# Patient Record
Sex: Female | Born: 1955 | Race: White | Hispanic: No | Smoking: Never smoker
Health system: Southern US, Community
[De-identification: ages and names within clinical notes are randomized; demographics above are authoritative.]

## PROBLEM LIST (undated history)

## (undated) DIAGNOSIS — K621 Rectal polyp: Secondary | ICD-10-CM

## (undated) DIAGNOSIS — L570 Actinic keratosis: Secondary | ICD-10-CM

## (undated) DIAGNOSIS — S92321A Displaced fracture of second metatarsal bone, right foot, initial encounter for closed fracture: Secondary | ICD-10-CM

## (undated) DIAGNOSIS — C4491 Basal cell carcinoma of skin, unspecified: Secondary | ICD-10-CM

## (undated) DIAGNOSIS — K589 Irritable bowel syndrome without diarrhea: Secondary | ICD-10-CM

## (undated) DIAGNOSIS — G35 Multiple sclerosis: Secondary | ICD-10-CM

## (undated) DIAGNOSIS — S92331D Displaced fracture of third metatarsal bone, right foot, subsequent encounter for fracture with routine healing: Secondary | ICD-10-CM

## (undated) DIAGNOSIS — N390 Urinary tract infection, site not specified: Secondary | ICD-10-CM

## (undated) DIAGNOSIS — N319 Neuromuscular dysfunction of bladder, unspecified: Secondary | ICD-10-CM

## (undated) DIAGNOSIS — C4492 Squamous cell carcinoma of skin, unspecified: Secondary | ICD-10-CM

## (undated) HISTORY — PX: BREAST BIOPSY: SHX20

## (undated) HISTORY — DX: Irritable bowel syndrome without diarrhea: K58.9

## (undated) HISTORY — DX: Basal cell carcinoma of skin, unspecified: C44.91

## (undated) HISTORY — PX: SKIN CANCER EXCISION: SHX779

## (undated) HISTORY — DX: Neuromuscular dysfunction of bladder, unspecified: N31.9

## (undated) HISTORY — DX: Urinary tract infection, site not specified: N39.0

## (undated) HISTORY — DX: Actinic keratosis: L57.0

## (undated) HISTORY — PX: BOTOX INJECTION: SHX5754

## (undated) HISTORY — PX: COLONOSCOPY: SHX174

## (undated) HISTORY — DX: Displaced fracture of second metatarsal bone, right foot, initial encounter for closed fracture: S92.321A

## (undated) HISTORY — DX: Displaced fracture of third metatarsal bone, right foot, subsequent encounter for fracture with routine healing: S92.331D

## (undated) HISTORY — DX: Squamous cell carcinoma of skin, unspecified: C44.92

## (undated) HISTORY — DX: Rectal polyp: K62.1

---

## 2011-05-21 DIAGNOSIS — K589 Irritable bowel syndrome without diarrhea: Secondary | ICD-10-CM | POA: Insufficient documentation

## 2013-05-23 DIAGNOSIS — E785 Hyperlipidemia, unspecified: Secondary | ICD-10-CM | POA: Insufficient documentation

## 2018-06-03 DIAGNOSIS — N319 Neuromuscular dysfunction of bladder, unspecified: Secondary | ICD-10-CM | POA: Insufficient documentation

## 2019-07-29 DIAGNOSIS — Z7409 Other reduced mobility: Secondary | ICD-10-CM | POA: Insufficient documentation

## 2019-07-29 DIAGNOSIS — I10 Essential (primary) hypertension: Secondary | ICD-10-CM | POA: Insufficient documentation

## 2019-07-29 DIAGNOSIS — Z789 Other specified health status: Secondary | ICD-10-CM | POA: Insufficient documentation

## 2019-10-01 ENCOUNTER — Encounter (HOSPITAL_BASED_OUTPATIENT_CLINIC_OR_DEPARTMENT_OTHER): Payer: Self-pay | Admitting: Emergency Medicine

## 2019-10-01 ENCOUNTER — Emergency Department (HOSPITAL_BASED_OUTPATIENT_CLINIC_OR_DEPARTMENT_OTHER): Payer: Medicare Other

## 2019-10-01 ENCOUNTER — Other Ambulatory Visit: Payer: Self-pay

## 2019-10-01 ENCOUNTER — Emergency Department (HOSPITAL_BASED_OUTPATIENT_CLINIC_OR_DEPARTMENT_OTHER)
Admission: EM | Admit: 2019-10-01 | Discharge: 2019-10-02 | Disposition: A | Discharge status: Other Acute Inpatient Hospital | Payer: Medicare Other | Attending: Emergency Medicine | Admitting: Emergency Medicine

## 2019-10-01 DIAGNOSIS — R000 Tachycardia, unspecified: Secondary | ICD-10-CM | POA: Diagnosis not present

## 2019-10-01 DIAGNOSIS — G35 Multiple sclerosis: Secondary | ICD-10-CM | POA: Diagnosis not present

## 2019-10-01 DIAGNOSIS — R112 Nausea with vomiting, unspecified: Secondary | ICD-10-CM | POA: Diagnosis not present

## 2019-10-01 DIAGNOSIS — Z20822 Contact with and (suspected) exposure to covid-19: Secondary | ICD-10-CM | POA: Insufficient documentation

## 2019-10-01 DIAGNOSIS — R69 Illness, unspecified: Secondary | ICD-10-CM

## 2019-10-01 DIAGNOSIS — N218 Other lower urinary tract calculus: Secondary | ICD-10-CM | POA: Diagnosis not present

## 2019-10-01 DIAGNOSIS — A419 Sepsis, unspecified organism: Secondary | ICD-10-CM | POA: Diagnosis not present

## 2019-10-01 DIAGNOSIS — R350 Frequency of micturition: Secondary | ICD-10-CM | POA: Diagnosis present

## 2019-10-01 DIAGNOSIS — N390 Urinary tract infection, site not specified: Secondary | ICD-10-CM

## 2019-10-01 HISTORY — DX: Multiple sclerosis: G35

## 2019-10-01 LAB — COMPREHENSIVE METABOLIC PANEL
ALT: 15 U/L (ref 0–44)
AST: 18 U/L (ref 15–41)
Albumin: 3.7 g/dL (ref 3.5–5.0)
Alkaline Phosphatase: 52 U/L (ref 38–126)
Anion gap: 12 (ref 5–15)
BUN: 10 mg/dL (ref 8–23)
CO2: 25 mmol/L (ref 22–32)
Calcium: 9.4 mg/dL (ref 8.9–10.3)
Chloride: 106 mmol/L (ref 98–111)
Creatinine, Ser: 0.88 mg/dL (ref 0.44–1.00)
GFR calc Af Amer: >60 mL/min (ref >60)
GFR calc non Af Amer: >60 mL/min (ref >60)
Glucose, Bld: 116 mg/dL — ABNORMAL HIGH (ref 70–99)
Potassium: 3.4 mmol/L — ABNORMAL LOW (ref 3.5–5.1)
Sodium: 143 mmol/L (ref 135–145)
Total Bilirubin: 0.7 mg/dL (ref 0.3–1.2)
Total Protein: 7.1 g/dL (ref 6.5–8.1)

## 2019-10-01 LAB — CBC WITH DIFFERENTIAL/PLATELET
Abs Immature Granulocytes: 0.12 10*3/uL — ABNORMAL HIGH (ref 0.00–0.07)
Basophils Absolute: 0.1 10*3/uL (ref 0.0–0.1)
Basophils Relative: 0 %
Eosinophils Absolute: 0.1 10*3/uL (ref 0.0–0.5)
Eosinophils Relative: 0 %
HCT: 37.8 % (ref 36.0–46.0)
Hemoglobin: 11.7 g/dL — ABNORMAL LOW (ref 12.0–15.0)
Immature Granulocytes: 1 %
Lymphocytes Relative: 4 %
Lymphs Abs: 0.8 10*3/uL (ref 0.7–4.0)
MCH: 26.7 pg (ref 26.0–34.0)
MCHC: 31 g/dL (ref 30.0–36.0)
MCV: 86.3 fL (ref 80.0–100.0)
Monocytes Absolute: 1.7 10*3/uL — ABNORMAL HIGH (ref 0.1–1.0)
Monocytes Relative: 8 %
Neutro Abs: 18.9 10*3/uL — ABNORMAL HIGH (ref 1.7–7.7)
Neutrophils Relative %: 87 %
Platelets: 335 10*3/uL (ref 150–400)
RBC: 4.38 MIL/uL (ref 3.87–5.11)
RDW: 15.8 % — ABNORMAL HIGH (ref 11.5–15.5)
WBC: 21.6 10*3/uL — ABNORMAL HIGH (ref 4.0–10.5)
nRBC: 0 % (ref 0.0–0.2)

## 2019-10-01 LAB — PROTIME-INR
INR: 0.9 (ref 0.8–1.2)
Prothrombin Time: 12.5 s (ref 11.4–15.2)

## 2019-10-01 LAB — LACTIC ACID, PLASMA: Lactic Acid, Venous: 1.2 mmol/L (ref 0.5–1.9)

## 2019-10-01 LAB — APTT: aPTT: 28 seconds (ref 24–36)

## 2019-10-01 LAB — BRAIN NATRIURETIC PEPTIDE: B Natriuretic Peptide: 169 pg/mL — ABNORMAL HIGH (ref 0.0–100.0)

## 2019-10-01 LAB — LIPASE, BLOOD: Lipase: 38 U/L (ref 11–51)

## 2019-10-01 LAB — TROPONIN I (HIGH SENSITIVITY): Troponin I (High Sensitivity): 4 ng/L (ref <18)

## 2019-10-01 MED ORDER — LACTATED RINGERS IV BOLUS (SEPSIS): 1000.0000 mL | Freq: Once | INTRAVENOUS | Status: DC

## 2019-10-01 MED ORDER — ACETAMINOPHEN 650 MG RE SUPP
650.0000 mg | Freq: Once | RECTAL | Status: AC
  Administered 2019-10-01: 650 mg via RECTAL
  Filled 2019-10-01: qty 1

## 2019-10-01 MED ORDER — LACTATED RINGERS IV BOLUS (SEPSIS)
1000.0000 mL | Freq: Once | INTRAVENOUS | Status: AC
  Administered 2019-10-02: 01:00:00 1000 mL via INTRAVENOUS

## 2019-10-01 MED ORDER — LACTATED RINGERS IV BOLUS
1000.0000 mL | Freq: Once | INTRAVENOUS | Status: AC
  Administered 2019-10-01: 23:00:00 1000 mL via INTRAVENOUS

## 2019-10-01 MED ORDER — LACTATED RINGERS IV BOLUS (SEPSIS): 250.0000 mL | Freq: Once | INTRAVENOUS | Status: DC

## 2019-10-01 MED ORDER — SODIUM CHLORIDE 0.9 % IV SOLN
1.0000 g | Freq: Once | INTRAVENOUS | Status: AC
  Administered 2019-10-01: 1 g via INTRAVENOUS
  Filled 2019-10-01: qty 10

## 2019-10-01 NOTE — ED Provider Notes (Signed)
[facility] EMERGENCY DEPARTMENT Provider Note   CSN: [id number] Arrival date & time: 10/01/19  2041     History Chief Complaint  Patient presents with  . Emesis  . Urinary Frequency    Renee Huang is a 64 y.o. female.  HPI Patient is a resident at [facility].  She has multiple sclerosis.  Patient reports she has had urinary tract infections in the past and had similar symptoms.  She reports she started having a lot of urinary frequency and incontinence.  She reports she was waking up in pools of urine.  She reports she has had nausea and vomiting with general weakness and back pain.  She denies cough or shortness of breath.  She reports that multiple residents at the facility had fever and cough but to this point, they have tested negative for Covid.  She reports she had a negative Covid test within the past 2 weeks.    Past Medical History:  Diagnosis Date  . Multiple sclerosis ([place])     There are no problems to display for this patient.      OB History   No obstetric history on file.     No family history on file.  Social History   Tobacco Use  . Smoking status: Never Smoker  Substance Use Topics  . Alcohol use: Yes  . Drug use: Never    Home Medications Prior to Admission medications   Not on File    Allergies    Patient has no allergy information on record.  Review of Systems   Review of Systems 10 Systems reviewed and are negative for acute change except as noted in the HPI.  Physical Exam Updated Vital Signs BP (!) 180/98   Pulse (!) 116   Temp (!) 101.2 F (38.4 C) (Oral)   Resp (!) 27   Ht 5\' 5"  (1.651 m)   Wt 72.6 kg   SpO2 93%   BMI 26.63 kg/m   Physical Exam Constitutional:      Comments: Patient is alert.  No respiratory distress.  She is ill and uncomfortable in appearance.  She is holding an emesis bag and intermittently has dry heaves.  HENT:     Head: Normocephalic and atraumatic.   Mouth/Throat:     Mouth: Mucous membranes are moist.     Pharynx: Oropharynx is clear.  Eyes:     Extraocular Movements: Extraocular movements intact.     Conjunctiva/sclera: Conjunctivae normal.  Cardiovascular:     Comments: Tachycardia.  No gross rub murmur gallop. Pulmonary:     Comments: No respiratory distress.  Lungs are grossly clear. Abdominal:     Comments: Abdomen is soft.  Patient denies significant pain to palpation.  No guarding.  Musculoskeletal:     Comments: Patient does not have any significant peripheral edema.  Her feet are warm and dry.  Dorsalis pedis pulses are 2+.  Skin condition of the feet and legs is good.  She does not have any wounds or breakdown.  Skin:    General: Skin is warm and dry.  Neurological:     Comments: Alert and oriented x3.  Patient has quite a bit of muscle stiffness at this point.  She is very stiff for even sitting forward.  Her legs are quite stiff and difficult to raise.  She reports this is partially the effect of not having had baclofen for several days.  Psychiatric:        Mood and Affect:  Mood normal.     ED Results / Procedures / Treatments   Labs (all labs ordered are listed, but only abnormal results are displayed) Labs Reviewed  COMPREHENSIVE METABOLIC PANEL - Abnormal; Notable for the following components:      Result Value   Potassium 3.4 (*)    Glucose, Bld 116 (*)    All other components within normal limits  BRAIN NATRIURETIC PEPTIDE - Abnormal; Notable for the following components:   B Natriuretic Peptide 169.0 (*)    All other components within normal limits  CBC WITH DIFFERENTIAL/PLATELET - Abnormal; Notable for the following components:   WBC 21.6 (*)    Hemoglobin 11.7 (*)    RDW 15.8 (*)    Neutro Abs 18.9 (*)    Monocytes Absolute 1.7 (*)    Abs Immature Granulocytes 0.12 (*)    All other components within normal limits  CULTURE, BLOOD (ROUTINE X 2)  CULTURE, BLOOD (ROUTINE X 2)  SARS CORONAVIRUS 2 AG  (30 MIN TAT)  LIPASE, BLOOD  LACTIC ACID, PLASMA  PROTIME-INR  LACTIC ACID, PLASMA  URINALYSIS, ROUTINE W REFLEX MICROSCOPIC  APTT  TROPONIN I (HIGH SENSITIVITY)    EKG None EKG is not imported into MUSE. Sinus rhythm 116.  PR 131.  QRS 128.  QTc 481. Right bundle branch block.  No STEMI.  No old comparison. Radiology DG Chest Port 1 View  Result Date: 10/01/2019 CLINICAL DATA:  Back pain, nausea and vomiting, weakness, hypoxia EXAM: PORTABLE CHEST 1 VIEW COMPARISON:  07/23/2019 FINDINGS: Single frontal view of the chest demonstrates a stable cardiac silhouette. No airspace disease, effusion, or pneumothorax. No acute bony abnormalities. IMPRESSION: 1. Stable exam, no acute process. Electronically Signed   By: Randa Ngo M.D.   On: 10/01/2019 22:24    Procedures Procedures (including critical care time) CRITICAL CARE Performed by: Charlesetta Shanks   Total critical care time: 30 minutes  Critical care time was exclusive of separately billable procedures and treating other patients.  Critical care was necessary to treat or prevent imminent or life-threatening deterioration.  Critical care was time spent personally by me on the following activities: development of treatment plan with patient and/or surrogate as well as nursing, discussions with consultants, evaluation of patient's response to treatment, examination of patient, obtaining history from patient or surrogate, ordering and performing treatments and interventions, ordering and review of laboratory studies, ordering and review of radiographic studies, pulse oximetry and re-evaluation of patient's condition. Medications Ordered in ED Medications  cefTRIAXone (ROCEPHIN) 1 g in sodium chloride 0.9 % 100 mL IVPB (has no administration in time range)  acetaminophen (TYLENOL) suppository 650 mg (has no administration in time range)  lactated ringers bolus 1,000 mL (has no administration in time range)    And  lactated  ringers bolus 1,000 mL (has no administration in time range)    And  lactated ringers bolus 250 mL (has no administration in time range)  lactated ringers bolus 1,000 mL (1,000 mLs Intravenous New Bag/Given 10/01/19 2259)    ED Course  I have reviewed the triage vital signs and the nursing notes.  Pertinent labs & imaging results that were available during my care of the patient were reviewed by me and considered in my medical decision making (see chart for details).    MDM Rules/Calculators/A&P                      Patient presents lying above.  She is febrile.  She  has tachycardia.  Significant leukocytosis.  Chest x-ray is clear.  Urinalysis is pending.  Nursing staff is having to obtain via cath.  I will empirically start Rocephin with very high probability of UTI.  Will initiate sepsis protocol.  Patient will need to be admitted.  Covid testing is pending at this time.  Patient does not have chest congestion or pneumonia by chest x-ray.  Lower suspicion for Covid at this time.  She is however in a congregate living situation. Final Clinical Impression(s) / ED Diagnoses Final diagnoses:  Sepsis without acute organ dysfunction, due to unspecified organism [facility])  Severe comorbid illness    Rx / DC Orders ED Discharge Orders    None       Charlesetta Shanks, MD 10/01/19 2320

## 2019-10-01 NOTE — ED Triage Notes (Addendum)
Pt arrives via EMS from [facility]  c/o back pain, N/V, weakness and urinary frequency x 2 days. Pt reports hx of uti.

## 2019-10-01 NOTE — ED Notes (Signed)
ED Provider at bedside. Dr. Zoe Lan

## 2019-10-02 LAB — URINALYSIS, ROUTINE W REFLEX MICROSCOPIC
Bilirubin Urine: NEGATIVE
Glucose, UA: NEGATIVE mg/dL
Ketones, ur: NEGATIVE mg/dL
Nitrite: NEGATIVE
Protein, ur: NEGATIVE mg/dL
Specific Gravity, Urine: 1.01 (ref 1.005–1.030)
pH: 6.5 (ref 5.0–8.0)

## 2019-10-02 LAB — SARS CORONAVIRUS 2 AG (30 MIN TAT): SARS Coronavirus 2 Ag: NEGATIVE

## 2019-10-02 LAB — HEPATIC FUNCTION PANEL
ALT: 10 (ref 7–35)
AST: 14 (ref 13–35)
Alkaline Phosphatase: 46 (ref 25–125)
Bilirubin, Direct: 0.1 (ref 0.01–0.4)
Bilirubin, Total: 0.5

## 2019-10-02 LAB — TROPONIN I (HIGH SENSITIVITY): Troponin I (High Sensitivity): 9 ng/L (ref <18)

## 2019-10-02 LAB — URINALYSIS, MICROSCOPIC (REFLEX)

## 2019-10-02 LAB — COMPREHENSIVE METABOLIC PANEL: Albumin: 3.6 (ref 3.5–5.0)

## 2019-10-02 LAB — SARS CORONAVIRUS 2 BY RT PCR (HOSPITAL ORDER, PERFORMED IN ~~LOC~~ HOSPITAL LAB): SARS Coronavirus 2: NEGATIVE

## 2019-10-02 LAB — LACTIC ACID, PLASMA: Lactic Acid, Venous: 1.4 mmol/L (ref 0.5–1.9)

## 2019-10-02 MED ORDER — DIAZEPAM 5 MG/ML IJ SOLN
5.0000 mg | Freq: Once | INTRAMUSCULAR | Status: AC
  Administered 2019-10-02: 02:00:00 5 mg via INTRAVENOUS
  Filled 2019-10-02: qty 2

## 2019-10-02 NOTE — ED Notes (Signed)
[facility] notified (Tammy) - patient ready for transport  Contacted [facility] for transport for patient

## 2019-10-02 NOTE — ED Notes (Signed)
Pt transported to [facility] by way of [facility]. RN Reginal at [facility] called to inform her that the pt is on her way.

## 2019-10-02 NOTE — ED Provider Notes (Addendum)
Nursing notes and vitals signs, including pulse oximetry, reviewed.  Summary of this visit's results, reviewed by myself:  EKG:  EKG Interpretation  Date/Time:  Saturday October 01 2019 22:50:46 EST Ventricular Rate:  116 PR Interval:    QRS Duration: 128 QT Interval:  346 QTC Calculation: 481 R Axis:   16 Text Interpretation: Sinus tachycardia Probable left atrial enlargement Right bundle branch block No previous ECGs available Confirmed by Jolyne Laye, Jenny Reichmann [phone]) on 10/02/2019 2:34:11 AM       Labs:  Results for orders placed or performed during the hospital encounter of 10/01/19 (from the past 24 hour(s))  Comprehensive metabolic panel     Status: Abnormal   Collection Time: 10/01/19  9:51 PM  Result Value Ref Range   Sodium 143 135 - 145 mmol/L   Potassium 3.4 (L) 3.5 - 5.1 mmol/L   Chloride 106 98 - 111 mmol/L   CO2 25 22 - 32 mmol/L   Glucose, Bld 116 (H) 70 - 99 mg/dL   BUN 10 8 - 23 mg/dL   Creatinine, Ser 0.88 0.44 - 1.00 mg/dL   Calcium 9.4 8.9 - 10.3 mg/dL   Total Protein 7.1 6.5 - 8.1 g/dL   Albumin 3.7 3.5 - 5.0 g/dL   AST 18 15 - 41 U/L   ALT 15 0 - 44 U/L   Alkaline Phosphatase 52 38 - 126 U/L   Total Bilirubin 0.7 0.3 - 1.2 mg/dL   GFR calc non Af Amer >60 >60 mL/min   GFR calc Af Amer >60 >60 mL/min   Anion gap 12 5 - 15  Lipase, blood     Status: None   Collection Time: 10/01/19  9:51 PM  Result Value Ref Range   Lipase 38 11 - 51 U/L  CBC with Differential     Status: Abnormal   Collection Time: 10/01/19  9:51 PM  Result Value Ref Range   WBC 21.6 (H) 4.0 - 10.5 K/uL   RBC 4.38 3.87 - 5.11 MIL/uL   Hemoglobin 11.7 (L) 12.0 - 15.0 g/dL   HCT 37.8 36.0 - 46.0 %   MCV 86.3 80.0 - 100.0 fL   MCH 26.7 26.0 - 34.0 pg   MCHC 31.0 30.0 - 36.0 g/dL   RDW 15.8 (H) 11.5 - 15.5 %   Platelets 335 150 - 400 K/uL   nRBC 0.0 0.0 - 0.2 %   Neutrophils Relative % 87 %   Neutro Abs 18.9 (H) 1.7 - 7.7 K/uL   Lymphocytes Relative 4 %   Lymphs Abs 0.8 0.7 - 4.0  K/uL   Monocytes Relative 8 %   Monocytes Absolute 1.7 (H) 0.1 - 1.0 K/uL   Eosinophils Relative 0 %   Eosinophils Absolute 0.1 0.0 - 0.5 K/uL   Basophils Relative 0 %   Basophils Absolute 0.1 0.0 - 0.1 K/uL   Immature Granulocytes 1 %   Abs Immature Granulocytes 0.12 (H) 0.00 - 0.07 K/uL  Protime-INR     Status: None   Collection Time: 10/01/19  9:51 PM  Result Value Ref Range   Prothrombin Time 12.5 11.4 - 15.2 seconds   INR 0.9 0.8 - 1.2  APTT     Status: None   Collection Time: 10/01/19  9:51 PM  Result Value Ref Range   aPTT 28 24 - 36 seconds  Brain natriuretic peptide     Status: Abnormal   Collection Time: 10/01/19  9:52 PM  Result Value Ref Range   B Natriuretic  Peptide 169.0 (H) 0.0 - 100.0 pg/mL  Troponin I (High Sensitivity)     Status: None   Collection Time: 10/01/19  9:52 PM  Result Value Ref Range   Troponin I (High Sensitivity) 4 <18 ng/L  Lactic acid, plasma     Status: None   Collection Time: 10/01/19  9:52 PM  Result Value Ref Range   Lactic Acid, Venous 1.2 0.5 - 1.9 mmol/L  SARS Coronavirus 2 Ag (30 min TAT) - Nasal Swab (BD Veritor Kit)     Status: None   Collection Time: 10/01/19 11:50 PM   Specimen: Nasal Swab (BD Veritor Kit)  Result Value Ref Range   SARS Coronavirus 2 Ag NEGATIVE NEGATIVE  Urinalysis, Routine w reflex microscopic     Status: Abnormal   Collection Time: 10/01/19 11:58 PM  Result Value Ref Range   Color, Urine YELLOW YELLOW   APPearance CLOUDY (A) CLEAR   Specific Gravity, Urine 1.010 1.005 - 1.030   pH 6.5 5.0 - 8.0   Glucose, UA NEGATIVE NEGATIVE mg/dL   Hgb urine dipstick TRACE (A) NEGATIVE   Bilirubin Urine NEGATIVE NEGATIVE   Ketones, ur NEGATIVE NEGATIVE mg/dL   Protein, ur NEGATIVE NEGATIVE mg/dL   Nitrite NEGATIVE NEGATIVE   Leukocytes,Ua TRACE (A) NEGATIVE  Urinalysis, Microscopic (reflex)     Status: Abnormal   Collection Time: 10/01/19 11:58 PM  Result Value Ref Range   RBC / HPF 0-5 0 - 5 RBC/hpf   WBC, UA  11-20 0 - 5 WBC/hpf   Bacteria, UA MANY (A) NONE SEEN   Squamous Epithelial / LPF 0-5 0 - 5  Lactic acid, plasma     Status: None   Collection Time: 10/02/19 12:48 AM  Result Value Ref Range   Lactic Acid, Venous 1.4 0.5 - 1.9 mmol/L  Troponin I (High Sensitivity)     Status: None   Collection Time: 10/02/19 12:48 AM  Result Value Ref Range   Troponin I (High Sensitivity) 9 <18 ng/L  SARS Coronavirus 2 by RT PCR (hospital order, performed in [place] hospital lab)     Status: None   Collection Time: 10/02/19  1:20 AM  Result Value Ref Range   SARS Coronavirus 2 NEGATIVE NEGATIVE    Imaging Studies: DG Chest Port 1 View  Result Date: 10/01/2019 CLINICAL DATA:  Back pain, nausea and vomiting, weakness, hypoxia EXAM: PORTABLE CHEST 1 VIEW COMPARISON:  07/23/2019 FINDINGS: Single frontal view of the chest demonstrates a stable cardiac silhouette. No airspace disease, effusion, or pneumothorax. No acute bony abnormalities. IMPRESSION: 1. Stable exam, no acute process. Electronically Signed   By: Randa Ngo M.D.   On: 10/01/2019 22:24   2:31 AM Dr. Dwyane Dee accepts patient for admission to [facility].    [place], Jenny Reichmann, MD 10/02/19 0232    Shanon Rosser, MD 10/02/19 [phone]

## 2019-10-03 LAB — URINE CULTURE

## 2019-10-07 LAB — CULTURE, BLOOD (ROUTINE X 2)
Culture: NO GROWTH
Culture: NO GROWTH
Special Requests: ADEQUATE

## 2019-10-10 LAB — BASIC METABOLIC PANEL
BUN: 18 (ref 4–21)
CO2: 30 — AB (ref 13–22)
Chloride: 102 (ref 99–108)
Creatinine: 0.8 (ref 0.5–1.1)
Potassium: 5 (ref 3.4–5.3)
Sodium: 140 (ref 137–147)

## 2019-10-10 LAB — CBC AND DIFFERENTIAL
HCT: 40 (ref 36–46)
Hemoglobin: 13.1 (ref 12.0–16.0)
Platelets: 391 (ref 150–399)
WBC: 9.8

## 2019-10-10 LAB — CBC: RBC: 4.84 (ref 3.87–5.11)

## 2019-10-10 LAB — COMPREHENSIVE METABOLIC PANEL: Calcium: 9.4 (ref 8.7–10.7)

## 2019-10-11 ENCOUNTER — Non-Acute Institutional Stay (SKILLED_NURSING_FACILITY): Payer: Medicare Other | Admitting: Internal Medicine

## 2019-10-11 ENCOUNTER — Encounter: Payer: Self-pay | Admitting: Internal Medicine

## 2019-10-11 DIAGNOSIS — G35D Multiple sclerosis, unspecified: Secondary | ICD-10-CM

## 2019-10-11 DIAGNOSIS — E8779 Other fluid overload: Secondary | ICD-10-CM

## 2019-10-11 DIAGNOSIS — G35 Multiple sclerosis: Secondary | ICD-10-CM

## 2019-10-11 DIAGNOSIS — F32A Depression, unspecified: Secondary | ICD-10-CM

## 2019-10-11 DIAGNOSIS — F329 Major depressive disorder, single episode, unspecified: Secondary | ICD-10-CM

## 2019-10-11 DIAGNOSIS — A0472 Enterocolitis due to Clostridium difficile, not specified as recurrent: Secondary | ICD-10-CM | POA: Diagnosis not present

## 2019-10-11 DIAGNOSIS — A419 Sepsis, unspecified organism: Secondary | ICD-10-CM

## 2019-10-11 DIAGNOSIS — K580 Irritable bowel syndrome with diarrhea: Secondary | ICD-10-CM

## 2019-10-11 DIAGNOSIS — N390 Urinary tract infection, site not specified: Secondary | ICD-10-CM

## 2019-10-11 DIAGNOSIS — I10 Essential (primary) hypertension: Secondary | ICD-10-CM

## 2019-10-11 DIAGNOSIS — J90 Pleural effusion, not elsewhere classified: Secondary | ICD-10-CM

## 2019-10-11 DIAGNOSIS — E785 Hyperlipidemia, unspecified: Secondary | ICD-10-CM

## 2019-10-11 NOTE — Progress Notes (Signed)
: Provider:  Hennie Duos., MD Location:  [facility] Room Number: 507-P Place of Service:  SNF (31)  PCP: Patient, No Pcp Per Patient Care Team: Patient, No Pcp Per as PCP - General (General Practice)  Extended Emergency Contact Information Primary Emergency Contact: [name] Mobile Phone: [phone] Relation: Other Secondary Emergency Contact: [name] Mobile Phone: [phone] Relation: Other     Allergies: Patient has no known allergies.  Chief Complaint  Patient presents with  . New Admit To SNF    New admission to [facility] SNF    HPI: Patient is a 64 y.o. female with MS, neurogenic bladder, irritable bowel syndrome who is wheelchair-bound at baseline with history of recurrent UTI infection of C. difficile in January 2021 presented to [facility] with nausea vomiting diarrhea and fever 101.9 with urinary frequency since Friday 2/12.  Denied chills diaphoresis rigors abdominal pain cough shortness of breath.  Patient was admitted to [facility] from 2/14-22 patient was started on broad-spectrum IV antibiotics and p.o. vancomycin and C. difficile was positive.  Infectious disease was consulted and IV antibiotics were discontinued and transition to p.o. Macrobid bid.  CT of the abdomen pelvis did not show any significant colonic wall thickening, they resolved and her white counts were normal.  Macrobid was discontinued on 2/19 patient was continued on p.o. vancomycin 500 mg every 6 hours and was closely followed by infectious disease.  Hospital course was complicated fluid overload chest x-ray suggestive of interstitial edema and left-sided pleural effusion which was tapped was found to be in transudate.  2D echo done showed EF of 65% patient diuresed well and her fluid overload resolved.  Repeat-chest x-ray was normal and patient was weaned off of O2 and was saturating well on room air.  Patient's  potassium and magnesium was low and was repleted and her blood pressure was high and she was started on Norvasc.  Patient is admitted to skilled nursing facility for OT/PT.  While at skilled nursing facility patient will be followed for IBS treated with Bentyl, depression treated with Prozac and MS treated with ocrelizumab.  Past Medical History:  Diagnosis Date  . Actinic keratosis   . BCC (basal cell carcinoma of skin)   . Closed fracture of second metatarsal bone of right foot   . Closed fracture of third metatarsal bone of right foot with routine healing   . IBS (irritable bowel syndrome)   . Multiple sclerosis ([place])   . Neurogenic bladder   . Rectal polyp    Tubular adenoma  . Recurrent UTI   . SCC (squamous cell carcinoma)     Past Surgical History:  Procedure Laterality Date  . BOTOX INJECTION    . BREAST BIOPSY    . COLONOSCOPY    . SKIN CANCER EXCISION      Allergies as of 10/11/2019   No Known Allergies     Medication List       Accurate as of October 11, 2019  9:41 AM. If you have any questions, ask your nurse or doctor.        acetaminophen 325 MG tablet Commonly known as: TYLENOL Take 325 mg by mouth every 4 (four) hours as needed.   amLODipine 5 MG tablet Commonly known as: NORVASC Take 5 mg by mouth daily.   baclofen 10 MG tablet Commonly known as: LIORESAL Take 30 mg by mouth 3 (three) times daily. Take 3 tablets to =  30 mg   baclofen 20 MG tablet Commonly known as: LIORESAL Take 20 mg by mouth 3 (three) times daily.   cholecalciferol 25 MCG (1000 UNIT) tablet Commonly known as: VITAMIN D Take 1,000 Units by mouth daily.   Culturelle Caps Take 1 capsule by mouth daily. 10B cell   dicyclomine 20 MG tablet Commonly known as: BENTYL Take 20 mg by mouth in the morning, at noon, in the evening, and at bedtime.   Firvanq 50 MG/ML Solr Generic drug: Vancomycin HCl Take 5 mLs by mouth every 6 (six) hours.   FLUoxetine 20 MG capsule Commonly  known as: PROZAC Take 20 mg by mouth daily.   metoprolol succinate 25 MG 24 hr tablet Commonly known as: TOPROL-XL Take 25 mg by mouth daily.   nystatin ointment Commonly known as: MYCOSTATIN Apply 1 application topically 3 (three) times daily.   Ocrevus 300 MG/10ML injection Generic drug: ocrelizumab Inject 600 mg into the vein every 6 (six) months.   pravastatin 40 MG tablet Commonly known as: PRAVACHOL Take 40 mg by mouth daily.   tiZANidine 2 MG tablet Commonly known as: ZANAFLEX Take 2 mg by mouth at bedtime.   vitamin B-12 1000 MCG tablet Commonly known as: CYANOCOBALAMIN Take 1,000 mcg by mouth daily.       No orders of the defined types were placed in this encounter.   Immunization History  Administered Date(s) Administered  . Influenza, High Dose Seasonal PF 07/01/2018  . Influenza,inj,Quad PF,6+ Mos 05/20/2013, 05/26/2014, 06/18/2015, 06/18/2016, 06/29/2017, 06/20/2019  . Influenza-Unspecified 06/20/2019  . PPD Test 08/23/2019  . Td 08/18/1998  . Tdap 03/11/2012    Social History   Tobacco Use  . Smoking status: Never Smoker  Substance Use Topics  . Alcohol use: Yes    Family history is   Family History  Problem Relation Age of Onset  . Hypertension Mother   . Hyperlipidemia Mother   . Breast cancer Mother   . Hypertension Father   . Heart attack Father   . Hypertension Brother   . Diabetes Brother   . Lung cancer Maternal Grandmother   . Heart attack Paternal Grandfather       Review of Systems  GENERAL:  no fevers, fatigue, appetite changes SKIN: No itching, or rash EYES: No eye pain, redness, discharge EARS: No earache, tinnitus, change in hearing NOSE: No congestion, drainage or bleeding  MOUTH/THROAT: No mouth or tooth pain, No sore throat RESPIRATORY: No cough, wheezing, SOB CARDIAC: No chest pain, palpitations, lower extremity edema  GI: No abdominal pain, No N/V/D or constipation, No heartburn or reflux  GU: No dysuria,  frequency or urgency, or incontinence  MUSCULOSKELETAL: No unrelieved bone/joint pain NEUROLOGIC: No headache, dizziness or focal weakness PSYCHIATRIC: No c/o anxiety or sadness   Vitals:   10/11/19 0848  BP: 136/78  Pulse: 83  Resp: 18  Temp: (!) 97.3 F (36.3 C)  SpO2: 96%    SpO2 Readings from Last 1 Encounters:  10/11/19 96%   Body mass index is 26.63 kg/m.     Physical Exam  GENERAL APPEARANCE: Alert, conversant,  No acute distress.  SKIN: No diaphoresis rash HEAD: Normocephalic, atraumatic  EYES: Conjunctiva/lids clear. Pupils round, reactive. EOMs intact.  EARS: External exam WNL, canals clear. Hearing grossly normal.  NOSE: No deformity or discharge.  MOUTH/THROAT: Lips w/o lesions  RESPIRATORY: Breathing is even, unlabored. Lung sounds are clear   CARDIOVASCULAR: Heart RRR no murmurs, rubs or gallops. No peripheral edema.   GASTROINTESTINAL:  Abdomen is soft, non-tender, not distended w/ normal bowel sounds. GENITOURINARY: Bladder non tender, not distended  MUSCULOSKELETAL: No abnormal joints or musculature NEUROLOGIC:  Cranial nerves 2-12 grossly intact. Moves all extremities  PSYCHIATRIC: Mood and affect appropriate to situation, no behavioral issues  There are no problems to display for this patient.     Labs reviewed: Basic Metabolic Panel:    Component Value Date/Time   NA 140 10/10/2019 0000   K 5.0 10/10/2019 0000   CL 102 10/10/2019 0000   CO2 30 (A) 10/10/2019 0000   GLUCOSE 116 (H) 10/01/2019 2151   BUN 18 10/10/2019 0000   CREATININE 0.8 10/10/2019 0000   CREATININE 0.88 10/01/2019 2151   CALCIUM 9.4 10/10/2019 0000   PROT 7.1 10/01/2019 2151   ALBUMIN 3.6 10/02/2019 0000   AST 14 10/02/2019 0000   ALT 10 10/02/2019 0000   ALKPHOS 46 10/02/2019 0000   BILITOT 0.7 10/01/2019 2151   GFRNONAA >60 10/01/2019 2151   GFRAA >60 10/01/2019 2151    Recent Labs    10/01/19 2151 10/10/19 0000  NA 143 140  K 3.4* 5.0  CL 106 102  CO2  25 30*  GLUCOSE 116*  --   BUN 10 18  CREATININE 0.88 0.8  CALCIUM 9.4 9.4   Liver Function Tests: Recent Labs    10/01/19 2151 10/02/19 0000  AST 18 14  ALT 15 10  ALKPHOS 52 46  BILITOT 0.7  --   PROT 7.1  --   ALBUMIN 3.7 3.6   Recent Labs    10/01/19 2151  LIPASE 38   No results for input(s): AMMONIA in the last 8760 hours. CBC: Recent Labs    10/01/19 2151 10/10/19 0000  WBC 21.6* 9.8  NEUTROABS 18.9*  --   HGB 11.7* 13.1  HCT 37.8 40  MCV 86.3  --   PLT 335 391   Lipid No results for input(s): CHOL, HDL, LDLCALC, TRIG in the last 8760 hours.  Cardiac Enzymes: No results for input(s): CKTOTAL, CKMB, CKMBINDEX, TROPONINI in the last 8760 hours. BNP: Recent Labs    10/01/19 2152  BNP 169.0*   No results found for: MICROALBUR No results found for: HGBA1C No results found for: TSH No results found for: VITAMINB12 No results found for: FOLATE No results found for: IRON, TIBC, FERRITIN  Imaging and Procedures obtained prior to SNF admission: DG Chest Port 1 View  Result Date: 10/01/2019 CLINICAL DATA:  Back pain, nausea and vomiting, weakness, hypoxia EXAM: PORTABLE CHEST 1 VIEW COMPARISON:  07/23/2019 FINDINGS: Single frontal view of the chest demonstrates a stable cardiac silhouette. No airspace disease, effusion, or pneumothorax. No acute bony abnormalities. IMPRESSION: 1. Stable exam, no acute process. Electronically Signed   By: Randa Ngo M.D.   On: 10/01/2019 22:24     Not all labs, radiology exams or other studies done during hospitalization come through on my EPIC note; however they are reviewed by me.    Assessment and Plan  Sepsis secondary to UTI-WBC 21.6; patient was started on IV antibiotics and transition to [place]. SNF-admitted for OT/PT  C. difficile positive first recurrent C. difficile-patient was treated with vancomycin 500 mg every 6 hours SNF-continue vancomycin 250 mg every 6 hours for 6 more days  Fluid  overload/left-sided pleural effusion-treated with IV Lasix and with left-sided thoracentesis on 2/19 with removal of 220 mils of fluid suggestive of transudate; 2D echo showed EF of 65%; patient diuresed well O2 was able to be weaned  off SNF-monitor for recurrence  Hypomagnesemia/hypokalemia-repleted SNF-repeat BMP  Hypertension-not well controlled and Norvasc was started SNF-continue Norvasc 5 mg daily along with metoprolol XL 25 mg daily  IBS SNF-not stated as uncontrolled; continue Bentyl 20 mg 4 times daily  Depression SNF-appears controlled; continue Prozac 20 mg daily  Multiple sclerosis SNF-stable and chronic; continue 600 mg IV of ocrelizumab every 6 months  Hyperlipidemia SNF-not stated as uncontrolled; continue Pravachol 40 mg daily    Time spent greater than 45 minutes;> 50% of time with patient was spent reviewing records, labs, tests and studies, counseling and developing plan of care  Hennie Duos, MD

## 2019-10-12 ENCOUNTER — Encounter: Payer: Self-pay | Admitting: Internal Medicine

## 2019-10-12 DIAGNOSIS — N390 Urinary tract infection, site not specified: Secondary | ICD-10-CM | POA: Insufficient documentation

## 2019-10-12 DIAGNOSIS — A419 Sepsis, unspecified organism: Secondary | ICD-10-CM | POA: Insufficient documentation

## 2019-10-12 DIAGNOSIS — F329 Major depressive disorder, single episode, unspecified: Secondary | ICD-10-CM | POA: Insufficient documentation

## 2019-10-12 DIAGNOSIS — A0472 Enterocolitis due to Clostridium difficile, not specified as recurrent: Secondary | ICD-10-CM | POA: Insufficient documentation

## 2019-10-12 DIAGNOSIS — J90 Pleural effusion, not elsewhere classified: Secondary | ICD-10-CM | POA: Insufficient documentation

## 2019-10-12 DIAGNOSIS — F32A Depression, unspecified: Secondary | ICD-10-CM | POA: Insufficient documentation

## 2019-10-12 DIAGNOSIS — E8770 Fluid overload, unspecified: Secondary | ICD-10-CM | POA: Insufficient documentation

## 2019-10-17 LAB — CBC AND DIFFERENTIAL
HCT: 36 (ref 36–46)
Hemoglobin: 11.4 — AB (ref 12.0–16.0)
Neutrophils Absolute: 6
Platelets: 292 (ref 150–399)
WBC: 8.8

## 2019-10-17 LAB — COMPREHENSIVE METABOLIC PANEL
Calcium: 9 (ref 8.7–10.7)
GFR calc Af Amer: 90
GFR calc non Af Amer: 87.7

## 2019-10-17 LAB — BASIC METABOLIC PANEL
BUN: 16 (ref 4–21)
CO2: 25 — AB (ref 13–22)
Chloride: 106 (ref 99–108)
Creatinine: 0.7 (ref 0.5–1.1)
Glucose: 78
Potassium: 4.4 (ref 3.4–5.3)
Sodium: 142 (ref 137–147)

## 2019-10-17 LAB — CBC: RBC: 4.33 (ref 3.87–5.11)

## 2019-10-29 ENCOUNTER — Telehealth: Payer: Self-pay | Admitting: Nurse Practitioner

## 2019-10-29 NOTE — Telephone Encounter (Signed)
Called and reported diarrhea x 6 this morning, afebrile T 97.6 BP 120/69. Hx of C-diff colitis, negative last C-diff 10/19/19. No c/o nausea, vomiting, abd pain. Imodium prn. Obtain C-diff toxin A/B, CBC/diff, CMP/eGFR.

## 2019-10-31 ENCOUNTER — Non-Acute Institutional Stay (SKILLED_NURSING_FACILITY): Payer: Medicare Other | Admitting: Internal Medicine

## 2019-10-31 DIAGNOSIS — A0472 Enterocolitis due to Clostridium difficile, not specified as recurrent: Secondary | ICD-10-CM

## 2019-10-31 DIAGNOSIS — R197 Diarrhea, unspecified: Secondary | ICD-10-CM

## 2019-10-31 DIAGNOSIS — D72829 Elevated white blood cell count, unspecified: Secondary | ICD-10-CM | POA: Diagnosis not present

## 2019-10-31 LAB — CBC AND DIFFERENTIAL
HCT: 38 (ref 36–46)
Hemoglobin: 12.6 (ref 12.0–16.0)
Neutrophils Absolute: 20
Platelets: 284 (ref 150–399)
WBC: 24.9

## 2019-10-31 LAB — BASIC METABOLIC PANEL
BUN: 14 (ref 4–21)
CO2: 28 — AB (ref 13–22)
Chloride: 99 (ref 99–108)
Creatinine: 0.7 (ref 0.5–1.1)
Glucose: 122
Potassium: 3.3 — AB (ref 3.4–5.3)
Sodium: 141 (ref 137–147)

## 2019-10-31 LAB — COMPREHENSIVE METABOLIC PANEL
Albumin: 3.4 — AB (ref 3.5–5.0)
Calcium: 9.1 (ref 8.7–10.7)
GFR calc Af Amer: 90
GFR calc non Af Amer: 90
Globulin: 2.8

## 2019-10-31 LAB — HEPATIC FUNCTION PANEL
ALT: 6 — AB (ref 7–35)
AST: 7 — AB (ref 13–35)
Alkaline Phosphatase: 52 (ref 25–125)
Bilirubin, Total: 0.2

## 2019-10-31 LAB — CBC: RBC: 4.74 (ref 3.87–5.11)

## 2019-10-31 NOTE — Progress Notes (Signed)
This is an acute visit.  Level care is skilled.  [facility]  Chief complaint acute visit secondary to elevated white count-follow-up diarrhea.  History of present illness.  Patient is a pleasant 64 year old female seen today for follow-up of diarrhea and with a lab that shows a white count of 24,900.  Patient is here for rehab after hospitalization at [facility].  She had a somewhat complicated hospital course presenting with chills rigors abdominal pain and cough and shortness of breath.  She was started on IV antibiotics and p.o. vancomycin-her C. difficile was positive.  Infectious disease was involved IV antibiotics were discontinued and she was transitioned to [employer].  CT of her abdomen and pelvis did not show any significant colonic wall thickening.  Her white count normalized.  Macrobid was discontinued and she was continued on p.o. vancomycin 500 mg every 6 hours and was followed by infectious disease.  She also had fluid overload 2D echo showed ejection fraction of 65% she did receive diuresis and this apparently worked well.  Potassium and magnesium also were low and this was supplemented.  She also had elevated blood pressure and was started on Norvasc.  She was admitted here for PT and OT.  Apparently over the weekend she developed diarrhea-C. difficile culture is pending-blood work was also ordered which shows a white count of 24,900 her potassium also was slightly low at 3.3.  Currently she is lying in bed comfortably she does complain of fairly persistent diarrhea and some mild abdominal discomfort.  She has been afebrile it appears her temperature was 99.2 recently but currently is 98.2.       Past Medical History:  Diagnosis Date  . Actinic keratosis   . BCC (basal cell carcinoma of skin)   . Closed fracture of second metatarsal bone of right foot   . Closed fracture of third metatarsal bone of right foot with routine  healing   . IBS (irritable bowel syndrome)   . Multiple sclerosis ([place])   . Neurogenic bladder   . Rectal polyp    Tubular adenoma  . Recurrent UTI   . SCC (squamous cell carcinoma)          Past Surgical History:  Procedure Laterality Date  . BOTOX INJECTION    . BREAST BIOPSY    . COLONOSCOPY    . SKIN CANCER EXCISION      Allergies as of 10/11/2019   No Known Allergies        Medication List             acetaminophen 325 MG tablet Commonly known as: TYLENOL Take 325 mg by mouth every 4 (four) hours as needed.   amLODipine 5 MG tablet Commonly known as: NORVASC Take 5 mg by mouth daily.   baclofen 10 MG tablet Commonly known as: LIORESAL Take 30 mg by mouth 3 (three) times daily. Take 3 tablets to = 30 mg   baclofen 20 MG tablet Commonly known as: LIORESAL Take 20 mg by mouth 3 (three) times daily.   cholecalciferol 25 MCG (1000 UNIT) tablet Commonly known as: VITAMIN D Take 1,000 Units by mouth daily.   Culturelle Caps Take 1 capsule by mouth daily. 10B cell   dicyclomine 20 MG tablet Commonly known as: BENTYL Take 20 mg by mouth in the morning, at noon, in the evening, and at bedtime.       FLUoxetine 20 MG capsule Commonly known as: PROZAC Take 20 mg by  mouth daily.   metoprolol succinate 25 MG 24 hr tablet Commonly known as: TOPROL-XL Take 25 mg by mouth daily.   nystatin ointment Commonly known as: MYCOSTATIN Apply 1 application topically 3 (three) times daily.   Ocrevus 300 MG/10ML injection Generic drug: ocrelizumab Inject 600 mg into the vein every 6 (six) months.   pravastatin 40 MG tablet Commonly known as: PRAVACHOL Take 40 mg by mouth daily.   tiZANidine 2 MG tablet Commonly known as: ZANAFLEX Take 2 mg by mouth at bedtime.   vitamin B-12 1000 MCG tablet Commonly known as: CYANOCOBALAMIN Take 1,000 mcg by mouth daily.       No orders of the defined types were placed in  this encounter.       Immunization History  Administered Date(s) Administered  . Influenza, High Dose Seasonal PF 07/01/2018  . Influenza,inj,Quad PF,6+ Mos 05/20/2013, 05/26/2014, 06/18/2015, 06/18/2016, 06/29/2017, 06/20/2019  . Influenza-Unspecified 06/20/2019  . PPD Test 08/23/2019  . Td 08/18/1998  . Tdap 03/11/2012    Social History       Tobacco Use  . Smoking status: Never Smoker  Substance Use Topics  . Alcohol use: Yes    Family history is        Family History  Problem Relation Age of Onset  . Hypertension Mother   . Hyperlipidemia Mother   . Breast cancer Mother   . Hypertension Father   . Heart attack Father   . Hypertension Brother   . Diabetes Brother   . Lung cancer Maternal Grandmother   . Heart attack Paternal Grandfather     Review of systems.  General she is not complaining of fever or chills.  Skin is not complain of rashes itching or diaphoresis.  Head ears eyes nose mouth and throat does not complain of a sore throat or difficulty swallowing-she said after sleeping pretty hard last night that she had some blurry vision initially this morning but that cleared up.  Respiratory does not complain of shortness of breath or cough.  Cardiac is not complaining of chest pain or increasing edema.  GI does complain of some abdominal discomfort although she does not really describe this as acute-her main complaint is diarrhea which is fairly persistent.  GU is not complaining of dysuria.  Musculoskeletal does have a history of multiple sclerosis is not complaining of joint pain currently.  Neurologic as noted above she is not complaining of dizziness headache numbness at this time.  And psych does not complain of being depressed or anxious   Physical exam.  Temperature is 98.2 pulse 89 respirations 18 blood pressure 127/79.  In general this is a pleasant female in no distress lying comfortably in bed.  Her skin is warm  and dry.  Eyes visual acuity appears to be intact sclera and conjunctive are clear.  Oropharynx is clear mucous membranes moist.  Chest is clear to auscultation there is no labored breathing.  Heart is regular rate and rhythm with an occasional irregular beat-she has mild lower extremity edema.  Abdomen is soft has some tenderness to palpation but I would not classify this as acute more of a soreness-bowel sounds are active.  Musculoskeletal Limited exam since she is in bed but is able to move all extremities x4.  Neurologic as noted above her speech is clear cranial nerves appear to be intact.  Psych she is alert and oriented pleasant and appropriate.  Labs.  October 31, 2019.  WBC 24.9 hemoglobin 12.6 platelets 284.  Sodium  141 potassium 3.3 BUN 13.9 creatinine 0.65.  Albumin 3.4 otherwise liver function tests within normal limits.  Assessment and plan.  1.-History of elevated white count with diarrhea-1 would be suspicious of C. difficile-C. difficile culture is pending.  Will empirically start her on vancomycin 125 mg every 6 hours for 14 days and monitor.  Also will supplement her somewhat low potassium with 20 mEq of potassium for 3 days.  Will update a CBC and basic metabolic panel later this week to keep an eye on her electrolytes and white count.  We will await C. difficile results if negative will pursue other studies but at this point there is  suspicion this could be recurrent C. difficile.  This plan was discussed with Dr. Sheppard Coil.  TA:[id number]

## 2019-11-01 ENCOUNTER — Encounter: Payer: Self-pay | Admitting: Internal Medicine

## 2019-11-01 ENCOUNTER — Non-Acute Institutional Stay (SKILLED_NURSING_FACILITY): Payer: Medicare Other | Admitting: Internal Medicine

## 2019-11-01 DIAGNOSIS — A0471 Enterocolitis due to Clostridium difficile, recurrent: Secondary | ICD-10-CM

## 2019-11-02 NOTE — Progress Notes (Signed)
Location:   [facility]:   SNF Provider: Hennie Duos MD  Patient, No Pcp Per  Patient Care Team: Patient, No Pcp Per as PCP - General (General Practice)  Extended Emergency Contact Information Primary Emergency Contact: [name] Mobile Phone: [phone] Relation: Other Secondary Emergency Contact: [name] Mobile Phone: [phone] Relation: Other    Allergies: Patient has no known allergies.  Chief Complaint  Patient presents with  . Acute Visit    HPI: Patient is 63 y.o. female who is being seen in follow-up for an elevated white count of 24.5 and diarrhea.  Patient was started on p.o. vancomycin pending return of C. difficile.  C. difficile is positive.  Today patient after several days of vancomycin says that her diarrhea is better.  Past Medical History:  Diagnosis Date  . Actinic keratosis   . BCC (basal cell carcinoma of skin)   . Closed fracture of second metatarsal bone of right foot   . Closed fracture of third metatarsal bone of right foot with routine healing   . IBS (irritable bowel syndrome)   . Multiple sclerosis ([place])   . Neurogenic bladder   . Rectal polyp    Tubular adenoma  . Recurrent UTI   . SCC (squamous cell carcinoma)     Past Surgical History:  Procedure Laterality Date  . BOTOX INJECTION    . BREAST BIOPSY    . COLONOSCOPY    . SKIN CANCER EXCISION      Allergies as of 11/01/2019   No Known Allergies     Medication List       Accurate as of November 01, 2019 11:59 PM. If you have any questions, ask your nurse or doctor.        acetaminophen 325 MG tablet Commonly known as: TYLENOL Take 325 mg by mouth every 4 (four) hours as needed.   amLODipine 5 MG tablet Commonly known as: NORVASC Take 5 mg by mouth daily.   baclofen 10 MG tablet Commonly known as: LIORESAL Take 30 mg by mouth at bedtime. Take 3 tablets to = 30 mg   baclofen 20 MG tablet Commonly known as: LIORESAL Take 20  mg by mouth 3 (three) times daily.   bisacodyl 10 MG suppository Commonly known as: DULCOLAX If not relieved by MOM, give 10 mg Bisacodyl suppositiory rectally X 1 dose in 24 hours as needed (Do not use constipation standing orders for residents with renal failure/CFR less than 30. Contact MD for orders) (Physician Order)   cholecalciferol 25 MCG (1000 UNIT) tablet Commonly known as: VITAMIN D Take 1,000 Units by mouth daily.   Culturelle Caps Take 1 capsule by mouth daily. 10B cell   dicyclomine 20 MG tablet Commonly known as: BENTYL Take 20 mg by mouth in the morning, at noon, in the evening, and at bedtime.   Ensure Take 237 mLs by mouth 2 (two) times daily between meals. poor appetite (prefers chocolate and over ice).   FLUoxetine 20 MG capsule Commonly known as: PROZAC Take 20 mg by mouth daily.   magnesium hydroxide 400 MG/5ML suspension Commonly known as: MILK OF MAGNESIA If no BM in 3 days, give 30 cc Milk of Magnesium p.o. x 1 dose in 24 hours as needed (Do not use standing constipation orders for residents with renal failure CFR less than 30. Contact MD for orders) (Physician Order)   metoprolol succinate 25 MG 24 hr tablet Commonly known as: TOPROL-XL Take 25 mg  by mouth daily.   nystatin ointment Commonly known as: MYCOSTATIN Apply 1 application topically 3 (three) times daily.   Ocrevus 300 MG/10ML injection Generic drug: ocrelizumab Inject 600 mg into the vein every 6 (six) months.   ondansetron 4 MG tablet Commonly known as: ZOFRAN Take 4 mg by mouth every 6 (six) hours as needed for nausea or vomiting. Notify MD if symptoms persist.   Potassium Chloride ER 20 MEQ Tbcr Take 20 mg by mouth daily.   pravastatin 40 MG tablet Commonly known as: PRAVACHOL Take 40 mg by mouth daily.   RA SALINE ENEMA RE If not relieved by Biscodyl suppository, give disposable Saline Enema rectally X 1 dose/24 hrs as needed (Do not use constipation standing orders for  residents with renal failure/CFR less than 30. Contact MD for orders)(Physician Or   tiZANidine 2 MG tablet Commonly known as: ZANAFLEX Take 2 mg by mouth at bedtime.   vancomycin 125 MG capsule Commonly known as: VANCOCIN Take 125 mg by mouth every 6 (six) hours.   vitamin B-12 1000 MCG tablet Commonly known as: CYANOCOBALAMIN Take 1,000 mcg by mouth daily.       No orders of the defined types were placed in this encounter.   Immunization History  Administered Date(s) Administered  . Influenza, High Dose Seasonal PF 07/01/2018  . Influenza,inj,Quad PF,6+ Mos 05/20/2013, 05/26/2014, 06/18/2015, 06/18/2016, 06/29/2017, 06/20/2019  . Influenza-Unspecified 06/20/2019  . PPD Test 08/23/2019  . Td 08/18/1998  . Tdap 03/11/2012    Social History   Tobacco Use  . Smoking status: Never Smoker  . Smokeless tobacco: Never Used  Substance Use Topics  . Alcohol use: Yes    Alcohol/week: 18.0 standard drinks    Types: 2 Glasses of wine, 16 Standard drinks or equivalent per week    Review of Systems   GENERAL:  no fevers, fatigue, appetite changes SKIN: No itching, rash HEENT: No complaint RESPIRATORY: No cough, wheezing, SOB CARDIAC: No chest pain, palpitations, lower extremity edema  GI: No abdominal pain, No N/V/ or constipation, diarrhea is better no heartburn or reflux  GU: No dysuria, frequency or urgency, or incontinence  MUSCULOSKELETAL: No unrelieved bone/joint pain NEUROLOGIC: No headache, dizziness  PSYCHIATRIC: No overt anxiety or sadness  There were no vitals filed for this visit. There is no height or weight on file to calculate BMI. Physical Exam  GENERAL APPEARANCE: Alert, conversant, No acute distress  SKIN: No diaphoresis rash HEENT: Unremarkable RESPIRATORY: Breathing is even, unlabored. Lung sounds are clear   CARDIOVASCULAR: Heart RRR no murmurs, rubs or gallops. No peripheral edema  GASTROINTESTINAL: Abdomen is soft, non-tender, not distended w/  normal bowel sounds.  GENITOURINARY: Bladder non tender, not distended  MUSCULOSKELETAL: No abnormal joints or musculature NEUROLOGIC: Cranial nerves 2-12 grossly intact. Moves all extremities PSYCHIATRIC: Mood and affect appropriate to situation, no behavioral issues  Patient Active Problem List   Diagnosis Date Noted  . Sepsis secondary to UTI ([place]) 10/12/2019  . Clostridium difficile diarrhea 10/12/2019  . Fluid overload 10/12/2019  . Pleural effusion on left 10/12/2019  . Depression 10/12/2019  . Hypertension 07/29/2019  . Impaired mobility and activities of daily living 07/29/2019  . Neurogenic bladder 06/03/2018  . Hyperlipidemia 05/23/2013  . Irritable bowel syndrome 05/21/2011  . Multiple sclerosis ([place]) 05/21/2011    CMP     Component Value Date/Time   NA 140 10/10/2019 0000   K 5.0 10/10/2019 0000   CL 102 10/10/2019 0000   CO2 30 (A) 10/10/2019 0000  GLUCOSE 116 (H) 10/01/2019 2151   BUN 18 10/10/2019 0000   CREATININE 0.8 10/10/2019 0000   CREATININE 0.88 10/01/2019 2151   CALCIUM 9.4 10/10/2019 0000   PROT 7.1 10/01/2019 2151   ALBUMIN 3.6 10/02/2019 0000   AST 14 10/02/2019 0000   ALT 10 10/02/2019 0000   ALKPHOS 46 10/02/2019 0000   BILITOT 0.7 10/01/2019 2151   GFRNONAA >60 10/01/2019 2151   GFRAA >60 10/01/2019 2151   Recent Labs    10/01/19 2151 10/10/19 0000  NA 143 140  K 3.4* 5.0  CL 106 102  CO2 25 30*  GLUCOSE 116*  --   BUN 10 18  CREATININE 0.88 0.8  CALCIUM 9.4 9.4   Recent Labs    10/01/19 2151 10/02/19 0000  AST 18 14  ALT 15 10  ALKPHOS 52 46  BILITOT 0.7  --   PROT 7.1  --   ALBUMIN 3.7 3.6   Recent Labs    10/01/19 2151 10/10/19 0000  WBC 21.6* 9.8  NEUTROABS 18.9*  --   HGB 11.7* 13.1  HCT 37.8 40  MCV 86.3  --   PLT 335 391   No results for input(s): CHOL, LDLCALC, TRIG in the last 8760 hours.  Invalid input(s): HCL No results found for: MICROALBUR No results found for: TSH No results found for:  HGBA1C No results found for: CHOL, HDL, LDLCALC, LDLDIRECT, TRIG, CHOLHDL  Significant Diagnostic Results in last 30 days:  No results found.  Assessment and Plan  C. difficile-second recurrence-requires prolonged treatment with a taper lasting 6 to 8 weeks.  Initial vancomycin have been written and I am ordering the full course of vancomycin p.o..  I made the patient aware of her positive test and the prolonged treatment course.    Hennie Duos, MD

## 2019-11-03 LAB — CBC AND DIFFERENTIAL
HCT: 38 (ref 36–46)
Hemoglobin: 12.1 (ref 12.0–16.0)
Neutrophils Absolute: 20
Platelets: 335 (ref 150–399)
WBC: 23.4

## 2019-11-03 LAB — BASIC METABOLIC PANEL
BUN: 10 (ref 4–21)
CO2: 28 — AB (ref 13–22)
Chloride: 101 (ref 99–108)
Creatinine: 0.7 (ref 0.5–1.1)
Glucose: 84
Potassium: 4 (ref 3.4–5.3)
Sodium: 142 (ref 137–147)

## 2019-11-03 LAB — COMPREHENSIVE METABOLIC PANEL
Calcium: 9 (ref 8.7–10.7)
GFR calc Af Amer: 90
GFR calc non Af Amer: 90

## 2019-11-03 LAB — CBC: RBC: 4.68 (ref 3.87–5.11)

## 2019-11-10 ENCOUNTER — Non-Acute Institutional Stay (SKILLED_NURSING_FACILITY): Payer: Medicare Other | Admitting: Internal Medicine

## 2019-11-10 DIAGNOSIS — D72829 Elevated white blood cell count, unspecified: Secondary | ICD-10-CM | POA: Diagnosis not present

## 2019-11-10 DIAGNOSIS — A0472 Enterocolitis due to Clostridium difficile, not specified as recurrent: Secondary | ICD-10-CM | POA: Diagnosis not present

## 2019-11-10 DIAGNOSIS — I10 Essential (primary) hypertension: Secondary | ICD-10-CM | POA: Diagnosis not present

## 2019-11-10 DIAGNOSIS — K580 Irritable bowel syndrome with diarrhea: Secondary | ICD-10-CM

## 2019-11-10 LAB — CBC AND DIFFERENTIAL
HCT: 33 — AB (ref 36–46)
Hemoglobin: 10.7 — AB (ref 12.0–16.0)
Neutrophils Absolute: 12
Platelets: 430 — AB (ref 150–399)
WBC: 14.6

## 2019-11-10 LAB — BASIC METABOLIC PANEL
BUN: 14 (ref 4–21)
CO2: 27 — AB (ref 13–22)
Chloride: 101 (ref 99–108)
Creatinine: 0.6 (ref 0.5–1.1)
Glucose: 90
Potassium: 4.3 (ref 3.4–5.3)
Sodium: 140 (ref 137–147)

## 2019-11-10 LAB — CBC: RBC: 4.02 (ref 3.87–5.11)

## 2019-11-10 LAB — COMPREHENSIVE METABOLIC PANEL
Calcium: 8.9 (ref 8.7–10.7)
GFR calc Af Amer: 90
GFR calc non Af Amer: 90

## 2019-11-11 ENCOUNTER — Encounter: Payer: Self-pay | Admitting: Internal Medicine

## 2019-11-11 NOTE — Progress Notes (Signed)
This is an acute visit.  Level of care skilled.  [facility]  Chief complaint-acute visit follow-up diarrhea-C. difficile-leukocytosis.  Marland Kitchen  History of present illness.  Patient is a pleasant 64 year old female seen today for follow-up of leukocytosis thought secondary to C. difficile infection.  She is here for rehab after hospitalization at [facility] course was complicated with chills rigors abdominal pain cough and shortness of breath she did receive IV antibiotics and p.o. vancomycin.  She did have C. difficile.  I CT of her abdomen pelvis did not show any significant colonic wall thickening And eventually her white count normalized.  She did complete a course of p.o. vancomycin.  On readmission here she did redevelop diarrhea and C. difficile culture was positive she also had a white count of over 24,000.  Her potassium was slightly low at 3.3 this was supplemented and normalized.  Her white count appears to be trending down it is now down to 14,600.  Potassium shows stability at 4.3.  She has been started on a prolonged course of vancomycin currently 125 mg every 6 hours-this will be titrated down in frequency over the course of the next few weeks  She says the diarrhea is largely resolved she is lying in bed comfortably does not really have any acute complaints vital signs appear to be stable she is afebrile.  Past Medical History:  Diagnosis Date  . Actinic keratosis   . BCC (basal cell carcinoma of skin)   . Closed fracture of second metatarsal bone of right foot   . Closed fracture of third metatarsal bone of right foot with routine healing   . IBS (irritable bowel syndrome)   . Multiple sclerosis ([place])   . Neurogenic bladder   . Rectal polyp    Tubular adenoma  . Recurrent UTI   . SCC (squamous cell carcinoma)          Past Surgical History:  Procedure Laterality Date  . BOTOX INJECTION    .  BREAST BIOPSY    . COLONOSCOPY    . SKIN CANCER EXCISION        NKDA            Medication List                  acetaminophen325 MG tablet Commonly known as: TYLENOL Take 325 mg by mouth every 4 (four) hours as needed.    amLODipine5 MG tablet Commonly known as: NORVASC Take 5 mg by mouth daily.    baclofen10 MG tablet Commonly known as: LIORESAL Take 30 mg by mouth 3 (three) times daily. Take 3 tablets to = 30 mg    baclofen20 MG tablet Commonly known as: LIORESAL Take 20 mg by mouth 3 (three) times daily.    cholecalciferol25 MCG (1000 UNIT) tablet Commonly known as: VITAMIN D Take 1,000 Units by mouth daily.    CulturelleCaps Take 1 capsule by mouth daily. 10B cell    dicyclomine20 MG tablet Commonly known as: BENTYL Take 20 mg by mouth in the morning, at noon, in the evening, and at bedtime.         FLUoxetine20 MG capsule Commonly known as: PROZAC Take 20 mg by mouth daily.    metoprolol succinate25 MG 24 hr tablet Commonly known as: TOPROL-XL Take 25 mg by mouth daily.    nystatin ointment Commonly known as: MYCOSTATIN Apply 1 application topically 3 (three) times daily.    Ocrevus300 MG/10ML injection  Generic drug: ocrelizumab Inject 600 mg into the vein every 6 (six) months.    pravastatin40 MG tablet Commonly known as: PRAVACHOL Take 40 mg by mouth daily.    tiZANidine2 MG tablet Commonly known as: ZANAFLEX Take 2 mg by mouth at bedtime.    vitamin B-121000 MCG tablet Commonly known as: CYANOCOBALAMIN Take 1,000 mcg by mouth daily.         No orders of the defined types were placed in this encounter.       Immunization History  Administered Date(s) Administered  . Influenza, High Dose Seasonal PF 07/01/2018  . Influenza,inj,Quad PF,6+ Mos 05/20/2013, 05/26/2014, 06/18/2015, 06/18/2016, 06/29/2017, 06/20/2019  . Influenza-Unspecified 06/20/2019  . PPD Test  08/23/2019  . Td 08/18/1998  . Tdap 03/11/2012    Social History       Tobacco Use  . Smoking status: Never Smoker  Substance Use Topics  . Alcohol use: Yes    Family history is       Family History  Problem Relation Age of Onset  . Hypertension Mother   . Hyperlipidemia Mother   . Breast cancer Mother   . Hypertension Father   . Heart attack Father   . Hypertension Brother   . Diabetes Brother   . Lung cancer Maternal Grandmother   . Heart attack Paternal Grandfather    Review of systems.  In general she is not complaining of any fever or chills.  Skin no complaints of rashes or itching.  Head ears eyes nose mouth and throat does not complain of visual changes or sore throat.  Respiratory is not complaining of shortness of breath or cough.  Cardiac no complaints of chest pain or increased edema from baseline.   GI is not really complaining of abdominal discomfort at this time since the diarrhea has resolved does not complain of nausea or vomiting  Musculoskeletal does have a history of multiple sclerosis does not complain of joint pain at this time.  Neurologic again positive for MS does not complain of headache dizziness syncope.  Psych does not complain of being depressed or anxious appears to be in good spirits  Physical exam.  Temperature is 97.6 pulse 70 respirations 18 blood pressure 106/64.  In general this is a pleasant female in no distress lying comfortably in bed.  Her skin is warm and dry.  Eyes visual acuity appears to be intact sclera and conjunctive are clear.  Oropharynx is clear mucous membranes moist.  Chest is clear to auscultation there is no labored breathing.  Heart is regular rate and rhythm without murmur gallop or rub she has baseline mild/moderate lower extremity edema bilaterally.  Abdomen is protuberant soft nontender with positive bowel sounds.  Musculoskeletal appears able to move her  extremities x4 with lower extremity weakness limited exam since she is in bed.  Neurologic findings as noted above her speech is clear cranial nerves intact.  Psych she is pleasant and appropriate alert and oriented.  Labs.  November 10, 2019.  WBCs 14,600 hemoglobin 10.7 platelets 430.  October 31, 2019.  WBC 24.9 hemoglobin 12.6 platelets 284.  Sodium 141 potassium 3.3 BUN 13.9 creatinine 0.65.  Albumin 3.4 otherwise liver function tests within normal limits.  Sodium 140 potassium 4.3 BUN 13.8 creatinine 0.55.  Assessment and plan.  1.  History of C. difficile with elevated white count-this appears to be trending down-white count is down to 14,600-she is afebrile says the diarrhea has largely resolved.  She continues on a prolonged course  of vancomycin as noted above.  She appears to be stable in this regards.  Of note we also obtained urinalysis secondary to elevated white count initially but this has not really grown out anything of significance and I did relay this to patient as well.  Will update a CBC and metabolic panel next week secondary to some history of hypokalemia in the past but this appears to have stabilized I suspect the hypokalemia was secondary to the fairly profuse diarrhea.  2.  History of hypertension this appears to be stable on Norvasc 5 mg a day as well as Toprol-XL 25 mg a day-recent blood pressures 106/64-137/64-115/71.  3.  Irritable bowel syndrome this appears stable she is on Bentyl 20 mg 4 times a day again the diarrhea has resolved she is not complaining of constipation.  4.  History of fluid overload with left-sided pleural effusion she was treated with IV Lasix in the hospital with a thoracentesis as well.  Her weight appears to be stable edema appears to be baseline she does not complain of any shortness of breath.  [id number]

## 2019-11-13 ENCOUNTER — Encounter: Payer: Self-pay | Admitting: Internal Medicine

## 2019-11-13 DIAGNOSIS — A0471 Enterocolitis due to Clostridium difficile, recurrent: Secondary | ICD-10-CM | POA: Insufficient documentation

## 2019-11-15 MED ORDER — BACLOFEN 10 MG PO TABS
20.00 | ORAL_TABLET | ORAL | Status: DC
Start: 2019-11-15 — End: 2019-11-15

## 2019-11-15 MED ORDER — METOPROLOL SUCCINATE ER 25 MG PO TB24
25.00 | ORAL_TABLET | ORAL | Status: DC
Start: 2019-11-18 — End: 2019-11-15

## 2019-11-15 MED ORDER — ENOXAPARIN SODIUM 40 MG/0.4ML ~~LOC~~ SOLN
40.00 | SUBCUTANEOUS | Status: DC
Start: 2019-11-17 — End: 2019-11-15

## 2019-11-15 MED ORDER — TIZANIDINE HCL 4 MG PO TABS
2.00 | ORAL_TABLET | ORAL | Status: DC
Start: 2019-11-17 — End: 2019-11-15

## 2019-11-15 MED ORDER — ONDANSETRON HCL 4 MG/2ML IJ SOLN
4.00 | INTRAMUSCULAR | Status: DC
Start: ? — End: 2019-11-15

## 2019-11-15 MED ORDER — CYANOCOBALAMIN 100 MCG PO TABS
100.00 | ORAL_TABLET | ORAL | Status: DC
Start: 2019-11-18 — End: 2019-11-15

## 2019-11-15 MED ORDER — BACLOFEN 10 MG PO TABS
30.00 | ORAL_TABLET | ORAL | Status: DC
Start: 2019-11-15 — End: 2019-11-15

## 2019-11-15 MED ORDER — RA PROBIOTIC DIGESTIVE CARE PO CAPS
1.00 | ORAL_CAPSULE | ORAL | Status: DC
Start: 2019-11-18 — End: 2019-11-15

## 2019-11-15 MED ORDER — GENERIC EXTERNAL MEDICATION
1.00 | Status: DC
Start: 2019-11-17 — End: 2019-11-15

## 2019-11-15 MED ORDER — LORAZEPAM 0.5 MG PO TABS
0.50 | ORAL_TABLET | ORAL | Status: DC
Start: ? — End: 2019-11-15

## 2019-11-15 MED ORDER — ACETAMINOPHEN 500 MG PO TABS
500.00 | ORAL_TABLET | ORAL | Status: DC
Start: ? — End: 2019-11-15

## 2019-11-15 MED ORDER — ATORVASTATIN CALCIUM 10 MG PO TABS
20.00 | ORAL_TABLET | ORAL | Status: DC
Start: 2019-11-17 — End: 2019-11-15

## 2019-11-15 MED ORDER — FLUOXETINE HCL 20 MG PO CAPS
20.00 | ORAL_CAPSULE | ORAL | Status: DC
Start: 2019-11-18 — End: 2019-11-15

## 2019-11-15 MED ORDER — AMLODIPINE BESYLATE 5 MG PO TABS
5.00 | ORAL_TABLET | ORAL | Status: DC
Start: 2019-11-18 — End: 2019-11-15

## 2019-11-17 ENCOUNTER — Non-Acute Institutional Stay (SKILLED_NURSING_FACILITY): Payer: Medicare Other | Admitting: Internal Medicine

## 2019-11-17 ENCOUNTER — Encounter: Payer: Self-pay | Admitting: Internal Medicine

## 2019-11-17 DIAGNOSIS — G35 Multiple sclerosis: Secondary | ICD-10-CM | POA: Diagnosis not present

## 2019-11-17 DIAGNOSIS — N390 Urinary tract infection, site not specified: Secondary | ICD-10-CM | POA: Diagnosis not present

## 2019-11-17 DIAGNOSIS — A0471 Enterocolitis due to Clostridium difficile, recurrent: Secondary | ICD-10-CM | POA: Diagnosis not present

## 2019-11-17 DIAGNOSIS — J90 Pleural effusion, not elsewhere classified: Secondary | ICD-10-CM

## 2019-11-17 DIAGNOSIS — K580 Irritable bowel syndrome with diarrhea: Secondary | ICD-10-CM | POA: Diagnosis not present

## 2019-11-17 DIAGNOSIS — I10 Essential (primary) hypertension: Secondary | ICD-10-CM

## 2019-11-17 MED ORDER — DICYCLOMINE HCL 10 MG PO CAPS
20.00 | ORAL_CAPSULE | ORAL | Status: DC
Start: 2019-11-17 — End: 2019-11-17

## 2019-11-17 MED ORDER — BACLOFEN 10 MG PO TABS
20.00 | ORAL_TABLET | ORAL | Status: DC
Start: 2019-11-17 — End: 2019-11-17

## 2019-11-17 MED ORDER — TAMSULOSIN HCL 0.4 MG PO CAPS
0.40 | ORAL_CAPSULE | ORAL | Status: DC
Start: 2019-11-18 — End: 2019-11-17

## 2019-11-17 NOTE — Progress Notes (Signed)
Location:    [facility] Room Number: 111/P Place of Service:  SNF (31) Provider:  Lorne Skeens  Patient, No Pcp Per  Patient Care Team: Patient, No Pcp Per as PCP - General (General Practice)  Extended Emergency Contact Information Primary Emergency Contact: [name] Mobile Phone: [phone] Relation: Other Secondary Emergency Contact: [name] Mobile Phone: [phone] Relation: Other  Code Status:  Full Code Goals of care: Advanced Directive information Advanced Directives 11/17/2019  Does Patient Have a Medical Advance Directive? Yes  Type of Advance Directive (No Data)  Does patient want to make changes to medical advance directive? No - Patient declined     Chief complaint-acute visit status post hospitalization for suspected UTI.    HPI:  Pt is a 64 y.o. female seen today for a hospital f/u after admission for a complicated UTI.  Patient has a history of UTIs as well as multiple sclerosis in remission-hypertension B12 deficiency hyperlipidemia anxiety depression and recent history of C. difficile.  She presented to the hospital March 28 after spiking an elevated temperature in the facility despite being on vancomycin for C. difficile.  Cultures were obtained and she was started on meropenem and sepsis resolved with IV fluid and medications.  She has been discharged on Levaquin for 10-day course.  In regards to C. difficile apparently her diarrhea has resolved per her report she is no longer on vancomycin.  Currently she is resting in bed comfortably she does not really have any complaints she appears to be feeling well.  Vital signs appear to be stable.    Past Medical History:  Diagnosis Date  . Actinic keratosis   . BCC (basal cell carcinoma of skin)   . Closed fracture of second metatarsal bone of right foot   . Closed fracture of third metatarsal bone of right foot with routine healing   . IBS  (irritable bowel syndrome)   . Multiple sclerosis ([place])   . Neurogenic bladder   . Rectal polyp    Tubular adenoma  . Recurrent UTI   . SCC (squamous cell carcinoma)    Past Surgical History:  Procedure Laterality Date  . BOTOX INJECTION    . BREAST BIOPSY    . COLONOSCOPY    . SKIN CANCER EXCISION      No Known Allergies  Allergies as of 11/17/2019   No Known Allergies     Medication List       Accurate as of November 17, 2019  3:04 PM. If you have any questions, ask your nurse or doctor.        STOP taking these medications   bisacodyl 10 MG suppository Commonly known as: DULCOLAX Stopped by: Granville Lewis, PA-C   magnesium hydroxide 400 MG/5ML suspension Commonly known as: MILK OF MAGNESIA Stopped by: Granville Lewis, PA-C   nystatin ointment Commonly known as: MYCOSTATIN Stopped by: Granville Lewis, PA-C   Potassium Chloride ER 20 MEQ Tbcr Stopped by: Granville Lewis, PA-C   RA SALINE ENEMA RE Stopped by: Granville Lewis, PA-C     TAKE these medications   acetaminophen 325 MG tablet Commonly known as: TYLENOL Take 325 mg by mouth every 4 (four) hours as needed.   amLODipine 5 MG tablet Commonly known as: NORVASC Take 5 mg by mouth daily.   baclofen 10 MG tablet Commonly known as: LIORESAL Take 30 mg by mouth at bedtime. Take 3 tablets to = 30 mg   baclofen 20 MG tablet Commonly  known as: LIORESAL Take 20 mg by mouth 3 (three) times daily.   cholecalciferol 25 MCG (1000 UNIT) tablet Commonly known as: VITAMIN D Take 1,000 Units by mouth daily.   Culturelle Caps Take 1 capsule by mouth daily. 10B cell   dicyclomine 20 MG tablet Commonly known as: BENTYL Take 20 mg by mouth in the morning, at noon, in the evening, and at bedtime.   Ensure Take 237 mLs by mouth 2 (two) times daily between meals. poor appetite (prefers chocolate and over ice).   FLUoxetine 20 MG capsule Commonly known as: PROZAC Take 20 mg by mouth daily.   LORazepam 0.5 MG tablet Commonly  known as: ATIVAN Take 0.5 mg by mouth every 8 (eight) hours.   metoprolol succinate 25 MG 24 hr tablet Commonly known as: TOPROL-XL Take 25 mg by mouth daily.   Ocrevus 300 MG/10ML injection Generic drug: ocrelizumab Inject 600 mg into the vein every 6 (six) months.   ondansetron 4 MG tablet Commonly known as: ZOFRAN Take 4 mg by mouth every 6 (six) hours as needed for nausea or vomiting. Notify MD if symptoms persist.   pravastatin 40 MG tablet Commonly known as: PRAVACHOL Take 40 mg by mouth daily.   tamsulosin 0.4 MG Caps capsule Commonly known as: FLOMAX Take 0.4 mg by mouth daily.   tiZANidine 2 MG tablet Commonly known as: ZANAFLEX Take 2 mg by mouth at bedtime.   vitamin B-12 1000 MCG tablet Commonly known as: CYANOCOBALAMIN Take 1,000 mcg by mouth daily.       Review of Systems   General she is not complaining of any fever or chills.  Skin does not complain of rashes itching or diaphoresis.  Head ears eyes nose mouth and throat does not complain of visual changes or sore throat.  Respiratory is not complain of being short of breath or having cough.  Cardiac does not complain of chest pain or increasing edema.  GI is not complaining of abdominal pain nausea vomiting diarrhea constipation.  GU does not complain of dysuria is completing treatment for UTI.  Musculoskeletal does not complain of joint pain currently.  Neurologic does not complain of dizziness headache numbness at this point she does have a history of multiple sclerosis does have lower extremity weakness.  Psych does not complain of being depressed or anxious appears to be in good spirits.    Immunization History  Administered Date(s) Administered  . Influenza, High Dose Seasonal PF 07/01/2018  . Influenza,inj,Quad PF,6+ Mos 05/20/2013, 05/26/2014, 06/18/2015, 06/18/2016, 06/29/2017, 06/20/2019  . Influenza-Unspecified 06/20/2019  . PPD Test 08/23/2019  . Td 08/18/1998  . Tdap  03/11/2012   Pertinent  Health Maintenance Due  Topic Date Due  . PAP SMEAR-Modifier  Never done  . MAMMOGRAM  Never done  . COLONOSCOPY  Never done  . INFLUENZA VACCINE  03/18/2020   No flowsheet data found. Functional Status Survey:    Vitals:   11/17/19 1424  BP: (!) 116/55  Pulse: 64  Resp: 16  Temp: 98.2 F (36.8 C)  TempSrc: Oral  SpO2: 94%  Weight: 163 lb 12.8 oz (74.3 kg)  Height: 5\' 6"  (1.676 m)   Body mass index is 26.44 kg/m. Physical Exam   In general this is a pleasant middle-aged female in no distress lying comfortably in bed.  Her skin is warm and dry she is.  Eyes visual acuity appears to be intact she has prescription lenses sclera and conjunctive are clear.  Oropharynx is clear mucous  membranes moist.  Chest is clear to auscultation there is no labored breathing.  Heart is regular rate and rhythm without murmur gallop or rub she has baseline mild lower extremity edema.  Abdomen is somewhat protuberant it is soft nontender with positive bowel sounds.  Musculoskeletal moves upper extremities at baseline has lower extremity weakness she currently has protective boots on her lower legs bilaterally.  Neurologic appears grossly intact her speech is clear cranial nerves appear to be intact.  Psych she continues to be alert and oriented very pleasant and appropriate.    Labs reviewed:  November 16, 2019.  WBC 8.4 hemoglobin 10.6 platelets 462.   sodium 142 potassium 4.3 BUN 8 creatinine 0.61.  November 13, 2019.  Alkaline phosphatase 48.  Bilirubin 0.3.  Albumin 3.4.  ALT 18.  AST 70   Recent Labs    10/01/19 2151 10/10/19 0000  NA 143 140  K 3.4* 5.0  CL 106 102  CO2 25 30*  GLUCOSE 116*  --   BUN 10 18  CREATININE 0.88 0.8  CALCIUM 9.4 9.4   Recent Labs    10/01/19 2151 10/02/19 0000  AST 18 14  ALT 15 10  ALKPHOS 52 46  BILITOT 0.7  --   PROT 7.1  --   ALBUMIN 3.7 3.6   Recent Labs    10/01/19 2151 10/10/19 0000   WBC 21.6* 9.8  NEUTROABS 18.9*  --   HGB 11.7* 13.1  HCT 37.8 40  MCV 86.3  --   PLT 335 391   No results found for: TSH No results found for: HGBA1C No results found for: CHOL, HDL, LDLCALC, LDLDIRECT, TRIG, CHOLHDL  Significant Diagnostic Results in last 30 days:  DG Chest Port 1 View  Result Date: 10/01/2019 CLINICAL DATA:  Back pain, nausea and vomiting, weakness, hypoxia EXAM: PORTABLE CHEST 1 VIEW COMPARISON:  07/23/2019 FINDINGS: Single frontal view of the chest demonstrates a stable cardiac silhouette. No airspace disease, effusion, or pneumothorax. No acute bony abnormalities. IMPRESSION: 1. Stable exam, no acute process. Electronically Signed   By: Randa Ngo M.D.   On: 10/01/2019 22:24    Assessment/Plan  #1 history of UTI she continues on Levaquin 7 50 mg a day for 5 additional days she was initially treated with meropenem at this point appears to be stable she is afebrile does not complain of dysuria she does have follow-up with urology in 1 week.  2.  History of C. difficile this apparently has resolved-she had recurrent diarrhea here in the facility white count was high but was trending down on vancomycin.  She is no longer on the vancomycin she feels she is having formed bowel movements.  3.  History of multiple sclerosis at this point appears to be at baseline continue supportive care she is on baclofen during the day and has Zanaflex at night.  4.  History of pleural effusion left side she previously had a thoracentesis-this was treated as well with IV Lasix on previous hospitalization.  At this point she appears stable does not complain of any shortness of breath chest pain oxygen saturations have been satisfactory-she does not complain of increasing edema from baseline.  5.  History of hypertension she is on Norvasc 5 mg a day and Toprol-XL 25 mg a day blood pressure today is 110/65 at this point will continue to monitor.  6.  History of irritable bowel  syndrome she continues on Bentyl 20 mg 4 times daily this appears to be stable.  7.  History of depression she continues on Prozac 20 mg a day she continues to be bright and in good spirits today.  8.  History of hyperlipidemia not stated as uncontrolled she is on Pravachol 40 mg daily.  [id number] note greater than 35 minutes spent assessing patient-reviewing her chart and labs-and coordinating formulating a plan of care-of note greater than 50% of time spent coordinating a plan of care with input as noted above

## 2019-11-21 ENCOUNTER — Non-Acute Institutional Stay (SKILLED_NURSING_FACILITY): Payer: Medicare Other | Admitting: Internal Medicine

## 2019-11-21 ENCOUNTER — Encounter: Payer: Self-pay | Admitting: Internal Medicine

## 2019-11-21 DIAGNOSIS — A419 Sepsis, unspecified organism: Secondary | ICD-10-CM | POA: Diagnosis not present

## 2019-11-21 DIAGNOSIS — N319 Neuromuscular dysfunction of bladder, unspecified: Secondary | ICD-10-CM | POA: Diagnosis not present

## 2019-11-21 DIAGNOSIS — F32A Depression, unspecified: Secondary | ICD-10-CM

## 2019-11-21 DIAGNOSIS — I10 Essential (primary) hypertension: Secondary | ICD-10-CM | POA: Diagnosis not present

## 2019-11-21 DIAGNOSIS — N390 Urinary tract infection, site not specified: Secondary | ICD-10-CM

## 2019-11-21 DIAGNOSIS — F329 Major depressive disorder, single episode, unspecified: Secondary | ICD-10-CM

## 2019-11-21 DIAGNOSIS — G35 Multiple sclerosis: Secondary | ICD-10-CM | POA: Diagnosis not present

## 2019-11-21 DIAGNOSIS — E785 Hyperlipidemia, unspecified: Secondary | ICD-10-CM

## 2019-11-21 LAB — CBC AND DIFFERENTIAL
HCT: 34 — AB (ref 36–46)
Hemoglobin: 11 — AB (ref 12.0–16.0)
Neutrophils Absolute: 6
Platelets: 450 — AB (ref 150–399)
WBC: 8.1

## 2019-11-21 LAB — COMPREHENSIVE METABOLIC PANEL WITH GFR
GFR calc Af Amer: 90
GFR calc non Af Amer: 90

## 2019-11-21 LAB — BASIC METABOLIC PANEL WITH GFR
BUN: 13 (ref 4–21)
CO2: 27 — AB (ref 13–22)
Chloride: 105 (ref 99–108)
Creatinine: 0.7 (ref 0.5–1.1)
Glucose: 73
Potassium: 4 (ref 3.4–5.3)
Sodium: 144 (ref 137–147)

## 2019-11-21 LAB — CBC: RBC: 4.16 (ref 3.87–5.11)

## 2019-11-21 NOTE — Progress Notes (Signed)
: Provider:  Hennie Duos., MD Location:  [facility] Room Number: 111-P Place of Service:  SNF (31)  PCP: Patient, No Pcp Per Patient Care Team: Patient, No Pcp Per as PCP - General (General Practice)  Extended Emergency Contact Information Primary Emergency Contact: [name] Mobile Phone: [phone] Relation: Other Secondary Emergency Contact: [name] Mobile Phone: [phone] Relation: Other     Allergies: Patient has no known allergies.  Chief Complaint  Patient presents with  . Readmit To SNF    Readmission to [facility] SNF    HPI: Patient is a 64 y.o. female with history of complicated UTIs, MS in remission, hypertension, B12 deficiency, hyperlipidemia, anxiety and depression, who presented to [facility] ED with fever and chills for 3 to 4 days.  In ED temperature was 100.7 O2 saturation 93% on room air and labs notable for WBC of 16.7 and UA consistent with UTI.  Patient was admitted to [facility] from 3/28-4/1 where she was treated for sepsis secondary to complicated UTI.  Patient was started on meropenem and sepsis resolved with IV fluid resuscitation and antibiotics.  Patient is admitted back to SNF for residential care.  While at skilled nursing facility patient will be followed for hypertension treated with Norvasc and metoprolol, ocrelizumab for MS and depression treated with Prozac.   Past Medical History:  Diagnosis Date  . Actinic keratosis   . BCC (basal cell carcinoma of skin)   . Closed fracture of second metatarsal bone of right foot   . Closed fracture of third metatarsal bone of right foot with routine healing   . IBS (irritable bowel syndrome)   . Multiple sclerosis ([place])   . Neurogenic bladder   . Rectal polyp    Tubular adenoma  . Recurrent UTI   . SCC (squamous cell carcinoma)     Past Surgical History:  Procedure Laterality Date  . BOTOX INJECTION    . BREAST  BIOPSY    . COLONOSCOPY    . SKIN CANCER EXCISION      Allergies as of 11/21/2019   No Known Allergies     Medication List       Accurate as of November 21, 2019  3:59 PM. If you have any questions, ask your nurse or doctor.        acetaminophen 325 MG tablet Commonly known as: TYLENOL Take 325 mg by mouth every 4 (four) hours as needed for mild pain, fever or headache.   amLODipine 5 MG tablet Commonly known as: NORVASC Take 5 mg by mouth daily.   baclofen 20 MG tablet Commonly known as: LIORESAL Take 20 mg by mouth 3 (three) times daily.   baclofen 10 MG tablet Commonly known as: LIORESAL Take 30 mg by mouth at bedtime. Take 3 tablets to = 30 mg   cholecalciferol 25 MCG (1000 UNIT) tablet Commonly known as: VITAMIN D Take 1,000 Units by mouth daily.   Culturelle Caps Take 1 capsule by mouth daily. 10B cell   dicyclomine 20 MG tablet Commonly known as: BENTYL Take 20 mg by mouth in the morning, at noon, in the evening, and at bedtime.   Ensure Take 237 mLs by mouth 2 (two) times daily between meals. poor appetite (prefers chocolate and over ice).   FLUoxetine 20 MG capsule Commonly known as: PROZAC Take 20 mg by mouth daily.   levofloxacin 750 MG tablet Commonly known as: LEVAQUIN Take 750 mg  by mouth daily.   LORazepam 0.5 MG tablet Commonly known as: ATIVAN Take 0.5 mg by mouth every 8 (eight) hours.   metoprolol succinate 25 MG 24 hr tablet Commonly known as: TOPROL-XL Take 25 mg by mouth daily.   Ocrevus 300 MG/10ML injection Generic drug: ocrelizumab Inject 600 mg into the vein every 6 (six) months.   ondansetron 4 MG tablet Commonly known as: ZOFRAN Take 4 mg by mouth every 6 (six) hours as needed for nausea or vomiting. Notify MD if symptoms persist.   pravastatin 40 MG tablet Commonly known as: PRAVACHOL Take 40 mg by mouth daily.   tamsulosin 0.4 MG Caps capsule Commonly known as: FLOMAX Take 0.4 mg by mouth daily.   tiZANidine 2 MG  tablet Commonly known as: ZANAFLEX Take 2 mg by mouth at bedtime.   vitamin B-12 1000 MCG tablet Commonly known as: CYANOCOBALAMIN Take 1,000 mcg by mouth daily.       No orders of the defined types were placed in this encounter.   Immunization History  Administered Date(s) Administered  . Influenza, High Dose Seasonal PF 07/01/2018  . Influenza,inj,Quad PF,6+ Mos 05/20/2013, 05/26/2014, 06/18/2015, 06/18/2016, 06/29/2017, 06/20/2019  . Influenza-Unspecified 06/20/2019  . PPD Test 08/23/2019  . Td 08/18/1998  . Tdap 03/11/2012    Social History   Tobacco Use  . Smoking status: Never Smoker  . Smokeless tobacco: Never Used  Substance Use Topics  . Alcohol use: Yes    Alcohol/week: 18.0 standard drinks    Types: 2 Glasses of wine, 16 Standard drinks or equivalent per week    Family history is   Family History  Problem Relation Age of Onset  . Hypertension Mother   . Hyperlipidemia Mother   . Breast cancer Mother   . Hypertension Father   . Heart attack Father   . Hypertension Brother   . Diabetes Brother   . Lung cancer Maternal Grandmother   . Heart attack Paternal Grandfather       Review of Systems  GENERAL:  no fevers, fatigue, appetite changes SKIN: No itching, or rash EYES: No eye pain, redness, discharge EARS: No earache, tinnitus, change in hearing NOSE: No congestion, drainage or bleeding  MOUTH/THROAT: No mouth or tooth pain, No sore throat RESPIRATORY: No cough, wheezing, SOB CARDIAC: No chest pain, palpitations, lower extremity edema  GI: No abdominal pain, No N/V/D or constipation, No heartburn or reflux  GU: No dysuria, frequency or urgency, or incontinence  MUSCULOSKELETAL: No unrelieved bone/joint pain NEUROLOGIC: No headache, dizziness or focal weakness PSYCHIATRIC: No c/o anxiety or sadness   Vitals:   11/21/19 1519  BP: 122/71  Pulse: 65  Resp: 19  Temp: (!) 97.5 F (36.4 C)  SpO2: 96%    SpO2 Readings from Last 1  Encounters:  11/21/19 96%   Body mass index is 26.44 kg/m.     Physical Exam  GENERAL APPEARANCE: Alert, conversant,  No acute distress.  SKIN: No diaphoresis rash HEAD: Normocephalic, atraumatic  EYES: Conjunctiva/lids clear. Pupils round, reactive. EOMs intact.  EARS: External exam WNL, canals clear. Hearing grossly normal.  NOSE: No deformity or discharge.  MOUTH/THROAT: Lips w/o lesions  RESPIRATORY: Breathing is even, unlabored. Lung sounds are clear   CARDIOVASCULAR: Heart RRR no murmurs, rubs or gallops. No peripheral edema.   GASTROINTESTINAL: Abdomen is soft, non-tender, not distended w/ normal bowel sounds. GENITOURINARY: Bladder non tender, not distended  MUSCULOSKELETAL: No abnormal joints or musculature NEUROLOGIC:  Cranial nerves 2-12 grossly intact. Moves all  extremities  PSYCHIATRIC: Mood and affect appropriate to situation, no behavioral issues  Patient Active Problem List   Diagnosis Date Noted  . Recurrent colitis due to Clostridium difficile 11/13/2019  . Sepsis secondary to UTI ([place]) 10/12/2019  . Clostridium difficile diarrhea 10/12/2019  . Fluid overload 10/12/2019  . Pleural effusion on left 10/12/2019  . Depression 10/12/2019  . Hypertension 07/29/2019  . Impaired mobility and activities of daily living 07/29/2019  . Neurogenic bladder 06/03/2018  . Hyperlipidemia 05/23/2013  . Irritable bowel syndrome 05/21/2011  . Multiple sclerosis ([place]) 05/21/2011      Labs reviewed: Basic Metabolic Panel:    Component Value Date/Time   NA 144 11/21/2019 0000   K 4.0 11/21/2019 0000   CL 105 11/21/2019 0000   CO2 27 (A) 11/21/2019 0000   GLUCOSE 116 (H) 10/01/2019 2151   BUN 13 11/21/2019 0000   CREATININE 0.7 11/21/2019 0000   CREATININE 0.88 10/01/2019 2151   CALCIUM 8.9 11/10/2019 0000   PROT 7.1 10/01/2019 2151   ALBUMIN 3.4 (A) 10/31/2019 0000   AST 7 (A) 10/31/2019 0000   ALT 6 (A) 10/31/2019 0000   ALKPHOS 52 10/31/2019 0000   BILITOT  0.7 10/01/2019 2151   GFRNONAA 90 11/21/2019 0000   GFRAA 90 11/21/2019 0000    Recent Labs    10/01/19 2151 10/10/19 0000 10/31/19 0000 10/31/19 0000 11/03/19 0000 11/10/19 0000 11/21/19 0000  NA 143   < > 141   < > 142 140 144  K 3.4*   < > 3.3*   < > 4.0 4.3 4.0  CL 106   < > 99   < > 101 101 105  CO2 25   < > 28*   < > 28* 27* 27*  GLUCOSE 116*  --   --   --   --   --   --   BUN 10   < > 14   < > 10 14 13   CREATININE 0.88   < > 0.7   < > 0.7 0.6 0.7  CALCIUM 9.4   < > 9.1  --  9.0 8.9  --    < > = values in this interval not displayed.   Liver Function Tests: Recent Labs    10/01/19 2151 10/02/19 0000 10/31/19 0000  AST 18 14 7*  ALT 15 10 6*  ALKPHOS 52 46 52  BILITOT 0.7  --   --   PROT 7.1  --   --   ALBUMIN 3.7 3.6 3.4*   Recent Labs    10/01/19 2151  LIPASE 38   No results for input(s): AMMONIA in the last 8760 hours. CBC: Recent Labs    10/01/19 2151 10/10/19 0000 11/03/19 0000 11/10/19 0000 11/21/19 0000  WBC 21.6*   < > 23.4 14.6 8.1  NEUTROABS 18.9*   < > 20 12 6   HGB 11.7*   < > 12.1 10.7* 11.0*  HCT 37.8   < > 38 33* 34*  MCV 86.3  --   --   --   --   PLT 335   < > 335 430* 450*   < > = values in this interval not displayed.   Lipid No results for input(s): CHOL, HDL, LDLCALC, TRIG in the last 8760 hours.  Cardiac Enzymes: No results for input(s): CKTOTAL, CKMB, CKMBINDEX, TROPONINI in the last 8760 hours. BNP: Recent Labs    10/01/19 2152  BNP 169.0*   No results found for: [facility]  No results found for: HGBA1C No results found for: TSH No results found for: VITAMINB12 No results found for: FOLATE No results found for: IRON, TIBC, FERRITIN  Imaging and Procedures obtained prior to SNF admission: DG Chest Port 1 View  Result Date: 10/01/2019 CLINICAL DATA:  Back pain, nausea and vomiting, weakness, hypoxia EXAM: PORTABLE CHEST 1 VIEW COMPARISON:  07/23/2019 FINDINGS: Single frontal view of the chest demonstrates a stable  cardiac silhouette. No airspace disease, effusion, or pneumothorax. No acute bony abnormalities. IMPRESSION: 1. Stable exam, no acute process. Electronically Signed   By: Randa Ngo M.D.   On: 10/01/2019 continue     Not all labs, radiology exams or other studies done during hospitalization come through on my EPIC note; however they are reviewed by me.    Assessment and Plan  Sepsis secondary to UTI/neurogenic bladder -treated with meropenem and IV fluid resuscitation; was seen by urology, no In-N-Out cath needed at this time SNF -OT/PT; Levaquin 750 mg daily for 5 days; continue Flomax 0.4 mg daily and follow-up with urology  Hypertension SNF-controlled; continue Norvasc 5 mg daily and metoprolol XL 25 mg daily  MS SNF-continue ocrelizumab 600 mg injected every 6 months  Depression SNF-continue Prozac 20 mg daily  Hyperlipidemia SNF-continue Pravachol 40 mg daily   Time spent greater than 35 minutes;> 50% of time with patient was spent reviewing records, labs, tests and studies, counseling and developing plan of care  Hennie Duos, MD

## 2019-11-26 ENCOUNTER — Encounter: Payer: Self-pay | Admitting: Internal Medicine

## 2019-11-28 ENCOUNTER — Encounter: Payer: Self-pay | Admitting: Internal Medicine

## 2019-11-28 ENCOUNTER — Non-Acute Institutional Stay (SKILLED_NURSING_FACILITY): Payer: Medicare Other | Admitting: Internal Medicine

## 2019-11-28 DIAGNOSIS — A0471 Enterocolitis due to Clostridium difficile, recurrent: Secondary | ICD-10-CM

## 2019-11-28 NOTE — Progress Notes (Signed)
Location:  [place] Room Number: 106-P Place of Service:  SNF (31)  Patient, No Pcp Per  Patient Care Team: Patient, No Pcp Per as PCP - General (General Practice)  Extended Emergency Contact Information Primary Emergency Contact: [name] Mobile Phone: [phone] Relation: Other Secondary Emergency Contact: [name] Mobile Phone: [phone] Relation: Other    Allergies: Patient has no known allergies.  Chief Complaint  Patient presents with  . Acute Visit    Patient is seen for diarrhea.    HPI: Patient is a 64 y.o. female who who was being treated for her second electrolyte of C. difficile diarrhea with 8 weeks of vancomycin when she went to the hospital and her vancomycin was stopped.  Now patient is having diarrhea again with the same odor and some nausea.  No fever.  Past Medical History:  Diagnosis Date  . Actinic keratosis   . BCC (basal cell carcinoma of skin)   . Closed fracture of second metatarsal bone of right foot   . Closed fracture of third metatarsal bone of right foot with routine healing   . IBS (irritable bowel syndrome)   . Multiple sclerosis ([place])   . Neurogenic bladder   . Rectal polyp    Tubular adenoma  . Recurrent UTI   . SCC (squamous cell carcinoma)     Past Surgical History:  Procedure Laterality Date  . BOTOX INJECTION    . BREAST BIOPSY    . COLONOSCOPY    . SKIN CANCER EXCISION      Allergies as of 11/28/2019   No Known Allergies     Medication List       Accurate as of November 28, 2019 11:59 PM. If you have any questions, ask your nurse or doctor.        acetaminophen 325 MG tablet Commonly known as: TYLENOL Take 325 mg by mouth every 4 (four) hours as needed for mild pain, fever or headache.   amLODipine 5 MG tablet Commonly known as: NORVASC Take 5 mg by mouth daily.   baclofen 20 MG tablet Commonly known as: LIORESAL Take 20 mg by mouth 3 (three) times daily.   baclofen 10 MG tablet Commonly known as: LIORESAL Take 30 mg by mouth at bedtime. Take 3 tablets to = 30 mg   cholecalciferol 25 MCG (1000 UNIT) tablet Commonly known as: VITAMIN D Take 1,000 Units by mouth daily.   Culturelle Caps Take 1 capsule by mouth daily. 10B cell   dicyclomine 20 MG tablet Commonly known as: BENTYL Take 20 mg by mouth in the morning, at noon, in the evening, and at bedtime.   Ensure Take 237 mLs by mouth 2 (two) times daily between meals. poor appetite (prefers chocolate and over ice).   FLUoxetine 20 MG capsule Commonly known as: PROZAC Take 20 mg by mouth daily.   LORazepam 0.5 MG tablet Commonly known as: ATIVAN Take 0.5 mg by mouth every 8 (eight) hours.   LORazepam 0.5 MG tablet Commonly known as: ATIVAN Take 0.5 mg by mouth every 8 (eight) hours.   metoprolol succinate 25 MG 24 hr tablet Commonly known as: TOPROL-XL Take 25 mg by mouth daily.   Ocrevus 300 MG/10ML injection Generic drug: ocrelizumab Inject 600 mg into the vein every 6 (six) months.   ondansetron 4 MG tablet Commonly known as: ZOFRAN Take 4 mg by mouth every 6 (six) hours as needed for nausea or vomiting. Notify MD if symptoms persist.  pravastatin 40 MG tablet Commonly known as: PRAVACHOL Take 40 mg by mouth daily.   tamsulosin 0.4 MG Caps capsule Commonly known as: FLOMAX Take 0.4 mg by mouth daily.   tiZANidine 2 MG tablet Commonly known as: ZANAFLEX Take 2 mg by mouth at bedtime.   vancomycin 125 MG capsule Commonly known as: VANCOCIN Take 125 mg by mouth 4 (four) times daily.   vitamin B-12 1000 MCG tablet Commonly known as: CYANOCOBALAMIN Take 1,000 mcg by mouth daily.       No orders of the defined types were placed in this encounter.   Immunization History  Administered Date(s) Administered  . Influenza, High Dose Seasonal PF 07/01/2018  . Influenza,inj,Quad PF,6+ Mos 05/20/2013, 05/26/2014, 06/18/2015, 06/18/2016, 06/29/2017, 06/20/2019   . Influenza-Unspecified 06/20/2019  . PPD Test 08/23/2019  . Td 08/18/1998  . Tdap 03/11/2012    Social History   Tobacco Use  . Smoking status: Never Smoker  . Smokeless tobacco: Never Used  Substance Use Topics  . Alcohol use: Yes    Alcohol/week: 18.0 standard drinks    Types: 2 Glasses of wine, 16 Standard drinks or equivalent per week    Review of Systems  GENERAL:  no fevers, fatigue, appetite changes SKIN: No itching, rash HEENT: No complaint RESPIRATORY: No cough, wheezing, SOB CARDIAC: No chest pain, palpitations, lower extremity edema  GI: No abdominal pain, + nausea and diarrhea, no constipation, No heartburn or reflux  GU: No dysuria, frequency or urgency, or incontinence  MUSCULOSKELETAL: No unrelieved bone/joint pain NEUROLOGIC: No headache, dizziness  PSYCHIATRIC: No overt anxiety or sadness  Vitals:   11/28/19 1636  BP: 111/70  Pulse: 90  Resp: 18  Temp: 98.1 F (36.7 C)  SpO2: 96%   Body mass index is 26.44 kg/m. Physical Exam  GENERAL APPEARANCE: Alert, conversant, No acute distress  SKIN: No diaphoresis rash HEENT: Unremarkable RESPIRATORY: Breathing is even, unlabored. Lung sounds are clear   CARDIOVASCULAR: Heart RRR no murmurs, rubs or gallops. No peripheral edema  GASTROINTESTINAL: Abdomen is soft, non-tender, not distended w/ normal bowel sounds.  GENITOURINARY: Bladder non tender, not distended  MUSCULOSKELETAL: No abnormal joints or musculature NEUROLOGIC: Cranial nerves 2-12 grossly intact. Moves all extremities PSYCHIATRIC: Mood and affect appropriate to situation, no behavioral issues  Patient Active Problem List   Diagnosis Date Noted  . Recurrent colitis due to Clostridium difficile 11/13/2019  . Sepsis secondary to UTI ([place]) 10/12/2019  . Clostridium difficile diarrhea 10/12/2019  . Fluid overload 10/12/2019  . Pleural effusion on left 10/12/2019  . Depression 10/12/2019  . Hypertension 07/29/2019  . Impaired mobility  and activities of daily living 07/29/2019  . Neurogenic bladder 06/03/2018  . Hyperlipidemia 05/23/2013  . Irritable bowel syndrome 05/21/2011  . Multiple sclerosis ([place]) 05/21/2011    CMP     Component Value Date/Time   NA 144 11/21/2019 0000   K 4.0 11/21/2019 0000   CL 105 11/21/2019 0000   CO2 27 (A) 11/21/2019 0000   GLUCOSE 116 (H) 10/01/2019 2151   BUN 13 11/21/2019 0000   CREATININE 0.7 11/21/2019 0000   CREATININE 0.88 10/01/2019 2151   CALCIUM 8.9 11/10/2019 0000   PROT 7.1 10/01/2019 2151   ALBUMIN 3.4 (A) 10/31/2019 0000   AST 7 (A) 10/31/2019 0000   ALT 6 (A) 10/31/2019 0000   ALKPHOS 52 10/31/2019 0000   BILITOT 0.7 10/01/2019 2151   GFRNONAA 90 11/21/2019 0000   GFRAA 90 11/21/2019 0000   Recent Labs    10/01/19  2151 10/10/19 0000 10/31/19 0000 10/31/19 0000 11/03/19 0000 11/10/19 0000 11/21/19 0000  NA 143   < > 141   < > 142 140 144  K 3.4*   < > 3.3*   < > 4.0 4.3 4.0  CL 106   < > 99   < > 101 101 105  CO2 25   < > 28*   < > 28* 27* 27*  GLUCOSE 116*  --   --   --   --   --   --   BUN 10   < > 14   < > 10 14 13   CREATININE 0.88   < > 0.7   < > 0.7 0.6 0.7  CALCIUM 9.4   < > 9.1  --  9.0 8.9  --    < > = values in this interval not displayed.   Recent Labs    10/01/19 2151 10/02/19 0000 10/31/19 0000  AST 18 14 7*  ALT 15 10 6*  ALKPHOS 52 46 52  BILITOT 0.7  --   --   PROT 7.1  --   --   ALBUMIN 3.7 3.6 3.4*   Recent Labs    10/01/19 2151 10/10/19 0000 11/03/19 0000 11/10/19 0000 11/21/19 0000  WBC 21.6*   < > 23.4 14.6 8.1  NEUTROABS 18.9*   < > 20 12 6   HGB 11.7*   < > 12.1 10.7* 11.0*  HCT 37.8   < > 38 33* 34*  MCV 86.3  --   --   --   --   PLT 335   < > 335 430* 450*   < > = values in this interval not displayed.   No results for input(s): CHOL, LDLCALC, TRIG in the last 8760 hours.  Invalid input(s): HCL No results found for: MICROALBUR No results found for: TSH No results found for: HGBA1C No results found for:  CHOL, HDL, LDLCALC, LDLDIRECT, TRIG, CHOLHDL  Significant Diagnostic Results in last 30 days:  No results found.  Assessment and Plan  C. difficile diarrhea, second recurrence-I guess technically this is still be second recurrence since it was not completely treated secondary to treatment being stopped by the hospitalists.  Patient was 3 weeks in the treatment, will have to restart from the beginning again.  Second recurrence treatment requires 105 mg vancomycin p.o. every 6 hours and then tapers continuously for total of 6 to 8 weeks of treatment.  Patient is aware and understands.    Hennie Duos, MD

## 2019-12-03 ENCOUNTER — Encounter: Payer: Self-pay | Admitting: Internal Medicine

## 2019-12-12 ENCOUNTER — Other Ambulatory Visit: Payer: Self-pay | Admitting: Internal Medicine

## 2019-12-12 ENCOUNTER — Non-Acute Institutional Stay (SKILLED_NURSING_FACILITY): Payer: Medicare Other | Admitting: Internal Medicine

## 2019-12-12 DIAGNOSIS — F32A Depression, unspecified: Secondary | ICD-10-CM

## 2019-12-12 DIAGNOSIS — I10 Essential (primary) hypertension: Secondary | ICD-10-CM | POA: Diagnosis not present

## 2019-12-12 DIAGNOSIS — K580 Irritable bowel syndrome with diarrhea: Secondary | ICD-10-CM

## 2019-12-12 DIAGNOSIS — A0472 Enterocolitis due to Clostridium difficile, not specified as recurrent: Secondary | ICD-10-CM | POA: Diagnosis not present

## 2019-12-12 DIAGNOSIS — Z79899 Other long term (current) drug therapy: Secondary | ICD-10-CM | POA: Diagnosis not present

## 2019-12-12 DIAGNOSIS — F329 Major depressive disorder, single episode, unspecified: Secondary | ICD-10-CM

## 2019-12-12 DIAGNOSIS — G35 Multiple sclerosis: Secondary | ICD-10-CM | POA: Diagnosis not present

## 2019-12-12 MED ORDER — LORAZEPAM 0.5 MG PO TABS: 0.5000 mg | ORAL_TABLET | Freq: Every day | ORAL | 0 refills | Status: DC | PRN

## 2019-12-12 NOTE — Progress Notes (Signed)
This is an acute visit.  Level of care skilled.  [facility]ility [facility]  Chief complaint acute visit secondary to medication questions-anxiety.  As well as follow-up C. difficile  History of present illness.  Patient is a 64 year old female who is here for rehab-she has a history of complicated UTIs as well as multiple sclerosis in remission as well as hypertension B12 deficiency hyperlipidemia anxiety and depression.  During her most recent hospitalization she presented to the ER with fever and chills-she was treated for sepsis secondary to complicated UTI and was started on meropenem and sepsis resolved with IV fluids and the antibiotics.  Previous to that she had been treated for C. difficile.  She was on a course of vancomycin but hospitalization interrupted this.  In a few days after arriving back to the facility she started having diarrhea again and did test positive for C. difficile.  She is on a prolonged course of vancomycin for reoccurrence currently on 125 mg twice daily this will be titrated to once a day at the end of the month.-She appears to be doing well in this regards plana fever chills abdominal pain or persistent diarrhea  She has some requests today about the timing of her medications and we did discuss this pretty extensively today.  She is on numerous medications for MS as well as irritable bowel syndrome hypertension and hyperlipidemia.  And depression  She has been used to taking these medications in a set order at breakfast lunch dinner and at bedtime at home--- and would like a comparable schedule here in the facility-she says it would be less confusing to her.  After pretty extensive discussion we did come in with a plan-she said she would like to get her 2 different muscle relaxers spaced apart at night which is certainly preferable.  She does receive baclofen 30 mg nightly and then Zanaflex 2 mg as well.  She says at home she takes the separated  by an hour-hour and a half which we will do.  She also would like her Zanaflex reduced which certainly I would be in favor of.  She does continue on Bentylwith a history of IBS she receives this 4 times a day.  She also is on Norvasc for hypertension 5 mg a day.  She also is on Flomax 0.4 mg a day with a history of UTIs and this has been followed by urology.  For MS she continues on Ocrelizumab 600 mg injection every 6 months.  She also has a history of depression with anxiety she is on Prozac 20 mg a day she would like this in the morning.  In addition to the baclofen and Zanaflex at night she does receive baclofen 20 mg 3 times daily during the day as well.  For hypertension she is on Toprol-XL 25 mg a day as well as Norvasc 5 mg a day.  She also is on vitamin D 1000 units daily-she was wondering if this needs to be increased and we will check a vitamin D level to see where we stand.  Currently she is sitting in her chair comfortably-her main concerns are getting her medications in a manner that works for her.  She was on as needed Ativan she says she could use this yet-she has completed her 14-day run-she says at times she does get very anxious and feels she would like to have this continued in some form.   Past Medical History:  Diagnosis Date  . Actinic  keratosis   . BCC (basal cell carcinoma of skin)   . Closed fracture of second metatarsal bone of right foot   . Closed fracture of third metatarsal bone of right foot with routine healing   . IBS (irritable bowel syndrome)   . Multiple sclerosis ([place])   . Neurogenic bladder   . Rectal polyp    Tubular adenoma  . Recurrent UTI   . SCC (squamous cell carcinoma)          Past Surgical History:  Procedure Laterality Date  . BOTOX INJECTION    . BREAST BIOPSY    . COLONOSCOPY    . SKIN CANCER EXCISION      Allergies as of 11/21/2019   No Known Allergies        Medication List              acetaminophen 325 MG tablet Commonly known as: TYLENOL Take 325 mg by mouth every 4 (four) hours as needed for mild pain, fever or headache.   amLODipine 5 MG tablet Commonly known as: NORVASC Take 5 mg by mouth daily.   baclofen 20 MG tablet Commonly known as: LIORESAL Take 20 mg by mouth 3 (three) times daily.   baclofen 10 MG tablet Commonly known as: LIORESAL Take 30 mg by mouth at bedtime. Take 3 tablets to = 30 mg   cholecalciferol 25 MCG (1000 UNIT) tablet Commonly known as: VITAMIN D Take 1,000 Units by mouth daily.   Culturelle Caps Take 1 capsule by mouth daily. 10B cell   dicyclomine 20 MG tablet Commonly known as: BENTYL Take 20 mg by mouth in the morning, at noon, in the evening, and at bedtime.   Ensure Take 237 mLs by mouth 2 (two) times daily between meals. poor appetite (prefers chocolate and over ice).   FLUoxetine 20 MG capsule Commonly known as: PROZAC Take 20 mg by mouth daily.        metoprolol succinate 25 MG 24 hr tablet Commonly known as: TOPROL-XL Take 25 mg by mouth daily.   Ocrevus 300 MG/10ML injection Generic drug: ocrelizumab Inject 600 mg into the vein every 6 (six) months.   ondansetron 4 MG tablet Commonly known as: ZOFRAN Take 4 mg by mouth every 6 (six) hours as needed for nausea or vomiting. Notify MD if symptoms persist.   pravastatin 40 MG tablet Commonly known as: PRAVACHOL Take 40 mg by mouth daily.   tamsulosin 0.4 MG Caps capsule Commonly known as: FLOMAX Take 0.4 mg by mouth daily.   tiZANidine 2 MG tablet Commonly known as: ZANAFLEX Take 2 mg by mouth at bedtime.   vitamin B-12 1000 MCG tablet Commonly known as: CYANOCOBALAMIN Take 1,000 mcg by mouth daily.       No orders of the defined types were placed in this encounter.       Immunization History  Administered Date(s) Administered  . Influenza, High Dose Seasonal PF 07/01/2018  . Influenza,inj,Quad PF,6+ Mos  05/20/2013, 05/26/2014, 06/18/2015, 06/18/2016, 06/29/2017, 06/20/2019  . Influenza-Unspecified 06/20/2019  . PPD Test 08/23/2019  . Td 08/18/1998  . Tdap 03/11/2012    Social History        Tobacco Use  . Smoking status: Never Smoker  . Smokeless tobacco: Never Used  Substance Use Topics  . Alcohol use: Yes    Alcohol/week: 18.0 standard drinks    Types: 2 Glasses of wine, 16 Standard drinks or equivalent per week    Family history is  Family History  Problem Relation Age of Onset  . Hypertension Mother   . Hyperlipidemia Mother   . Breast cancer Mother   . Hypertension Father   . Heart attack Father   . Hypertension Brother   . Diabetes Brother   . Lung cancer Maternal Grandmother   . Heart attack Paternal Grandfather       Review of Systems  In general she does not complain of any fever or chills.  Skin does not complain of rashes itching or diaphoresis.  Head ears eyes nose mouth and throat is not complaining of visual changes or sore throat.  Respiratory does not complain of being short of breath or having a cough.  Cardiac does not complain of chest pain palpitations or increased edema from baseline.  GI is not complaining of abdominal pain at this point nausea vomiting constipation-diarrhea appears to be improving.  GU is not complaining of dysuria at this time.  Musculoskeletal does not complain of joint pain at this time she does have a history of muscle spasms pain with a history of MS.  Neurologic as noted above does not complain of dizziness headache or numbness.  Psych-- does have a history of depression on Prozac also says she has anxiety and would like her Ativan back in some form--appears to be in good spirits does not complain of being depressed today   Physical exam.  Temperature is 98.2 pulse 79 respirations 18 blood pressure 115/67.  In general this is a pleasant female in no distress sitting comfortably in  her chair.  Her skin is warm and dry.  Eyes visual acuity appears to be intact sclera and conjunctive are clear.  Oropharynx clear mucous membranes moist.  Chest is clear to auscultation there is no labored breathing.  Heart is regular rate and rhythm without murmur gallop or rub she has baseline lower extremity edema.  Abdomen is obese soft nontender with positive bowel sounds.  Musculoskeletal does move all extremities x4 it appears at baseline.  Neurologic is grossly intact her speech is clear cannot really appreciate lateralizing findings she does have lower extremity weakness with history of MS.  Psych she is alert and oriented pleasant and appropriate.   Labs.  November 21, 2019.  WBC 8.1 hemoglobin 11.0 platelets 450.  Sodium 144 potassium 4 BUN 12.6 creatinine 0.69.  Recent Labs    10/01/19 2151 10/10/19 0000 10/31/19 0000 10/31/19 0000 11/03/19 0000 11/10/19 0000 11/21/19 0000  NA 143   < > 141   < > 142 140 144  K 3.4*   < > 3.3*   < > 4.0 4.3 4.0  CL 106   < > 99   < > 101 101 105  CO2 25   < > 28*   < > 28* 27* 27*  GLUCOSE 116*  --   --   --   --   --   --   BUN 10   < > 14   < > 10 14 13   CREATININE 0.88   < > 0.7   < > 0.7 0.6 0.7  CALCIUM 9.4   < > 9.1  --  9.0 8.9  --    < > = values in this interval not displayed.     Recent Labs (within last 365 days)       Recent Labs    10/01/19 2151 10/02/19 0000 10/31/19 0000  AST 18 14 7*  ALT 15 10 6*  ALKPHOS 52 46  52  BILITOT 0.7  --   --   PROT 7.1  --   --   ALBUMIN 3.7 3.6 3.4*     Recent Labs (within last 365 days)         Recent Labs    10/01/19 2151 10/10/19 0000 11/03/19 0000 11/10/19 0000 11/21/19 0000  WBC 21.6*   < > 23.4 14.6 8.1  NEUTROABS 18.9*   < > 20 12 6   HGB 11.7*   < > 12.1 10.7* 11.0*  HCT 37.8   < > 38 33* 34*  MCV 86.3  --   --   --   --   PLT 335   < > 335 430* 450*   < > = values in this interval not displayed.     Assessment and plan.  1.  Medication  issues-as noted above there was an extensive discussion and well order medications in order that patient is used to-she says this leads to less confusion for her's procedure when she will be going home.  We will give baclofen 20 mg-vitamin D at 1000 mg Prozac 20 mg as well as Bentyl 20 mg Toprol-XL 25 mg pravastatin 40 mg and Norvasc 5 mg as well as Flomax 0.4 mg with her at breakfast time.  At lunch will get back from 20 mg-Bentyl 20 mg and vitamin B12 1000 mcg.  And at dinner will receive the baclofen 20 mg as well as Bentyl 20 mg.  At at bedtime she will receive the baclofen 30 mg dose which she has been getting as well as Bentyl 20 mg.  Approximately an hour-hour and a half after that we will receive the reduced dose of Zanaflex of 1 mg-apparently this is the lowest dose per pharmacy that is available.  2.  Had recent history of sepsis secondary to UTI neurogenic bladder she continues on Flomax 0.4 mg which she will receiving with breakfast-she has completed a course of Levaquin this appears to be stable.  2.  Recurrent C. difficile she is completing a course of vancomycin and will receive this as previously ordered twice daily.  Currently on 125 mg twice daily which will be reduced to daily at the end of the month.  Also will update a CBC and metabolic panel to keep an eye on her electrolytes and white count  3.-History of hypertension at this point appears to be stable she is on Norvasc 5 mg a day and Toprol-XL 25 mg a day which she will receive with her breakfast.  4.  History of MS she does have the orders for  ocrelizumab 600 mg injected every 6 months  She also has orders for the baclofen 20 mg which she will receive with breakfast lunch and dinner and at at bedtime-20 mg during the day and 30 mg at at bedtime.--She will receive the Zanaflex about an hour-an hour and a half later after receiving the baclofen at night-this is the way she takes it at home  5.  History of irritable  bowel she is on the been till which she will receive 20 mg at breakfast lunch dinner and at bedtime.  6.  History of depression she will be receiving the Prozac 20 mg in the morning with breakfast.  7.  History of hyperlipidemia she is on pravastatin 40 mg a day and she will be getting this in the morning as well.  8.  History of vitamin D deficiency-we will update a vitamin D level to see where  we stand-she is receiving vitamin D 1000 units in the a.m.  #9 history of anxiety-she says she does benefit from Ativan will restart this once a day at 0.5 mg and monitor-   told her that if she no longer needs it to refuse it and hopefully we can discontinue it at some point in the near future  [id number]-of note greater than 40 minutes spent assessing patient-discussing her concerns at bedside-reviewing her chart and labs and discussing her status with nursing-and coordinating and formulating a plan of care-of note greater than 50% of time spent coordinating a plan of care with input as noted above

## 2019-12-13 ENCOUNTER — Encounter: Payer: Self-pay | Admitting: Internal Medicine

## 2019-12-13 LAB — CBC AND DIFFERENTIAL
HCT: 31 — AB (ref 36–46)
Hemoglobin: 10.1 — AB (ref 12.0–16.0)
Platelets: 283 (ref 150–399)
WBC: 8.6

## 2019-12-13 LAB — COMPREHENSIVE METABOLIC PANEL
Calcium: 8.9 (ref 8.7–10.7)
GFR calc Af Amer: >90
GFR calc non Af Amer: >90

## 2019-12-13 LAB — BASIC METABOLIC PANEL
BUN: 15 (ref 4–21)
CO2: 25 — AB (ref 13–22)
Chloride: 105 (ref 99–108)
Creatinine: 0.7 (ref 0.5–1.1)
Glucose: 86
Potassium: 4.1 (ref 3.4–5.3)
Sodium: 142 (ref 137–147)

## 2019-12-13 LAB — CBC: RBC: 3.84 — AB (ref 3.87–5.11)

## 2019-12-13 LAB — VITAMIN D 25 HYDROXY (VIT D DEFICIENCY, FRACTURES): Vit D, 25-Hydroxy: 28.27

## 2019-12-19 ENCOUNTER — Encounter: Payer: Self-pay | Admitting: Internal Medicine

## 2019-12-19 ENCOUNTER — Non-Acute Institutional Stay (SKILLED_NURSING_FACILITY): Payer: Medicare Other | Admitting: Internal Medicine

## 2019-12-19 DIAGNOSIS — F410 Panic disorder [episodic paroxysmal anxiety] without agoraphobia: Secondary | ICD-10-CM | POA: Diagnosis not present

## 2019-12-19 NOTE — Progress Notes (Signed)
Location:  [facility] Room Number: 210-W Place of Service:  SNF (31)  Patient, No Pcp Per  Patient Care Team: Patient, No Pcp Per as PCP - General (General Practice)  Extended Emergency Contact Information Primary Emergency Contact: [name] Mobile Phone: [phone] Relation: Other Secondary Emergency Contact: [name] Mobile Phone: [phone] Relation: Other    Allergies: Patient has no known allergies.  Chief Complaint  Patient presents with  . Acute Visit    Patient is seen for anxiety.     HPI: Patient is a 64 y.o. female who is being seen today because she has episodes of anxiety.  Patient takes Ativan 0.5 mg for her anxiety when it occurs.  Does not occur daily sometimes not even weekly.  Occurs when it occurs.  Patient had an order for Ativan 0.5 mg daily as needed for 14 days but it has expired.  Last night an patient's patient patient's will use on call provider started a new order of Ativan 0.5 mg daily as needed for another 14 days.  Past Medical History:  Diagnosis Date  . Actinic keratosis   . BCC (basal cell carcinoma of skin)   . Closed fracture of second metatarsal bone of right foot   . Closed fracture of third metatarsal bone of right foot with routine healing   . IBS (irritable bowel syndrome)   . Multiple sclerosis ([place])   . Neurogenic bladder   . Rectal polyp    Tubular adenoma  . Recurrent UTI   . SCC (squamous cell carcinoma)     Past Surgical History:  Procedure Laterality Date  . BOTOX INJECTION    . BREAST BIOPSY    . COLONOSCOPY    . SKIN CANCER EXCISION      Allergies as of 12/19/2019   No Known Allergies     Medication List       Accurate as of Dec 19, 2019  9:39 PM. If you have any questions, ask your nurse or doctor.        acetaminophen 325 MG tablet Commonly known as: TYLENOL Take 325 mg by mouth every 4 (four) hours as needed for mild pain, fever or headache.     amLODipine 5 MG tablet Commonly known as: NORVASC Take 5 mg by mouth daily.   baclofen 20 MG tablet Commonly known as: LIORESAL Take 20 mg by mouth 3 (three) times daily.   baclofen 10 MG tablet Commonly known as: LIORESAL Take 30 mg by mouth at bedtime. Take 3 tablets to = 30 mg   dicyclomine 20 MG tablet Commonly known as: BENTYL Take 20 mg by mouth in the morning, at noon, in the evening, and at bedtime.   Ensure Take 237 mLs by mouth in the morning. Lactose reduced for poor appetite (prefers chocolate and over ice).   FLUoxetine 20 MG capsule Commonly known as: PROZAC Take 20 mg by mouth daily.   LORazepam 0.5 MG tablet Commonly known as: ATIVAN Take 1 tablet (0.5 mg total) by mouth daily as needed for anxiety (as needed for 14 days).   metoprolol succinate 25 MG 24 hr tablet Commonly known as: TOPROL-XL Take 25 mg by mouth daily.   Ocrevus 300 MG/10ML injection Generic drug: ocrelizumab Inject 600 mg into the vein every 6 (six) months.   ondansetron 4 MG tablet Commonly known as: ZOFRAN Take 4 mg by mouth every 6 (six) hours as needed for nausea or vomiting. Notify MD if symptoms persist.  pravastatin 40 MG tablet Commonly known as: PRAVACHOL Take 40 mg by mouth daily.   tamsulosin 0.4 MG Caps capsule Commonly known as: FLOMAX Take 0.4 mg by mouth daily.   tiZANidine 2 MG tablet Commonly known as: ZANAFLEX Take 1 mg by mouth at bedtime.   vancomycin 125 MG capsule Commonly known as: VANCOCIN Take 125 mg by mouth daily.   vitamin B-12 1000 MCG tablet Commonly known as: CYANOCOBALAMIN Take 1,000 mcg by mouth daily.   Vitamin D3 50 MCG (2000 UT) Chew Chew 1 tablet by mouth daily. What changed: Another medication with the same name was removed. Continue taking this medication, and follow the directions you see here. Changed by: Inocencio Homes, MD       No orders of the defined types were placed in this encounter.   Immunization History   Administered Date(s) Administered  . Influenza, High Dose Seasonal PF 07/01/2018  . Influenza,inj,Quad PF,6+ Mos 05/20/2013, 05/26/2014, 06/18/2015, 06/18/2016, 06/29/2017, 06/20/2019  . Influenza-Unspecified 06/20/2019  . PPD Test 08/23/2019  . Td 08/18/1998  . Tdap 03/11/2012    Social History   Tobacco Use  . Smoking status: Never Smoker  . Smokeless tobacco: Never Used  Substance Use Topics  . Alcohol use: Yes    Alcohol/week: 18.0 standard drinks    Types: 2 Glasses of wine, 16 Standard drinks or equivalent per week    Review of Systems  GENERAL:  no fevers, fatigue, appetite changes SKIN: No itching, rash HEENT: No complaint RESPIRATORY: No cough, wheezing, SOB CARDIAC: No chest pain, palpitations, lower extremity edema  GI: No abdominal pain, No N/V/D or constipation, No heartburn or reflux  GU: No dysuria, frequency or urgency, or incontinence  MUSCULOSKELETAL: No unrelieved bone/joint pain NEUROLOGIC: No headache, dizziness  PSYCHIATRIC: No overt anxiety or sadness  Vitals:   12/19/19 1631  BP: 104/62  Pulse: 78  Resp: 18  Temp: (!) 97.1 F (36.2 C)  SpO2: 96%   Body mass index is 27.45 kg/m. Physical Exam  GENERAL APPEARANCE: Alert, conversant, No acute distress  SKIN: No diaphoresis rash HEENT: Unremarkable RESPIRATORY: Breathing is even, unlabored. Lung sounds are clear   CARDIOVASCULAR: Heart RRR no murmurs, rubs or gallops. No peripheral edema  GASTROINTESTINAL: Abdomen is soft, non-tender, not distended w/ normal bowel sounds.  GENITOURINARY: Bladder non tender, not distended  MUSCULOSKELETAL: No abnormal joints or musculature NEUROLOGIC: Cranial nerves 2-12 grossly intact. Moves all extremities PSYCHIATRIC: Mood and affect appropriate to situation, no behavioral issues  Patient Active Problem List   Diagnosis Date Noted  . Recurrent colitis due to Clostridium difficile 11/13/2019  . Sepsis secondary to UTI ([place]) 10/12/2019  . Clostridium  difficile diarrhea 10/12/2019  . Fluid overload 10/12/2019  . Pleural effusion on left 10/12/2019  . Depression 10/12/2019  . Hypertension 07/29/2019  . Impaired mobility and activities of daily living 07/29/2019  . Neurogenic bladder 06/03/2018  . Hyperlipidemia 05/23/2013  . Irritable bowel syndrome 05/21/2011  . Multiple sclerosis ([place]) 05/21/2011    CMP     Component Value Date/Time   NA 144 11/21/2019 0000   K 4.0 11/21/2019 0000   CL 105 11/21/2019 0000   CO2 27 (A) 11/21/2019 0000   GLUCOSE 116 (H) 10/01/2019 2151   BUN 13 11/21/2019 0000   CREATININE 0.7 11/21/2019 0000   CREATININE 0.88 10/01/2019 2151   CALCIUM 8.9 11/10/2019 0000   PROT 7.1 10/01/2019 2151   ALBUMIN 3.4 (A) 10/31/2019 0000   AST 7 (A) 10/31/2019 0000  ALT 6 (A) 10/31/2019 0000   ALKPHOS 52 10/31/2019 0000   BILITOT 0.7 10/01/2019 2151   GFRNONAA 90 11/21/2019 0000   GFRAA 90 11/21/2019 0000   Recent Labs    10/01/19 2151 10/10/19 0000 10/31/19 0000 10/31/19 0000 11/03/19 0000 11/10/19 0000 11/21/19 0000  NA 143   < > 141   < > 142 140 144  K 3.4*   < > 3.3*   < > 4.0 4.3 4.0  CL 106   < > 99   < > 101 101 105  CO2 25   < > 28*   < > 28* 27* 27*  GLUCOSE 116*  --   --   --   --   --   --   BUN 10   < > 14   < > 10 14 13   CREATININE 0.88   < > 0.7   < > 0.7 0.6 0.7  CALCIUM 9.4   < > 9.1  --  9.0 8.9  --    < > = values in this interval not displayed.   Recent Labs    10/01/19 2151 10/02/19 0000 10/31/19 0000  AST 18 14 7*  ALT 15 10 6*  ALKPHOS 52 46 52  BILITOT 0.7  --   --   PROT 7.1  --   --   ALBUMIN 3.7 3.6 3.4*   Recent Labs    10/01/19 2151 10/10/19 0000 11/03/19 0000 11/10/19 0000 11/21/19 0000  WBC 21.6*   < > 23.4 14.6 8.1  NEUTROABS 18.9*   < > 20 12 6   HGB 11.7*   < > 12.1 10.7* 11.0*  HCT 37.8   < > 38 33* 34*  MCV 86.3  --   --   --   --   PLT 335   < > 335 430* 450*   < > = values in this interval not displayed.   No results for input(s): CHOL,  LDLCALC, TRIG in the last 8760 hours.  Invalid input(s): HCL No results found for: MICROALBUR No results found for: TSH No results found for: HGBA1C No results found for: CHOL, HDL, LDLCALC, LDLDIRECT, TRIG, CHOLHDL  Significant Diagnostic Results in last 30 days:  No results found.  Assessment and Plan  Episodic anxiety-patient does not have anxiety often to schedule her Ativan and it does not make sense to have her Ativan scheduled and have her refuse it on a regular basis.  What works best for this patient to have it on an as-needed basis.  She herself is not wanting to take it more than necessary.  Patient understands the risk of potential addiction, however as needed medication decreases that risk.    Hennie Duos, MD

## 2019-12-20 ENCOUNTER — Non-Acute Institutional Stay (SKILLED_NURSING_FACILITY): Payer: Medicare Other | Admitting: Internal Medicine

## 2019-12-20 ENCOUNTER — Encounter: Payer: Self-pay | Admitting: Internal Medicine

## 2019-12-20 DIAGNOSIS — F410 Panic disorder [episodic paroxysmal anxiety] without agoraphobia: Secondary | ICD-10-CM | POA: Insufficient documentation

## 2019-12-20 DIAGNOSIS — A0471 Enterocolitis due to Clostridium difficile, recurrent: Secondary | ICD-10-CM

## 2019-12-20 DIAGNOSIS — G35 Multiple sclerosis: Secondary | ICD-10-CM | POA: Diagnosis not present

## 2019-12-20 DIAGNOSIS — I10 Essential (primary) hypertension: Secondary | ICD-10-CM | POA: Diagnosis not present

## 2019-12-20 DIAGNOSIS — K580 Irritable bowel syndrome with diarrhea: Secondary | ICD-10-CM | POA: Diagnosis not present

## 2019-12-20 NOTE — Progress Notes (Signed)
Location:    [facility] Room Number: 210/W Place of Service:  SNF (31) Provider:  Lorne Skeens  Patient, No Pcp Per  Patient Care Team: Patient, No Pcp Per as PCP - General (General Practice)  Extended Emergency Contact Information Primary Emergency Contact: [name] Mobile Phone: [phone] Relation: Other Secondary Emergency Contact: [name] Mobile Phone: [phone] Relation: Other  Code Status:  Full Code Goals of care: Advanced Directive information Advanced Directives 12/20/2019  Does Patient Have a Medical Advance Directive? Yes  Type of Advance Directive (No Data)  Does patient want to make changes to medical advance directive? No - Patient declined     Chief Complaint  Patient presents with  . Medical Management of Chronic Issues    Routine visit of medical management   Including history of recurrent CHF-recent history of sepsis with UTI-also hypertension-history of MS-IBS-depression-hyperlipidemia vitamin D deficiency and anxiety  HPI:  Pt is a 64 y.o. female seen today for medical management of chronic diseases.  As noted above.  She was recently hospitalized for sepsis with UTI and has completed a course of Levaquin.  She does have a history of neurogenic bladder continues on Flomax 0.5 mg a day.  She appears stable on this regard.  This has been complicated with a history of recurrent C. difficile-she had been on a prolonged course of vancomycin but was hospitalized and it appears treatment was interrupted secondary to UTI.  She is currently developed recurrent diarrhea which tested positive for C. difficile and she is on a prolonged course of vancomycin-she no longer reports diarrhea which is encouraging but will need to complete the full course.  She also has a history of MS-and has orders for   ocrelizumab 600 mg injected every 6 months  She also has orders for the baclofen 20 mg which she  receive  with breakfast lunch and dinner and at at bedtime-20 mg during the day and 30 mg at at bedtime.--She receives the Zanaflex about an hour-an hour and a half later after receiving the baclofen at night-this is the way she takes it at home  She also has a history of hypertension she is on Norvasc 5 mg a day as well as Toprol-XL 25 mg a day-blood pressures appear to be stable with systolics ranging from [id number] recently I see occasional higher spikes but these are not consistent  For irritable bowel syndrome she is on Bentyl 20 mg 4 times a day.  She also has a history of depression and occasional anxiety she is on Prozac 20 mg a day-she also has an order for Ativan as needed once a day she says she does not usually need this but would like it on hand if needed.  We did update her vitamin D level which was borderline low at 28.2 and we have increased her vitamin D to 2000 units daily.  Currently she is sitting in her room comfortably does not have any acute complaints although she does state she has some itching at times and would like something for that-.      Past Medical History:  Diagnosis Date  . Actinic keratosis   . BCC (basal cell carcinoma of skin)   . Closed fracture of second metatarsal bone of right foot   . Closed fracture of third metatarsal bone of right foot with routine healing   . IBS (irritable bowel syndrome)   . Multiple sclerosis ([place])   . Neurogenic bladder   .  Rectal polyp    Tubular adenoma  . Recurrent UTI   . SCC (squamous cell carcinoma)    Past Surgical History:  Procedure Laterality Date  . BOTOX INJECTION    . BREAST BIOPSY    . COLONOSCOPY    . SKIN CANCER EXCISION      No Known Allergies  Allergies as of 12/20/2019   No Known Allergies     Medication List       Accurate as of Dec 20, 2019  1:14 PM. If you have any questions, ask your nurse or doctor.        STOP taking these medications   Ensure Stopped by: Granville Lewis, PA-C     TAKE  these medications   acetaminophen 325 MG tablet Commonly known as: TYLENOL Take 325 mg by mouth every 4 (four) hours as needed for mild pain, fever or headache.   amLODipine 5 MG tablet Commonly known as: NORVASC Take 5 mg by mouth daily.   baclofen 20 MG tablet Commonly known as: LIORESAL Take 20 mg by mouth 3 (three) times daily.   baclofen 10 MG tablet Commonly known as: LIORESAL Take 30 mg by mouth at bedtime. Take 3 tablets to = 30 mg   dicyclomine 20 MG tablet Commonly known as: BENTYL Take 20 mg by mouth in the morning, at noon, in the evening, and at bedtime.   FLUoxetine 20 MG capsule Commonly known as: PROZAC Take 20 mg by mouth daily.   LORazepam 0.5 MG tablet Commonly known as: ATIVAN Take 1 tablet (0.5 mg total) by mouth daily as needed for anxiety (as needed for 14 days).   metoprolol succinate 25 MG 24 hr tablet Commonly known as: TOPROL-XL Take 25 mg by mouth daily.   NON FORMULARY Diet order: Regular NAS Diet.   Ocrevus 300 MG/10ML injection Generic drug: ocrelizumab Inject 600 mg into the vein every 6 (six) months.   ondansetron 4 MG tablet Commonly known as: ZOFRAN Take 4 mg by mouth every 6 (six) hours as needed for nausea or vomiting. Notify MD if symptoms persist.   pravastatin 40 MG tablet Commonly known as: PRAVACHOL Take 40 mg by mouth daily.   tamsulosin 0.4 MG Caps capsule Commonly known as: FLOMAX Take 0.4 mg by mouth daily.   tiZANidine 2 MG tablet Commonly known as: ZANAFLEX Take 1 mg by mouth at bedtime.   vancomycin 125 MG capsule Commonly known as: VANCOCIN Take 125 mg by mouth daily.   vitamin B-12 1000 MCG tablet Commonly known as: CYANOCOBALAMIN Take 1,000 mcg by mouth daily.   Vitamin D3 50 MCG (2000 UT) Chew Chew 1 tablet by mouth daily.       Review of Systems   General she is not complaining any fever or chills.  Skin does not complain of rashes but does complain of itching.  Head ears eyes nose mouth  and throat is not complaining of visual changes or sore throat.  Respiratory is not complain of being short of breath or having a cough.  Cardiac does not complain of chest pain-does have some lower extremity edema she feels this is more dependent related.  GI is not complaining of abdominal pain nausea vomiting diarrhea, patient at this point does have a history of C. difficile.  GU does not complain of dysuria currently.  Musculoskeletal does have a history of MS-does continue on muscle relaxers as noted above but is not really complaining of pain at this time.  Neurologic does not  complain of dizziness headache numbness does have a history of MS.  And psych does have a history of depression and occasional anxiety at this point appears to be controlled - does not complain of overt depression or being anxious currently  Immunization History  Administered Date(s) Administered  . Influenza, High Dose Seasonal PF 07/01/2018  . Influenza,inj,Quad PF,6+ Mos 05/20/2013, 05/26/2014, 06/18/2015, 06/18/2016, 06/29/2017, 06/20/2019  . Influenza-Unspecified 06/20/2019  . PPD Test 08/23/2019  . Td 08/18/1998  . Tdap 03/11/2012   Pertinent  Health Maintenance Due  Topic Date Due  . PAP SMEAR-Modifier  Never done  . MAMMOGRAM  Never done  . COLONOSCOPY  Never done  . INFLUENZA VACCINE  03/18/2020   No flowsheet data found. Functional Status Survey:    Vitals:   12/20/19 1156  BP: 106/60  Pulse: 74  Resp: 18  Temp: 98.1 F (36.7 C)  TempSrc: Oral  SpO2: 96%  Weight: 170 lb 1.6 oz (77.2 kg)  Height: 5\' 5"  (1.651 m)   Body mass index is 28.31 kg/m. Physical Exam In general this is a pleasant female in no distress.  Her skin is warm and dry cannot really appreciate any rashes.  Eyes visual acuity appears to be intact sclera and conjunctive are clear.  Oropharynx is clear mucous membranes are moist.  Chest is clear to auscultation there is no labored breathing.  Heart is  regular rate and rhythm without murmur gallop or rub she has some mild lower extremity edema this is cool to touch nonerythematous nontender.  Abdomen is somewhat obese soft nontender with positive bowel sounds.  Musculoskeletal does move all extremities x4 it appears at baseline.  Neurologic appears grossly intact cannot appreciate lateralizing findings cranial nerves appear to be intact her speech is clear  Psych she is alert and oriented pleasant and appropriate  Labs reviewed:  December 13, 2019.  WBC 8.6 hemoglobin 10.1 platelets 283.  Sodium 142 potassium 4.1 BUN 14.6 creatinine 0.71.  Vitamin D level was 28.2 Recent Labs    10/01/19 2151 10/10/19 0000 10/31/19 0000 10/31/19 0000 11/03/19 0000 11/10/19 0000 11/21/19 0000  NA 143   < > 141   < > 142 140 144  K 3.4*   < > 3.3*   < > 4.0 4.3 4.0  CL 106   < > 99   < > 101 101 105  CO2 25   < > 28*   < > 28* 27* 27*  GLUCOSE 116*  --   --   --   --   --   --   BUN 10   < > 14   < > 10 14 13   CREATININE 0.88   < > 0.7   < > 0.7 0.6 0.7  CALCIUM 9.4   < > 9.1  --  9.0 8.9  --    < > = values in this interval not displayed.   Recent Labs    10/01/19 2151 10/02/19 0000 10/31/19 0000  AST 18 14 7*  ALT 15 10 6*  ALKPHOS 52 46 52  BILITOT 0.7  --   --   PROT 7.1  --   --   ALBUMIN 3.7 3.6 3.4*   Recent Labs    10/01/19 2151 10/10/19 0000 11/03/19 0000 11/10/19 0000 11/21/19 0000  WBC 21.6*   < > 23.4 14.6 8.1  NEUTROABS 18.9*   < > 20 12 6   HGB 11.7*   < > 12.1 10.7* 11.0*  HCT  37.8   < > 38 33* 34*  MCV 86.3  --   --   --   --   PLT 335   < > 335 430* 450*   < > = values in this interval not displayed.   No results found for: TSH No results found for: HGBA1C No results found for: CHOL, HDL, LDLCALC, LDLDIRECT, TRIG, CHOLHDL  Significant Diagnostic Results in last 30 days:  No results found.  Assessment/Plan  #1 history of C. difficile recurrent-this appears to be stabilized she is receiving an  extended course of vancomycin and will be on this it appears through May 9-it is being titrated down gradually this appears to be stable will recheck a BMP to make sure electrolytes remain stable.  2.  History of UTI with sepsis she has completed antibiotic for this this appears to be stable she denies dysuria.  3.  History of vitamin D deficiency this is been increased to 1000 units daily-with update vitamin D level pending.  4.  History of multiple sclerosis continues with orders for- ocrelizumab 600 mg injected every 6 months   also has baclofen baclofen 20 mg which she will receive with breakfast lunch and dinner and at at bedtime-20 mg during the day and 30 mg at at bedtime.-Also has Zanaflex at at bedtime but she receives this separately from the baclofen.  5.  History of irritable bowel syndrome she is on Bentyl 20 mg 4 times a day this appears stable.  6.  History of depression-she is on Prozac 20 mg a day-she does have orders for as needed Ativan once a day if needed she says she does not need this often but feels it is very important she has this at least when she does need needed.  7.  History of hyperlipidemia not stated as uncontrolled she is on pravastatin 40 mg a day.  8.  History of hypertension she continues on Norvasc 5 mg a day as well as Toprol-XL 25 mg a day as noted above she appears to be stable in this regards.  9.  Itching-I have discussed this with nursing and they will start hydrocortisone as needed and monitor.  [id number]

## 2019-12-21 LAB — COMPREHENSIVE METABOLIC PANEL
Calcium: 9 (ref 8.7–10.7)
GFR calc Af Amer: >90
GFR calc non Af Amer: 82.12

## 2019-12-21 LAB — CBC: RBC: 3.91 (ref 3.87–5.11)

## 2019-12-21 LAB — CBC AND DIFFERENTIAL
HCT: 32 — AB (ref 36–46)
Hemoglobin: 10.2 — AB (ref 12.0–16.0)
Platelets: 267 (ref 150–399)
WBC: 8.4

## 2019-12-21 LAB — BASIC METABOLIC PANEL
BUN: 15 (ref 4–21)
CO2: 21 (ref 13–22)
Chloride: 108 (ref 99–108)
Creatinine: 0.8 (ref 0.5–1.1)
Glucose: 97
Potassium: 3.6 (ref 3.4–5.3)
Sodium: 145 (ref 137–147)

## 2019-12-23 ENCOUNTER — Encounter: Payer: Self-pay | Admitting: Internal Medicine

## 2020-01-04 ENCOUNTER — Non-Acute Institutional Stay (SKILLED_NURSING_FACILITY): Payer: Medicare Other | Admitting: Internal Medicine

## 2020-01-04 ENCOUNTER — Encounter: Payer: Self-pay | Admitting: Internal Medicine

## 2020-01-04 DIAGNOSIS — Z8619 Personal history of other infectious and parasitic diseases: Secondary | ICD-10-CM | POA: Diagnosis not present

## 2020-01-04 DIAGNOSIS — Z8659 Personal history of other mental and behavioral disorders: Secondary | ICD-10-CM | POA: Diagnosis not present

## 2020-01-04 DIAGNOSIS — R197 Diarrhea, unspecified: Secondary | ICD-10-CM

## 2020-01-04 DIAGNOSIS — Z87440 Personal history of urinary (tract) infections: Secondary | ICD-10-CM

## 2020-01-04 NOTE — Progress Notes (Signed)
Location:    [facility] Room Number: 210/W Place of Service:  SNF (31) Provider:  Granville Lewis  Patient, No Pcp Per  Patient Care Team: Patient, No Pcp Per as PCP - General (General Practice)  Extended Emergency Contact Information Primary Emergency Contact: [name] Mobile Phone: [phone] Relation: Other Secondary Emergency Contact: [name] Mobile Phone: [phone] Relation: Other  Code Status:  Full Code Goals of care: Advanced Directive information Advanced Directives 01/04/2020  Does Patient Have a Medical Advance Directive? Yes  Type of Advance Directive (No Data)  Does patient want to make changes to medical advance directive? No - Patient declined    Chief complaint acute visit secondary to follow-up of urine analysis-as well as anxiety she has   HPI:  Pt is a 64 y.o. female seen today for an acute visit for follow-up of her recent urinalysis.--  Patient is here for rehab with a history of recurrent C. difficile-just completed a prolonged course of vancomycin--as well as recent hospitalization for sepsis with UTI-she also has a history of hypertension-multiple sclerosis-irritable bowel syndrome as well as depression hyperlipidemia vitamin D deficiency and anxiety.  She describes her anxiety episodes as sporadic-and appears there is really no need for routine Ativan.  However she says when she does have a anxiety attack it is pretty significant and she needs her Ativan-she apparently has received this 2 consecutive days-provider did okay this on a limited as needed basis and on-call provider apparently most recently gave the okay to give a one-time dose.  Regards to her urinalysis this looks fairly benign with no specific organism she is not really complaining of dysuria today.  Regards to C. difficile she says she has had well formed stool but states she did have one episode of diarrhea apparently this morning.  With  her history of C. difficile will obtain another culture.  She is not complaining of any fever chills abdominal pain.  Vital signs appear to be stable.         Past Medical History:  Diagnosis Date  . Actinic keratosis   . BCC (basal cell carcinoma of skin)   . Closed fracture of second metatarsal bone of right foot   . Closed fracture of third metatarsal bone of right foot with routine healing   . IBS (irritable bowel syndrome)   . Multiple sclerosis ([place])   . Neurogenic bladder   . Rectal polyp    Tubular adenoma  . Recurrent UTI   . SCC (squamous cell carcinoma)    Past Surgical History:  Procedure Laterality Date  . BOTOX INJECTION    . BREAST BIOPSY    . COLONOSCOPY    . SKIN CANCER EXCISION      No Known Allergies  Outpatient Encounter Medications as of 01/04/2020  Medication Sig  . acetaminophen (TYLENOL) 325 MG tablet Take 325 mg by mouth every 4 (four) hours as needed for mild pain, fever or headache.   Marland Kitchen amLODipine (NORVASC) 5 MG tablet Take 5 mg by mouth daily.  . baclofen (LIORESAL) 10 MG tablet Take 30 mg by mouth at bedtime. Take 3 tablets to = 30 mg  . baclofen (LIORESAL) 20 MG tablet Take 20 mg by mouth 3 (three) times daily.  . Cholecalciferol (VITAMIN D3) 50 MCG (2000 UT) CHEW Chew 1 tablet by mouth daily.  Marland Kitchen dicyclomine (BENTYL) 20 MG tablet Take 20 mg by mouth in the morning, at noon, in the evening, and at bedtime.   Marland Kitchen  FLUoxetine (PROZAC) 20 MG capsule Take 20 mg by mouth daily.  . hydrocortisone cream 1 % Apply 1 application topically every 12 (twelve) hours as needed for itching.  . metoprolol succinate (TOPROL-XL) 25 MG 24 hr tablet Take 25 mg by mouth daily.  . NON FORMULARY Diet order: Regular NAS Diet.  Marland Kitchen ocrelizumab (OCREVUS) 300 MG/10ML injection Inject 600 mg into the vein every 6 (six) months.  . ondansetron (ZOFRAN) 4 MG tablet Take 4 mg by mouth every 6 (six) hours as needed for nausea or vomiting. Notify MD if symptoms persist.  .  pravastatin (PRAVACHOL) 40 MG tablet Take 40 mg by mouth daily.  . tamsulosin (FLOMAX) 0.4 MG CAPS capsule Take 0.4 mg by mouth daily.  Marland Kitchen tiZANidine (ZANAFLEX) 2 MG tablet Take 1 mg by mouth at bedtime.   . vitamin B-12 (CYANOCOBALAMIN) 1000 MCG tablet Take 1,000 mcg by mouth daily.  . [DISCONTINUED] LORazepam (ATIVAN) 0.5 MG tablet Take 1 tablet (0.5 mg total) by mouth daily as needed for anxiety (as needed for 14 days).   No facility-administered encounter medications on file as of 01/04/2020.    Review of Systems   In general she not complain of any fever or chills.  Skin does not complain of rashes itching or diaphoresis.  Head ears eyes nose mouth and throat does not complain of visual changes sore throat.  Respiratory is not complaining of shortness of breath or cough.  Cardiac does not complain of chest pain or increasing edema from baseline.  GU does not complain of any dysuria.  GI does not complain of any abdominal pain nausea vomiting or constipation-said she did have an episode of diarrhea earlier today.  Musculoskeletal at this point does not complain of joint pain.  Neurologic complain of dizziness headache numbness does have weakness with a history of multiple sclerosis.  And psych does not complain currently being depressed or anxious appears to be in good spirits.    Immunization History  Administered Date(s) Administered  . Influenza, High Dose Seasonal PF 07/01/2018  . Influenza,inj,Quad PF,6+ Mos 05/20/2013, 05/26/2014, 06/18/2015, 06/18/2016, 06/29/2017, 06/20/2019  . Influenza-Unspecified 06/20/2019  . PPD Test 08/23/2019  . Td 08/18/1998  . Tdap 03/11/2012   Pertinent  Health Maintenance Due  Topic Date Due  . PAP SMEAR-Modifier  Never done  . MAMMOGRAM  Never done  . COLONOSCOPY  Never done  . INFLUENZA VACCINE  03/18/2020   No flowsheet data found. Functional Status Survey:      Physical Exam   Temperature is 98.5 pulse 78 respiration  17 blood pressure 134/71.  In general this is a pleasant middle-age female in no distress lying comfortably in bed.  Her skin is warm and dry.  Eyes visual acuity appears to be intact sclera and conjunctive are clear.  Oropharynx is clear mucous membranes moist.  Chest is clear to auscultation without labored breathing.  Heart is regular rate and rhythm without murmur gallop or rub she has mild lower extremity edema which appears to be baseline for her.  Abdomen is somewhat protuberant soft nontender with active bowel sounds.  Musculoskeletal Limited exam since she is in bed but is able to move all extremities x4 it appears at relative baseline with lower extremity weakness at baseline.  Neurologic as noted above her speech is clear could not really appreciate lateralizing findings.  Psych she is alert and oriented pleasant and appropriate.  Labs.  Dec 21, 2019.  WBC 8.4 hemoglobin 10.2 platelets 267.  Sodium  145 potassium 3.6 BUN 15.2 creatinine 0.77.  December 13, 2019.  WBC 8.6 hemoglobin 10.1 platelets 283.  Sodium 142 potassium 4.1 BUN 14.6 creatinine 0.71.  Vitamin D level was 28.2 Recent Labs (within last 365 days)           Recent Labs    10/01/19 2151 10/10/19 0000 10/31/19 0000 10/31/19 0000 11/03/19 0000 11/10/19 0000 11/21/19 0000  NA 143   < > 141   < > 142 140 144  K 3.4*   < > 3.3*   < > 4.0 4.3 4.0  CL 106   < > 99   < > 101 101 105  CO2 25   < > 28*   < > 28* 27* 27*  GLUCOSE 116*  --   --   --   --   --   --   BUN 10   < > 14   < > 10 14 13   CREATININE 0.88   < > 0.7   < > 0.7 0.6 0.7  CALCIUM 9.4   < > 9.1  --  9.0 8.9  --    < > = values in this interval not displayed.     Recent Labs (within last 365 days)       Recent Labs    10/01/19 2151 10/02/19 0000 10/31/19 0000  AST 18 14 7*  ALT 15 10 6*  ALKPHOS 52 46 52  BILITOT 0.7  --   --   PROT 7.1  --   --   ALBUMIN 3.7 3.6 3.4*     Recent Labs (within last 365 days)          Recent Labs    10/01/19 2151 10/10/19 0000 11/03/19 0000 11/10/19 0000 11/21/19 0000  WBC 21.6*   < > 23.4 14.6 8.1  NEUTROABS 18.9*   < > 20 12 6   HGB 11.7*   < > 12.1 10.7* 11.0*  HCT 37.8   < > 38 33* 34*  MCV 86.3  --   --   --   --   PLT 335   < > 335 430* 450*   < > = values in this interval not displayed.     Recent Labs     Labs reviewed: Recent Labs    10/01/19 2151 10/10/19 0000 10/31/19 0000 10/31/19 0000 11/03/19 0000 11/10/19 0000 11/21/19 0000  NA 143   < > 141   < > 142 140 144  K 3.4*   < > 3.3*   < > 4.0 4.3 4.0  CL 106   < > 99   < > 101 101 105  CO2 25   < > 28*   < > 28* 27* 27*  GLUCOSE 116*  --   --   --   --   --   --   BUN 10   < > 14   < > 10 14 13   CREATININE 0.88   < > 0.7   < > 0.7 0.6 0.7  CALCIUM 9.4   < > 9.1  --  9.0 8.9  --    < > = values in this interval not displayed.   Recent Labs    10/01/19 2151 10/02/19 0000 10/31/19 0000  AST 18 14 7*  ALT 15 10 6*  ALKPHOS 52 46 52  BILITOT 0.7  --   --   PROT 7.1  --   --   ALBUMIN 3.7 3.6  3.4*   Recent Labs    10/01/19 2151 10/10/19 0000 11/03/19 0000 11/10/19 0000 11/21/19 0000  WBC 21.6*   < > 23.4 14.6 8.1  NEUTROABS 18.9*   < > 20 12 6   HGB 11.7*   < > 12.1 10.7* 11.0*  HCT 37.8   < > 38 33* 34*  MCV 86.3  --   --   --   --   PLT 335   < > 335 430* 450*   < > = values in this interval not displayed.   No results found for: TSH No results found for: HGBA1C No results found for: CHOL, HDL, LDLCALC, LDLDIRECT, TRIG, CHOLHDL  Significant Diagnostic Results in last 30 days:  No results found.  Assessment/Plan  #1 history of UTI-recent urinalysis does not really show any specific organism she is not really complaining of dysuria today-she is afebrile does not appear to be overtly symptomatic at this time.  2.  History of C. difficile-she does report an episode of diarrhea earlier today with her history of C. difficile will obtain a C. difficile culture.  Also  will update lab work including a CBC with differential to look for any elevated white count as well as a metabolic panel to keep an eye on her electrolytes.  3.  History of anxiety as noted above this appears to be very sporadic and unpredictable-she has had a couple recent doses here provided by provider apparently on a spot basis-it appears this is probably the best way to approach this-there is not really any need here it appears for routine Ativan.  [id number]

## 2020-01-05 LAB — COMPREHENSIVE METABOLIC PANEL
Calcium: 8.9 (ref 8.7–10.7)
GFR calc Af Amer: >90
GFR calc non Af Amer: 82.09

## 2020-01-05 LAB — CBC AND DIFFERENTIAL
HCT: 30 — AB (ref 36–46)
Hemoglobin: 9.7 — AB (ref 12.0–16.0)
Platelets: 299 (ref 150–399)
WBC: 8.4

## 2020-01-05 LAB — BASIC METABOLIC PANEL
BUN: 11 (ref 4–21)
CO2: 24 — AB (ref 13–22)
Chloride: 111 — AB (ref 99–108)
Creatinine: 0.8 (ref 0.5–1.1)
Glucose: 90
Potassium: 3.1 — AB (ref 3.4–5.3)
Sodium: 147 (ref 137–147)

## 2020-01-05 LAB — CBC: RBC: 3.73 — AB (ref 3.87–5.11)

## 2020-01-07 ENCOUNTER — Encounter: Payer: Self-pay | Admitting: Internal Medicine

## 2020-01-07 ENCOUNTER — Telehealth: Payer: Self-pay | Admitting: Internal Medicine

## 2020-01-07 NOTE — Telephone Encounter (Signed)
Nurse called about Renee Huang started Diarrhea again since Wed. Not sure how many episodes. But no abdominal Pain or distension. No Fever. Vitals Stable Stool positive for C Diff. Has h/o Recurent C Diff Start on Vanco 500mg  QID for 14 days. Will need Long taper again. Possible ? Stool transplant.Will let Dr Mariea Clonts make that decision. Patient is in Isolation again.

## 2020-01-09 ENCOUNTER — Non-Acute Institutional Stay (SKILLED_NURSING_FACILITY): Payer: Medicare Other | Admitting: Internal Medicine

## 2020-01-09 DIAGNOSIS — E876 Hypokalemia: Secondary | ICD-10-CM | POA: Diagnosis not present

## 2020-01-09 DIAGNOSIS — A0471 Enterocolitis due to Clostridium difficile, recurrent: Secondary | ICD-10-CM

## 2020-01-09 DIAGNOSIS — E559 Vitamin D deficiency, unspecified: Secondary | ICD-10-CM | POA: Diagnosis not present

## 2020-01-09 LAB — CBC AND DIFFERENTIAL
HCT: 31 — AB (ref 36–46)
Hemoglobin: 10.2 — AB (ref 12.0–16.0)
Platelets: 338 (ref 150–399)
WBC: 10.5

## 2020-01-09 LAB — HEPATIC FUNCTION PANEL
ALT: 10 (ref 7–35)
AST: 12 — AB (ref 13–35)
Alkaline Phosphatase: 58 (ref 25–125)
Bilirubin, Total: 0.2

## 2020-01-09 LAB — COMPREHENSIVE METABOLIC PANEL
Albumin: 3.1 — AB (ref 3.5–5.0)
Calcium: 9.1 (ref 8.7–10.7)
GFR calc Af Amer: >90
GFR calc non Af Amer: >90
Globulin: 2

## 2020-01-09 LAB — BASIC METABOLIC PANEL
BUN: 10 (ref 4–21)
CO2: 23 — AB (ref 13–22)
Chloride: 111 — AB (ref 99–108)
Creatinine: 0.7 (ref 0.5–1.1)
Glucose: 79
Potassium: 4.2 (ref 3.4–5.3)
Sodium: 147 (ref 137–147)

## 2020-01-09 LAB — VITAMIN D 25 HYDROXY (VIT D DEFICIENCY, FRACTURES): Vit D, 25-Hydroxy: 19.4

## 2020-01-09 LAB — CBC: RBC: 3.9 (ref 3.87–5.11)

## 2020-01-10 ENCOUNTER — Encounter: Payer: Self-pay | Admitting: Internal Medicine

## 2020-01-10 NOTE — Progress Notes (Signed)
Patient ID: Renee Huang, female   DOB: 02/02/1956, 63 y.o.   MRN: [id number]  Location:      Place of Service:    Provider:  Elgie Maziarz L. Mariea Huang, D.O., C.M.D.  Patient, No Pcp Per  Patient Care Team: Patient, No Pcp Per as PCP - General (General Practice)  Extended Emergency Contact Information Primary Emergency Contact: Renee Huang Mobile Phone: [phone] Relation: Other Secondary Emergency Contact: Renee Huang Mobile Phone: [phone] Relation: Other   Goals of care: Advanced Directive information Advanced Directives 01/10/2020  Does Patient Have a Medical Advance Directive? Yes  Type of Advance Directive (No Data)  Does patient want to make changes to medical advance directive? No - Patient declined     Chief Complaint  Patient presents with  . Acute Visit    C diff colitis, generalized itching and depression     HPI:  Pt is a 64 y.o. female seen today for an acute visit for C diff colitis, generalized itching and depression.  Per nursing, pt only recently completed a vanc taper per Dr. Sheppard Coil.  She is chronically colonized with c diff.  Her stool was tested after a single loose stool and came back positive so on call did order oral vanc for 14 days and isolation.  Pt reports more loose stool than nursing reports she truly has.  Labs were ordered and showed mild hypernatremia and fluids were encouraged.  She did not have leukocytosis thought wbc borderline at 10.5.  She has low albumin.  She also has vitamin D deficiency and Renee Huang has increased her D3 to 6000units daily.    She has chronic depression and reports generalized itching.     Past Medical History:  Diagnosis Date  . Actinic keratosis   . BCC (basal cell carcinoma of skin)   . Closed fracture of second metatarsal bone of right foot   . Closed fracture of third metatarsal bone of right foot with routine healing   . IBS (irritable bowel syndrome)   . Multiple sclerosis ([place])   . Neurogenic bladder    . Rectal polyp    Tubular adenoma  . Recurrent UTI   . SCC (squamous cell carcinoma)    Past Surgical History:  Procedure Laterality Date  . BOTOX INJECTION    . BREAST BIOPSY    . COLONOSCOPY    . SKIN Huang EXCISION      No Known Allergies  Outpatient Encounter Medications as of 01/10/2020  Medication Sig  . acetaminophen (TYLENOL) 325 MG tablet Take 325 mg by mouth every 4 (four) hours as needed for mild pain, fever or headache.   Marland Kitchen amLODipine (NORVASC) 5 MG tablet Take 5 mg by mouth daily.  . baclofen (LIORESAL) 10 MG tablet Take 30 mg by mouth at bedtime. Take 3 tablets to = 30 mg  . baclofen (LIORESAL) 20 MG tablet Take 20 mg by mouth 3 (three) times daily.  . Cholecalciferol (VITAMIN D3) 50 MCG (2000 UT) CHEW Chew 1 tablet by mouth daily.  Marland Kitchen dicyclomine (BENTYL) 20 MG tablet Take 20 mg by mouth in the morning, at noon, in the evening, and at bedtime.   Marland Kitchen FLUoxetine (PROZAC) 20 MG capsule Take 20 mg by mouth daily.  . hydrocortisone cream 1 % Apply 1 application topically every 12 (twelve) hours as needed for itching.  . metoprolol succinate (TOPROL-XL) 25 MG 24 hr tablet Take 25 mg by mouth daily.  . NON FORMULARY Diet order: Regular NAS Diet.  Marland Kitchen ocrelizumab (OCREVUS)  300 MG/10ML injection Inject 600 mg into the vein every 6 (six) months.  . ondansetron (ZOFRAN) 4 MG tablet Take 4 mg by mouth every 6 (six) hours as needed for nausea or vomiting. Notify MD if symptoms persist.  . potassium chloride (KLOR-CON) 10 MEQ tablet Take 20 mEq by mouth daily.  . pravastatin (PRAVACHOL) 40 MG tablet Take 40 mg by mouth daily.  . tamsulosin (FLOMAX) 0.4 MG CAPS capsule Take 0.4 mg by mouth daily.  Marland Kitchen tiZANidine (ZANAFLEX) 2 MG tablet Take 1 mg by mouth at bedtime.   . vitamin B-12 (CYANOCOBALAMIN) 1000 MCG tablet Take 1,000 mcg by mouth daily.   No facility-administered encounter medications on file as of 01/10/2020.    ROS  Immunization History  Administered Date(s) Administered   . Influenza, High Dose Seasonal PF 07/01/2018  . Influenza,inj,Quad PF,6+ Mos 05/20/2013, 05/26/2014, 06/18/2015, 06/18/2016, 06/29/2017, 06/20/2019  . Influenza-Unspecified 06/20/2019  . PPD Test 08/23/2019  . Td 08/18/1998  . Tdap 03/11/2012   Pertinent  Health Maintenance Due  Topic Date Due  . PAP SMEAR-Modifier  Never done  . MAMMOGRAM  Never done  . COLONOSCOPY  Never done  . INFLUENZA VACCINE  03/18/2020   No flowsheet data found. Functional Status Survey:    There were no vitals filed for this visit. There is no height or weight on file to calculate BMI. Physical Exam  Labs reviewed: Recent Labs    10/01/19 2151 10/10/19 0000 10/31/19 0000 10/31/19 0000 11/03/19 0000 11/10/19 0000 11/21/19 0000  NA 143   < > 141   < > 142 140 144  K 3.4*   < > 3.3*   < > 4.0 4.3 4.0  CL 106   < > 99   < > 101 101 105  CO2 25   < > 28*   < > 28* 27* 27*  GLUCOSE 116*  --   --   --   --   --   --   BUN 10   < > 14   < > 10 14 13   CREATININE 0.88   < > 0.7   < > 0.7 0.6 0.7  CALCIUM 9.4   < > 9.1  --  9.0 8.9  --    < > = values in this interval not displayed.   Recent Labs    10/01/19 2151 10/02/19 0000 10/31/19 0000  AST 18 14 7*  ALT 15 10 6*  ALKPHOS 52 46 52  BILITOT 0.7  --   --   PROT 7.1  --   --   ALBUMIN 3.7 3.6 3.4*   Recent Labs    10/01/19 2151 10/10/19 0000 11/03/19 0000 11/10/19 0000 11/21/19 0000  WBC 21.6*   < > 23.4 14.6 8.1  NEUTROABS 18.9*   < > 20 12 6   HGB 11.7*   < > 12.1 10.7* 11.0*  HCT 37.8   < > 38 33* 34*  MCV 86.3  --   --   --   --   PLT 335   < > 335 430* 450*   < > = values in this interval not displayed.   No results found for: TSH No results found for: HGBA1C No results found for: CHOL, HDL, LDLCALC, LDLDIRECT, TRIG, CHOLHDL  Significant Diagnostic Results in last 30 days:  No results found.  Assessment/Plan There are no diagnoses linked to this encounter.   Family/ staff  Lyndall Bellot L. Analissa Bayless,  D.O. [place]  [place], [place] [address] Cell Phone (Mon-Fri 8am-5pm):  [phone] On Call:  [phone] & follow prompts after 5pm & weekends Office Phone:  [phone] Office Fax:  [phone]  This encounter was created in error - please disregard.

## 2020-01-10 NOTE — Progress Notes (Signed)
Location:    [place] [facility] Room Number: 210/W Place of Service:  SNF (31) Provider:  Granville Lewis  Patient, No Pcp Per  Patient Care Team: Patient, No Pcp Per as PCP - General (General Practice)  Extended Emergency Contact Information Primary Emergency Contact: [name] Mobile Phone: [phone] Relation: Other Secondary Emergency Contact: [name] Mobile Phone: [phone] Relation: Other  Code Status:  Full Code Goals of care: Advanced Directive information Advanced Directives 01/10/2020  Does Patient Have a Medical Advance Directive? Yes  Type of Advance Directive (No Data)  Does patient want to make changes to medical advance directive? No - Patient declined   Chief complaint-acute visit secondary to recurrent C. difficile as well as follow-up hypokalemia    HPI:  Pt is a 64 y.o. female seen today for an acute visit for recurrent C. difficile.  Patient has received an extended course of vancomycin previously for recurrent C. difficile.  However she recently complained of having diarrhea again-Per staff this did not appear to be as persistent as previous bouts--however culture was done which has come back positive for C. difficile.  She does not really complain of any abdominal discomfort or fever-her white count is not elevated at this time.  She has been from vancomycin 500 mg 4 times daily for 14 days  However recent lab did show hypokalemia with a potassium of 3.1 we have supplemented this and it is now up to 4.2.  Of note she also has a diagnosis of irritable bowel syndrome and continues on Bentyl l 20 mg 4 times daily  Currently she is lying in bed comfortably-she appears somewhat irritatedtoday saying she has some itching which appears to be generalized-topical treatment she is receiving apparently is not helping much per her report  Vital signs appear to be stable.  Of note recent lab did show vitamin D deficiency  with a level of 19.4 she is on vitamin D 2000 units a day-will increase this.       Past Medical History:  Diagnosis Date  . Actinic keratosis   . BCC (basal cell carcinoma of skin)   . Closed fracture of second metatarsal bone of right foot   . Closed fracture of third metatarsal bone of right foot with routine healing   . IBS (irritable bowel syndrome)   . Multiple sclerosis ([place])   . Neurogenic bladder   . Rectal polyp    Tubular adenoma  . Recurrent UTI   . SCC (squamous cell carcinoma)    Past Surgical History:  Procedure Laterality Date  . BOTOX INJECTION    . BREAST BIOPSY    . COLONOSCOPY    . SKIN CANCER EXCISION      No Known Allergies  Outpatient Encounter Medications as of 01/09/2020  Medication Sig  . acetaminophen (TYLENOL) 325 MG tablet Take 325 mg by mouth every 4 (four) hours as needed for mild pain, fever or headache.   Marland Kitchen amLODipine (NORVASC) 5 MG tablet Take 5 mg by mouth daily.  . baclofen (LIORESAL) 10 MG tablet Take 30 mg by mouth at bedtime. Take 3 tablets to = 30 mg  . baclofen (LIORESAL) 20 MG tablet Take 20 mg by mouth 3 (three) times daily.  . Cholecalciferol (VITAMIN D3) 50 MCG (2000 UT) CHEW Chew 1 tablet by mouth daily.  Marland Kitchen dicyclomine (BENTYL) 20 MG tablet Take 20 mg by mouth in the morning, at noon, in the evening, and at bedtime.   Marland Kitchen  FLUoxetine (PROZAC) 20 MG capsule Take 20 mg by mouth daily.  . hydrocortisone cream 1 % Apply 1 application topically every 12 (twelve) hours as needed for itching.  . metoprolol succinate (TOPROL-XL) 25 MG 24 hr tablet Take 25 mg by mouth daily.  . NON FORMULARY Diet order: Regular NAS Diet.  Marland Kitchen ocrelizumab (OCREVUS) 300 MG/10ML injection Inject 600 mg into the vein every 6 (six) months.  . ondansetron (ZOFRAN) 4 MG tablet Take 4 mg by mouth every 6 (six) hours as needed for nausea or vomiting. Notify MD if symptoms persist.  . potassium chloride (KLOR-CON) 10 MEQ tablet Take 20 mEq by mouth daily.  .  pravastatin (PRAVACHOL) 40 MG tablet Take 40 mg by mouth daily.  . tamsulosin (FLOMAX) 0.4 MG CAPS capsule Take 0.4 mg by mouth daily.  Marland Kitchen tiZANidine (ZANAFLEX) 2 MG tablet Take 1 mg by mouth at bedtime.   . vitamin B-12 (CYANOCOBALAMIN) 1000 MCG tablet Take 1,000 mcg by mouth daily.   No facility-administered encounter medications on file as of 01/09/2020.    Review of Systems   In general she not complaining of any fever or chills.  Skin does complain of itching-  Head ears eyes nose mouth and throat is not complain of visual changes sore throat.  Respiratory does not complain of being short of breath or having a cough.  Cardiac does not complain of chest pain has baseline lower extremity edema.  GI does states she still has some diarrhea-at times gas-per staff stool appears at times to be more semisolid.  GU is not complaining of dysuria.  Musculoskeletal does have weakness with history of MS is not complaining of acute pain at this time.  Neurologic positive for MS does not complain of dizziness or headache or syncope.  And psych does have a history of depression and occasional anxiety.    Immunization History  Administered Date(s) Administered  . Influenza, High Dose Seasonal PF 07/01/2018  . Influenza,inj,Quad PF,6+ Mos 05/20/2013, 05/26/2014, 06/18/2015, 06/18/2016, 06/29/2017, 06/20/2019  . Influenza-Unspecified 06/20/2019  . PPD Test 08/23/2019  . Td 08/18/1998  . Tdap 03/11/2012   Pertinent  Health Maintenance Due  Topic Date Due  . PAP SMEAR-Modifier  Never done  . MAMMOGRAM  Never done  . COLONOSCOPY  Never done  . INFLUENZA VACCINE  03/18/2020   No flowsheet data found. Functional Status Survey:    Vitals:   01/10/20 0910  BP: 118/68  Pulse: 68  Resp: 18  Temp: 98.4 F (36.9 C)  TempSrc: Oral  SpO2: 96%  Weight: 165 lb 4.8 oz (75 kg)  Height: 5\' 5"  (1.651 m)   Body mass index is 27.51 kg/m. Physical Exam General this is a well-nourished  elderly female in no distress.  Her skin is warm and dry-on her back possibly arash but this does not appear to be real dramatic-question heat?.  Eyes visual acuity appears to be intact sclera and conjunctive are clear.  Oropharynx clear mucous membranes moist.  Chest is clear to auscultation there is no labored breathing.  Heart is regular rate and rhythm without murmur gallop or rub she has baseline lower extremity edema.  Abdomen is soft nontender with positive bowel sounds.  Musculoskeletal is able to move all extremities x4 but has lower extremity weakness.  Neurologic as noted above she is alert speech is clear cranial nerves appear to be intact.  Psych she is alert and oriented --somewhat irritated today.  And at times a bit tearful-concerned because apparently  her cats are with somebody else now that she has been in a nursing home-she states she really misses her cats   Labs reviewed: Recent Labs    10/01/19 2151 10/10/19 0000 12/21/19 0000 01/05/20 0000 01/09/20 0000  NA 143   < > 145 147 147  K 3.4*   < > 3.6 3.1* 4.2  CL 106   < > 108 111* 111*  CO2 25   < > 21 24* 23*  GLUCOSE 116*  --   --   --   --   BUN 10   < > 15 11 10   CREATININE 0.88   < > 0.8 0.8 0.7  CALCIUM 9.4   < > 9.0 8.9 9.1   < > = values in this interval not displayed.   Recent Labs    10/01/19 2151 10/01/19 2151 10/02/19 0000 10/31/19 0000 01/09/20 0000  AST 18   < > 14 7* 12*  ALT 15   < > 10 6* 10  ALKPHOS 52   < > 46 52 58  BILITOT 0.7  --   --   --   --   PROT 7.1  --   --   --   --   ALBUMIN 3.7   < > 3.6 3.4* 3.1*   < > = values in this interval not displayed.   Recent Labs    10/01/19 2151 10/10/19 0000 11/03/19 0000 11/03/19 0000 11/10/19 0000 11/10/19 0000 11/21/19 0000 12/13/19 0000 12/21/19 0000 01/05/20 0000 01/09/20 0000  WBC 21.6*   < > 23.4   < > 14.6   < > 8.1   < > 8.4 8.4 10.5  NEUTROABS 18.9*   < > 20  --  12  --  6  --   --   --   --   HGB 11.7*   <  > 12.1   < > 10.7*   < > 11.0*   < > 10.2* 9.7* 10.2*  HCT 37.8   < > 38   < > 33*   < > 34*   < > 32* 30* 31*  MCV 86.3  --   --   --   --   --   --   --   --   --   --   PLT 335   < > 335   < > 430*   < > 450*   < > 267 299 338   < > = values in this interval not displayed.   No results found for: TSH No results found for: HGBA1C No results found for: CHOL, HDL, LDLCALC, LDLDIRECT, TRIG, CHOLHDL  Significant Diagnostic Results in last 30 days:  No results found.  Assessment/Plan   #1 recurrent C. difficile-she has been started on vancomycin 500 mg 3 times daily for 14 days-this was started by the on-call provider who was called about this weekend.  At this point will continue to monitor she is not really complaining of abdominal pain or profuse diarrhea at this time.  2.  Hypokalemia this has normalized with potassium supplementation at this point continue 20 mEq of potassium and will recheck a metabolic panel on Thursday, May 27.  3.  History of vitamin D deficiency vitamin D level appears to have dropped despite being on 2000 units of vitamin D a day-will increase this to 6000 units a day and recheck a vitamin D level in 1 month.  4.  History of borderline  hypernatremia with a sodium of 147 -we have been encouraging fluids-I did discuss this with her again today she states she will try-states that time she does not feel she has enough fluids at bedside this will have to be encouraged.  5.  Itching-could not really appreciate overt rashes today she has received hydrocortisone cream but apparently this is not helping much-I will discuss this with the wound care nurse-as well as with Dr. Matilde Haymaker will try to follow-up tomorrow   [phone].

## 2020-01-12 ENCOUNTER — Non-Acute Institutional Stay (SKILLED_NURSING_FACILITY): Payer: Medicare Other | Admitting: Internal Medicine

## 2020-01-12 ENCOUNTER — Encounter: Payer: Self-pay | Admitting: Internal Medicine

## 2020-01-12 DIAGNOSIS — L299 Pruritus, unspecified: Secondary | ICD-10-CM | POA: Diagnosis not present

## 2020-01-12 DIAGNOSIS — E876 Hypokalemia: Secondary | ICD-10-CM | POA: Diagnosis not present

## 2020-01-12 DIAGNOSIS — A0472 Enterocolitis due to Clostridium difficile, not specified as recurrent: Secondary | ICD-10-CM

## 2020-01-12 DIAGNOSIS — E870 Hyperosmolality and hypernatremia: Secondary | ICD-10-CM

## 2020-01-12 LAB — BASIC METABOLIC PANEL
BUN: 19 (ref 4–21)
CO2: 23 — AB (ref 13–22)
Chloride: 107 (ref 99–108)
Creatinine: 0.8 (ref 0.5–1.1)
Glucose: 77
Potassium: 4.5 (ref 3.4–5.3)
Sodium: 145 (ref 137–147)

## 2020-01-12 LAB — VITAMIN D 25 HYDROXY (VIT D DEFICIENCY, FRACTURES): Vit D, 25-Hydroxy: 19.6

## 2020-01-12 LAB — COMPREHENSIVE METABOLIC PANEL: Calcium: 9 (ref 8.7–10.7)

## 2020-01-12 NOTE — Progress Notes (Signed)
Location:    [place] Room Number: [place] of Service:  SNF (31) Provider:  Granville Lewis  Patient, No Pcp Per  Patient Care Team: Patient, No Pcp Per as PCP - General (General Practice)  Extended Emergency Contact Information Primary Emergency Contact: [name] Mobile Phone: [phone] Relation: Other Secondary Emergency Contact: [name] Mobile Phone: [phone] Relation: Other  Code Status:  Full Code Goals of care: Advanced Directive information Advanced Directives 01/10/2020  Does Patient Have a Medical Advance Directive? Yes  Type of Advance Directive -  Does patient want to make changes to medical advance directive? No - Patient declined     Chief Complaint  Patient presents with  . Follow-up    Follow-up on itching, cdiff, and hypokalemia     HPI:  Pt is a 64 y.o. female seen today for an acute visit for for follow-up of C. difficile as well as hypokalemia and itching.  Patient was seen earlier this week-she does have a history of recurrent C. difficile and has been started vancomycin 500 mg 4 times daily through June 6-apparently the diarrhea has improved-.  She is not really complaining of abdominal pain or nausea.  She was noted to have a potassium of 3.1 earlier we have supplemented this and this appears to have normalized with a potassium level 4.5 on today's lab.  She also has complained of generalized itching she has been receiving topical hydrocortisone but she says this is not effective.  She does not appear to have significant rashes-have also had wound care take a look as well.  Currently vital signs appear to be stable she continues to complain of itching-.  She also has situational anxiety but does not want or need routine Ativan but has received it on a spot basis-      Past Medical History:  Diagnosis Date  . Actinic keratosis   . BCC (basal cell carcinoma of skin)   . Closed fracture  of second metatarsal bone of right foot   . Closed fracture of third metatarsal bone of right foot with routine healing   . IBS (irritable bowel syndrome)   . Multiple sclerosis ([place])   . Neurogenic bladder   . Rectal polyp    Tubular adenoma  . Recurrent UTI   . SCC (squamous cell carcinoma)    Past Surgical History:  Procedure Laterality Date  . BOTOX INJECTION    . BREAST BIOPSY    . COLONOSCOPY    . SKIN CANCER EXCISION      No Known Allergies  Outpatient Encounter Medications as of 01/12/2020  Medication Sig  . acetaminophen (TYLENOL) 325 MG tablet Take 325 mg by mouth every 4 (four) hours as needed for mild pain, fever or headache.   Marland Kitchen amLODipine (NORVASC) 5 MG tablet Take 5 mg by mouth daily.  . baclofen (LIORESAL) 10 MG tablet Take 30 mg by mouth at bedtime. Take 3 tablets to = 30 mg  . baclofen (LIORESAL) 20 MG tablet Take 20 mg by mouth 3 (three) times daily.  . Cholecalciferol (VITAMIN D3) 50 MCG (2000 UT) CHEW Chew 6 tablets by mouth daily. (6000 units)  . dicyclomine (BENTYL) 20 MG tablet Take 20 mg by mouth in the morning, at noon, in the evening, and at bedtime.   Marland Kitchen FLUoxetine (PROZAC) 20 MG capsule Take 20 mg by mouth daily.  . hydrocortisone cream 1 % Apply 1 application topically every 12 (twelve) hours as needed  for itching.  . metoprolol succinate (TOPROL-XL) 25 MG 24 hr tablet Take 25 mg by mouth daily.  . NON FORMULARY Diet order: Regular NAS Diet.  Marland Kitchen ocrelizumab (OCREVUS) 300 MG/10ML injection Inject 600 mg into the vein every 6 (six) months.  . ondansetron (ZOFRAN) 4 MG tablet Take 4 mg by mouth every 6 (six) hours as needed for nausea or vomiting. Notify MD if symptoms persist.  . potassium chloride (KLOR-CON) 10 MEQ tablet Take 20 mEq by mouth daily.  . pravastatin (PRAVACHOL) 40 MG tablet Take 40 mg by mouth daily.  . tamsulosin (FLOMAX) 0.4 MG CAPS capsule Take 0.4 mg by mouth daily.  Marland Kitchen tiZANidine (ZANAFLEX) 2 MG tablet Take 1 mg by mouth at bedtime.    . vancomycin (VANCOCIN) 250 MG capsule Take 250 mg by mouth 4 (four) times daily.  . vitamin B-12 (CYANOCOBALAMIN) 1000 MCG tablet Take 1,000 mcg by mouth daily.   No facility-administered encounter medications on file as of 01/12/2020.    Review of Systems   General she not complain of any fever or chills.  Skin continues to complain of generalized itching as noted above.  Head ears eyes nose mouth and throat is not complain of visual changes or sore throat.  Respiratory does not complain of being short of breath or having a cough.  Cardiac is not complaining of chest pain or increasing edema from her baseline edema.  GI does not complain of abdominal pain has recurrent C. difficile-history on diarrhea is somewhat sketchy but appears to have improved.  GU is not complaining of dysuria.  Musculoskeletal does have a history of MS with lower extremity weakness does not complain of pain currently.  Neurologic is positive for MS does not complain of dizziness syncope or headache  Immunization History  Administered Date(s) Administered  . Influenza, High Dose Seasonal PF 07/01/2018  . Influenza,inj,Quad PF,6+ Mos 05/20/2013, 05/26/2014, 06/18/2015, 06/18/2016, 06/29/2017, 06/20/2019  . Influenza-Unspecified 06/20/2019  . PPD Test 08/23/2019  . Td 08/18/1998  . Tdap 03/11/2012   Pertinent  Health Maintenance Due  Topic Date Due  . PAP SMEAR-Modifier  Never done  . MAMMOGRAM  Never done  . COLONOSCOPY  Never done  . INFLUENZA VACCINE  03/18/2020   No flowsheet data found. Functional Status Survey:    Vitals:   01/12/20 1555  BP: 120/71  Pulse: 72  Resp: 18  Temp: 98 F (36.7 C)  TempSrc: Oral  SpO2: 95%  Weight: 165 lb 4.8 oz (75 kg)  Height: 5\' 5"  (1.651 m)   Body mass index is 27.51 kg/m. Physical Exam   In general this is a well-nourished female in no distress  Her skin is warm and dry cannot really appreciate rashes.  Eyes visual acuity appears to be  intact sclera and conjunctive are clear.  Oropharynx is clear mucous membranes moist.  Chest is clear to auscultation there is no labored breathing.  Heart is regular rate and rhythm without murmur gallop or rub she continues with her baseline lower extremity edema.  Abdomen is soft does not really appear to be tender there are positive bowel sounds.  Musculoskeletal does move all extremities x4 with lower extremity weakness-limited exam since she is in bed.  Neurologic as noted above she is alert her speech is clear cranial nerves remain intact.  Psych she is alert and oriented-continues to be irritated at times frustrated with the itching.    Labs reviewed: Recent Labs    10/01/19 2151 10/10/19 0000 01/05/20  0000 01/09/20 0000 01/12/20 0000  NA 143   < > 147 147 145  K 3.4*   < > 3.1* 4.2 4.5  CL 106   < > 111* 111* 107  CO2 25   < > 24* 23* 23*  GLUCOSE 116*  --   --   --   --   BUN 10   < > 11 10 19   CREATININE 0.88   < > 0.8 0.7 0.8  CALCIUM 9.4   < > 8.9 9.1 9.0   < > = values in this interval not displayed.   Recent Labs    10/01/19 2151 10/01/19 2151 10/02/19 0000 10/31/19 0000 01/09/20 0000  AST 18   < > 14 7* 12*  ALT 15   < > 10 6* 10  ALKPHOS 52   < > 46 52 58  BILITOT 0.7  --   --   --   --   PROT 7.1  --   --   --   --   ALBUMIN 3.7   < > 3.6 3.4* 3.1*   < > = values in this interval not displayed.   Recent Labs    10/01/19 2151 10/10/19 0000 11/03/19 0000 11/03/19 0000 11/10/19 0000 11/10/19 0000 11/21/19 0000 12/13/19 0000 12/21/19 0000 01/05/20 0000 01/09/20 0000  WBC 21.6*   < > 23.4   < > 14.6   < > 8.1   < > 8.4 8.4 10.5  NEUTROABS 18.9*   < > 20  --  12  --  6  --   --   --   --   HGB 11.7*   < > 12.1   < > 10.7*   < > 11.0*   < > 10.2* 9.7* 10.2*  HCT 37.8   < > 38   < > 33*   < > 34*   < > 32* 30* 31*  MCV 86.3  --   --   --   --   --   --   --   --   --   --   PLT 335   < > 335   < > 430*   < > 450*   < > 267 299 338   < >  = values in this interval not displayed.   No results found for: TSH No results found for: HGBA1C No results found for: CHOL, HDL, LDLCALC, LDLDIRECT, TRIG, CHOLHDL  Significant Diagnostic Results in last 30 days:  No results found.  Assessment/Plan #1 history of C. difficile-recurrent she is on vancomycin 500 mg 4 times daily through June 6-apparently this is improving somewhat.  She is not complaining of any abdominal pain nausea or vomiting.  At this point continue to monitor.  We will update a CBC next week to keep an eye on her white count-at times she has had a significantly elevated white count for C. Difficile---- but white count actually on most recent lab was only 10.5  2.  History of hypokalemia this appears to be resolved with potassium of 4.5 on today's lab-- she is currently on 20 mEq a day will update a metabolic panel next week to ensure stability.  3.  Generalized itching this continues to be a challenge will start Atarax 25 mg every 8 hours as needed for itching and see if this will be of some benefit.--Could not really appreciate any rashes and wound care has looked at this is well  4.  History of mild hypernatremia-recent sodiums were 147-we have encourage fluids-sodium is 145 on today's lab which shows improvement will have this updated next week as well    [id number]

## 2020-01-21 LAB — BASIC METABOLIC PANEL
BUN: 7 (ref 4–21)
CO2: 23 — AB (ref 13–22)
Chloride: 111 — AB (ref 99–108)
Glucose: 102
Potassium: 3.4 (ref 3.4–5.3)
Sodium: 142 (ref 137–147)

## 2020-01-21 LAB — CBC AND DIFFERENTIAL
HCT: 28 — AB (ref 36–46)
Platelets: 288 (ref 150–399)
WBC: 7.4

## 2020-01-21 LAB — COMPREHENSIVE METABOLIC PANEL: Calcium: 8 — AB (ref 8.7–10.7)

## 2020-01-21 LAB — CBC: RBC: 9.2 — AB (ref 3.87–5.11)

## 2020-01-23 ENCOUNTER — Non-Acute Institutional Stay (SKILLED_NURSING_FACILITY): Payer: Medicare Other | Admitting: Internal Medicine

## 2020-01-23 DIAGNOSIS — F329 Major depressive disorder, single episode, unspecified: Secondary | ICD-10-CM | POA: Diagnosis not present

## 2020-01-23 DIAGNOSIS — G35 Multiple sclerosis: Secondary | ICD-10-CM

## 2020-01-23 DIAGNOSIS — N390 Urinary tract infection, site not specified: Secondary | ICD-10-CM

## 2020-01-23 DIAGNOSIS — K580 Irritable bowel syndrome with diarrhea: Secondary | ICD-10-CM

## 2020-01-23 DIAGNOSIS — A0471 Enterocolitis due to Clostridium difficile, recurrent: Secondary | ICD-10-CM

## 2020-01-23 DIAGNOSIS — A419 Sepsis, unspecified organism: Secondary | ICD-10-CM | POA: Diagnosis not present

## 2020-01-23 DIAGNOSIS — F32A Depression, unspecified: Secondary | ICD-10-CM

## 2020-01-23 MED ORDER — BACLOFEN 10 MG PO TABS
20.00 | ORAL_TABLET | ORAL | Status: DC
Start: 2020-01-22 — End: 2020-01-23

## 2020-01-23 MED ORDER — TAMSULOSIN HCL 0.4 MG PO CAPS
0.40 | ORAL_CAPSULE | ORAL | Status: DC
Start: 2020-01-23 — End: 2020-01-23

## 2020-01-23 MED ORDER — BACLOFEN 10 MG PO TABS
30.00 | ORAL_TABLET | ORAL | Status: DC
Start: 2020-01-22 — End: 2020-01-23

## 2020-01-23 MED ORDER — ACETAMINOPHEN 325 MG PO TABS
650.00 | ORAL_TABLET | ORAL | Status: DC
Start: ? — End: 2020-01-23

## 2020-01-23 MED ORDER — TIZANIDINE HCL 4 MG PO TABS
2.00 | ORAL_TABLET | ORAL | Status: DC
Start: ? — End: 2020-01-23

## 2020-01-23 MED ORDER — FLUOXETINE HCL 20 MG PO CAPS
20.00 | ORAL_CAPSULE | ORAL | Status: DC
Start: 2020-01-23 — End: 2020-01-23

## 2020-01-23 MED ORDER — HEPARIN SODIUM (PORCINE) 5000 UNIT/ML IJ SOLN
5000.00 | INTRAMUSCULAR | Status: DC
Start: 2020-01-22 — End: 2020-01-23

## 2020-01-23 MED ORDER — METOPROLOL SUCCINATE ER 25 MG PO TB24
25.00 | ORAL_TABLET | ORAL | Status: DC
Start: 2020-01-23 — End: 2020-01-23

## 2020-01-23 MED ORDER — RA PROBIOTIC DIGESTIVE CARE PO CAPS
1.00 | ORAL_CAPSULE | ORAL | Status: DC
Start: 2020-01-23 — End: 2020-01-23

## 2020-01-23 MED ORDER — PRAVASTATIN SODIUM 40 MG PO TABS
40.00 | ORAL_TABLET | ORAL | Status: DC
Start: 2020-01-22 — End: 2020-01-23

## 2020-01-23 MED ORDER — VANCOMYCIN HCL 50 MG/ML PO SOLR
125.00 | ORAL | Status: DC
Start: 2020-01-22 — End: 2020-01-23

## 2020-01-23 MED ORDER — AMLODIPINE BESYLATE 5 MG PO TABS
5.00 | ORAL_TABLET | ORAL | Status: DC
Start: 2020-01-23 — End: 2020-01-23

## 2020-01-23 MED ORDER — CEFDINIR 300 MG PO CAPS
300.00 | ORAL_CAPSULE | ORAL | Status: DC
Start: 2020-01-22 — End: 2020-01-23

## 2020-01-23 MED ORDER — DICYCLOMINE HCL 20 MG PO TABS
20.00 | ORAL_TABLET | ORAL | Status: DC
Start: 2020-01-22 — End: 2020-01-23

## 2020-01-23 MED ORDER — HYDROXYZINE HCL 25 MG PO TABS
25.00 | ORAL_TABLET | ORAL | Status: DC
Start: ? — End: 2020-01-23

## 2020-01-23 MED ORDER — ONDANSETRON HCL 4 MG/2ML IJ SOLN
4.00 | INTRAMUSCULAR | Status: DC
Start: ? — End: 2020-01-23

## 2020-01-23 NOTE — Progress Notes (Signed)
This is an acute visit.  Level care skilled.  [facility]  Chief complaint acute visit follow-up hospitalization for sepsis-UTI.  History of present illness.  Patient is a 64 year old female seen today for follow-up from hospitalization for sepsis thought secondary to UTI.  Patient presented to the hospital after being found to be febrile and tachycardic at the facility.  .  Apparently she had some vomiting episodes as well.  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  Hospital evaluation showed sepsis secondary to urinary tract infection-she was initially hypotensive and blood pressure medication was held-she was treated with IV Rocephin and her fever and leukocytosis resolved.  The culture did grow out Proteus mirabilis.  She is completing a course of Omnicef.  In regards to C. difficile she is on oral vancomycin and a probiotic.  Apparently the diarrhea has improved.  Again because of hypotension antihypertensives were held she is no longer on Metroprolol but this will have to be watched.  She also had been on Norvasc.  Marland Kitchen  Her other medical issues including neurogenic bladder hyperlipidemia multiple sclerosis appear to be stable during her hospitalization.  She does have a history of depression as well she is on Prozac-she continues to be somewhat tearful-frustrated that she appears to be having the recurrent C. difficile and then UTIs-.  Vital signs appear to be stable she is afebrile.  Past Medical History:  Diagnosis Date   Actinic keratosis    BCC (basal cell carcinoma of skin)    Closed fracture of second metatarsal bone of right foot    Closed fracture of third metatarsal bone of right foot with routine healing    IBS (irritable bowel syndrome)    Multiple sclerosis (HCC)    Neurogenic bladder    Rectal polyp    Tubular adenoma   Recurrent UTI    SCC (squamous cell carcinoma)          Past Surgical History:  Procedure Laterality Date   BOTOX INJECTION     BREAST BIOPSY     COLONOSCOPY     SKIN CANCER EXCISION      Social History        Socioeconomic History   Marital status: Divorced    Spouse name: Not on file   Number of children: Not on file   Years of education: Not on file   Highest education level: Not on file  Occupational History   Not on file  Tobacco Use   Smoking status: Never Smoker   Smokeless tobacco: Never Used  Substance and Sexual Activity   Alcohol use: Yes    Alcohol/week: 18.0 standard drinks    Types: 2 Glasses of wine, 16 Standard drinks or equivalent per week   Drug use: Never   Sexual activity: Not on file  Other Topics Concern   Not on file  Social History Narrative   Divorced.  No children. Member of [employer] and has a good support system there. Never smoker. 16.7 standard drinks - two 5 oz glasses of wine per week.  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    Social Determinants of Health      Financial Resource Strain:    Difficulty of Paying Living Expenses:   Food Insecurity:    Worried About Charity fundraiser in the Last Year:    Arboriculturist in  the Last Year:   Transportation Needs:    Film/video editor (Medical):    Lack of Transportation (Non-Medical):   Physical Activity:    Days of Exercise per Week:    Minutes of Exercise per Session:   Stress:    Feeling of Stress :   Social Connections:    Frequency of Communication with Friends and Family:    Frequency of Social Gatherings with Friends and Family:    Attends Religious Services:    Active Member of Clubs or Organizations:    Attends Archivist Meetings:    Marital Status:     reports that she has never smoked. She has never used smokeless tobacco. She reports current alcohol use of about  18.0 standard drinks of alcohol per week. She reports that she does not use drugs.  Functional Status Survey:  Family History  Problem Relation Age of Onset   Hypertension Mother    Hyperlipidemia Mother    Breast cancer Mother    Hypertension Father    Heart attack Father    Hypertension Brother    Diabetes Brother    Lung cancer Maternal Grandmother    Heart attack Paternal Grandfather         Health Maintenance  Topic Date Due   Hepatitis C Screening  Never done   COVID-19 Vaccine (1) Never done   HIV Screening  Never done   PAP SMEAR-Modifier  Never done   MAMMOGRAM  Never done   COLONOSCOPY  Never done   INFLUENZA VACCINE  03/18/2020   TETANUS/TDAP  03/11/2022    No Known Allergies       Medications.  Omnicef 300 mg twice daily x5 days through 01/27/2020.  Vancomycin 250 mg 4 times a day x10 days through 02/01/2020.  Tylenol 650 mg every 4 hours as needed.  Baclofen 30 mg nightly.  Baclofen 20 mg 3 times daily.  Vitamin D 6000 units daily.  B12 1000 mcg daily.  Bentyl 20 mg 4 times daily.  Prozac 20 mg daily.  Hydrocortisone cream twice daily as needed.  Zofran 4 mg every 6 hours as needed nausea.  Potassium 20 mEq daily.  Pravachol 40 mg daily.  Flomax 0.4 mg daily.  Atarax 25 mg 3 times daily as needed itching.  Lactobacillus daily.  Zanaflex 1 mg nightly.  Review of systems.  General she is not complaining of any fever or chills.  Skin does not complain of rashes or diaphoresis-does have a history of itching.  Respiratory is not complaining of being short of breath or having a cough.  Cardiac does not complain of chest pain has chronic lower extremity edema.  GI is not really complaining of abdominal pain diarrhea apparently is improving does not complain of nausea or vomiting.  GU is not complaining of dysuria.  Musculoskeletal does have a history of MS with lower extremity weakness does not  really complain of pain today.  Neurologic again history of MS does not complain of headache or dizziness or syncope.  And psych does have a history of depression and occasional anxiety is somewhat tearful frustrated with her situation with the C. difficile and the UTIs.  Marland Kitchen  Physical exam.  Temperature is 97.4 pulse 65-when she gets emotionally upset it can  rise  to slightly above 100 -respirations 20 blood pressure 122/68  In general this is a well-nourished female in no distress she is somewhat tearful.  Her skin is warm and dry.  Eyes visual acuity  appears to be intact sclera and conjunctive are clear.  Oropharynx clear mucous membranes moist.  Chest is clear to auscultation cannot appreciate any labored breathing.  Heart is regular rhythm-at times tachycardic when she gets tearful-upset-.  She has chronic lower extremity edema at baseline.  Abdomen is soft obese nontender with positive bowel sounds.  Musculoskeletal is able to move all extremities x4 with lower extremity weakness moves upper extremities at baseline.  Neurologic appears grossly intact her speech is clear cranial nerves appear to be intact.  Psych she is alert and oriented tearful    Labs.  Hospital labs showed a white count of 16.8 hemoglobin 10.0 platelets 288.  Sodium 137 potassium 4.1 BUN 15 creatinine 0.94-.  Albumin was 3.4  01/17/2020.  WBC 9.1 hemoglobin 10.4 platelets 302.  Sodium 143 potassium 4.7 BUN 18.1 creatinine 0.71.  Marland Kitchen Recent Labs    10/01/19 2151 10/10/19 0000 01/05/20 0000 01/09/20 0000 01/12/20 0000  NA 143   < > 147 147 145  K 3.4*   < > 3.1* 4.2 4.5  CL 106   < > 111* 111* 107  CO2 25   < > 24* 23* 23*  GLUCOSE 116*  --   --   --   --   BUN 10   < > 11 10 19   CREATININE 0.88   < > 0.8 0.7 0.8  CALCIUM 9.4   < > 8.9 9.1 9.0   < > = values in this interval not displayed.     Liver Function Tests: Recent Labs (within last 365 days)         Recent Labs     10/01/19 2151 10/01/19 2151 10/02/19 0000 10/31/19 0000 01/09/20 0000  AST 18   < > 14 7* 12*  ALT 15   < > 10 6* 10  ALKPHOS 52   < > 46 52 58  BILITOT 0.7  --   --   --   --   PROT 7.1  --   --   --   --   ALBUMIN 3.7   < > 3.6 3.4* 3.1*   < > = values in this interval not displayed.     Recent Labs (within last 365 days)     Recent Labs    10/01/19 2151  LIPASE 38     Recent Labs (within last 365 days)  No results for input(s): AMMONIA in the last 8760 hours.   CBC: Recent Labs (within last 365 days)               Recent Labs    10/01/19 2151 10/10/19 0000 11/03/19 0000 11/03/19 0000 11/10/19 0000 11/10/19 0000 11/21/19 0000 12/13/19 0000 12/21/19 0000 01/05/20 0000 01/09/20 0000  WBC 21.6*   < > 23.4   < > 14.6   < > 8.1   < > 8.4 8.4 10.5  NEUTROABS 18.9*   < > 20  --  12  --  6  --   --   --   --   HGB 11.7*   < > 12.1   < > 10.7*   < > 11.0*   < > 10.2* 9.7* 10.2*  HCT 37.8   < > 38   < > 33*   < > 34*   < > 32* 30* 31*  MCV 86.3  --   --   --   --   --   --   --   --   --   --  PLT 335   < > 335   < > 430*   < > 450*   < > 267 299 338   < > = values in this interval not displayed.       Assessment and plan.   #1 history of sepsis-with hypotension-fever-thought secondary to Proteus mirabilis UTI.  She is completed a course of IV Rocephin and is completing a course of Omnicef here in the facility for 5 more days.  Currently she is afebrile is not complaining of dysuria.  Will update a CBC with differential as well as metabolic panel.  2.  History of C. difficile colitis recurrent-she is on vancomycin 250 mg 4 times a day for 10 additional days at this point.  She is also on a probiotic.  At this point will monitor apparently diarrhea has improved.  3.  Hypertension again her Norvasc and metoprolol were held secondary to low blood pressures in the hospital.  At this point will monitor blood pressures appear to be stable she does get  somewhat tachycardic when she gets upset and emotional-at this point will monitor but may need her metoprolol restarted if blood pressure can tolerate it and tachycardia becomes persistent.  4.-History of multiple sclerosis she continues on baclofen 20 mg 3 times daily and 30 mg nightly.  She also has orders for Zanaflex 1 mg nightly--she will have also has orders for Ocrelizumab injections every 6 months  5.  History of irritable bowel syndrome she is on Bentyl 20 mg 4 times daily at this point will monitor.  6.  History of depression-she is on Prozac 20 mg a day-she feels she has not been getting it since she has arrived at the facility but it is on her med list and I have spoken with staff to ensure she receives this  At this point will monitor--consider psychiatric consult-but will await Dr. Cyndi Lennert input when she sees her tomorrow.  She continues to be tearful about her situation.  7.  History of itching she does have orders for hydrocortisone as well as Atarax 25 mg 3 times daily as needed itching she is not really complaining of itching today but this has been somewhat of a persistent complaint previously.  8.  History of hyperlipidemia she is on Pravachol 40 mg a day not stated as uncontrolled.  9.  History of hypokalemia she is on low-dose potassium it appears potassium is normalized but this will warrant recheck.  10.  History of vitamin D deficiency she is on supplementation 6000 units daily.--Continue to monitor Vitamin D level was 19.4 and actually dropping on 2000 units daily update vitamin D level is pending.  11.  History of neurogenic bladder with history of multiple sclerosis she does continue on Flomax 0.4 mg daily.  #12 anxiety at times she does have situational anxiety and has responded to lorazepam at this point will monitor  Again will update a CBC and metabolic panel-also will need monitoring of her pulses and blood pressure-I have spoken with staff about  this.  [id number] note greater than 40 minutes spent assessing patient-reviewing her chart and labs-and coordinating and formulating a plan of care for numerous diagnoses-of note greater than 50% of time spent coordinating a plan of care with input as noted above

## 2020-01-24 ENCOUNTER — Non-Acute Institutional Stay (SKILLED_NURSING_FACILITY): Payer: Medicare Other | Admitting: Internal Medicine

## 2020-01-24 ENCOUNTER — Encounter: Payer: Self-pay | Admitting: Internal Medicine

## 2020-01-24 ENCOUNTER — Encounter: Payer: Self-pay | Admitting: General Practice

## 2020-01-24 DIAGNOSIS — A419 Sepsis, unspecified organism: Secondary | ICD-10-CM

## 2020-01-24 DIAGNOSIS — F322 Major depressive disorder, single episode, severe without psychotic features: Secondary | ICD-10-CM | POA: Diagnosis not present

## 2020-01-24 DIAGNOSIS — A0471 Enterocolitis due to Clostridium difficile, recurrent: Secondary | ICD-10-CM

## 2020-01-24 DIAGNOSIS — N390 Urinary tract infection, site not specified: Secondary | ICD-10-CM

## 2020-01-24 DIAGNOSIS — F410 Panic disorder [episodic paroxysmal anxiety] without agoraphobia: Secondary | ICD-10-CM

## 2020-01-24 DIAGNOSIS — G35 Multiple sclerosis: Secondary | ICD-10-CM

## 2020-01-24 DIAGNOSIS — G35D Multiple sclerosis, unspecified: Secondary | ICD-10-CM

## 2020-01-24 DIAGNOSIS — N319 Neuromuscular dysfunction of bladder, unspecified: Secondary | ICD-10-CM

## 2020-01-24 LAB — COMPREHENSIVE METABOLIC PANEL
Calcium: 9.5 (ref 8.7–10.7)
GFR calc Af Amer: >90
GFR calc non Af Amer: 78.36

## 2020-01-24 LAB — CBC AND DIFFERENTIAL
HCT: 31 — AB (ref 36–46)
Hemoglobin: 9.8 — AB (ref 12.0–16.0)
Platelets: 412 — AB (ref 150–399)
WBC: 6.6

## 2020-01-24 LAB — BASIC METABOLIC PANEL
BUN: 7 (ref 4–21)
CO2: 21 (ref 13–22)
Chloride: 108 (ref 99–108)
Creatinine: 0.8 (ref 0.5–1.1)
Glucose: 73
Potassium: 3.8 (ref 3.4–5.3)
Sodium: 144 (ref 137–147)

## 2020-01-24 LAB — CBC: RBC: 3.81 — AB (ref 3.87–5.11)

## 2020-01-24 MED ORDER — LORAZEPAM 0.5 MG PO TABS: 0.5000 mg | ORAL_TABLET | Freq: Two times a day (BID) | ORAL | 0 refills | Status: AC | PRN

## 2020-01-24 NOTE — Progress Notes (Signed)
Provider:  Rexene Edison. Mariea Clonts, D.O., C.M.D. Location:  [facility] Room Number: [place] of Service:  SNF ([phone])  PCP: Francesca Oman, DO Patient Care Team: Francesca Oman, DO as PCP - General (Internal Medicine) Rehab, [place] ([place])  Extended Emergency Contact Information Primary Emergency Contact: [name] Mobile Phone: [phone] Relation: Other Secondary Emergency Contact: [name] Mobile Phone: [phone] Relation: Other  Code Status: full code Goals of Care: Advanced Directive information Advanced Directives 01/24/2020  Does Patient Have a Medical Advance Directive? Yes  Type of Advance Directive Out of facility DNR (pink MOST or yellow form)  Does patient want to make changes to medical advance directive? No - Patient declined  Pre-existing out of facility DNR order (yellow form or pink MOST form) Pink MOST/Yellow Form most recent copy in chart - Physician notified to receive inpatient order   Chief Complaint  Patient presents with  . Readmit To SNF    Re-admit to SNF    HPI: Patient is a 64 y.o. female with MS and neurogenic bladder with chronic foley seen today for readmission to [facility] s/p hospitalization for sepsis secondary to UTI.  She'd been sent out to the hospital due to fever, tachycardia and vomiting.  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  She was diagnosed with sepsis secondary to Proteus mirabilis UTI.  BP meds were held.  She was given IV rocephin and her fever and leukocytosis resolved.  She was then transitioned to omnicef oral abx.  She is to remain on the oral vanc until 5 days (6/16) after she finishes the oral omnicef for UTI (6/11).    She was overtly depressed when seen, but her antidepressant was just resumed.  She is coping with the reality that she is in need of long-term care due to her  MS and paraplegia.  She is dependent in adls and frustrated about her loss of independence. She'd apparently been very active and working prior to end of last year when her MS progressed and then she began a cycle of UTIs and c diff.  She talks about being disappointed about having to spend her money on care for herself when she could be helping family members or giving to some other cause.  She does not want to live in her current state.  She has fecal incontinence in the bed.  She's had at least two episodes of incontinence today, but does not have sensory awareness each time so says it could be more.    Past Medical History:  Diagnosis Date  . Actinic keratosis   . BCC (basal cell carcinoma of skin)   . Closed fracture of second metatarsal bone of right foot   . Closed fracture of third metatarsal bone of right foot with routine healing   . IBS (irritable bowel syndrome)   . Multiple sclerosis ([place])   . Neurogenic bladder   . Rectal polyp    Tubular adenoma  . Recurrent UTI   . SCC (squamous cell carcinoma)    Past Surgical History:  Procedure Laterality Date  . BOTOX INJECTION    . BREAST BIOPSY    . COLONOSCOPY    . SKIN CANCER EXCISION      Social History   Socioeconomic History  . Marital status: Divorced    Spouse name: Not on file  . Number of children:  Not on file  . Years of education: Not on file  . Highest education level: Not on file  Occupational History  . Not on file  Tobacco Use  . Smoking status: Never Smoker  . Smokeless tobacco: Never Used  Substance and Sexual Activity  . Alcohol use: Yes    Alcohol/week: 18.0 standard drinks    Types: 2 Glasses of wine, 16 Standard drinks or equivalent per week  . Drug use: Never  . Sexual activity: Not on file  Other Topics Concern  . Not on file  Social History Narrative   Divorced.  No children. Member of [employer] and has a good support system there. Never smoker. 16.7 standard drinks - two 5 oz  glasses of wine per week.  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    Social Determinants of Health   Financial Resource Strain:   . Difficulty of Paying Living Expenses:   Food Insecurity:   . Worried About Charity fundraiser in the Last Year:   . Arboriculturist in the Last Year:   Transportation Needs:   . Film/video editor (Medical):   Marland Kitchen Lack of Transportation (Non-Medical):   Physical Activity:   . Days of Exercise per Week:   . Minutes of Exercise per Session:   Stress:   . Feeling of Stress :   Social Connections:   . Frequency of Communication with Friends and Family:   . Frequency of Social Gatherings with Friends and Family:   . Attends Religious Services:   . Active Member of Clubs or Organizations:   . Attends Archivist Meetings:   Marland Kitchen Marital Status:     reports that she has never smoked. She has never used smokeless tobacco. She reports current alcohol use of about 18.0 standard drinks of alcohol per week. She reports that she does not use drugs.  Functional Status Survey:    Family History  Problem Relation Age of Onset  . Hypertension Mother   . Hyperlipidemia Mother   . Breast cancer Mother   . Hypertension Father   . Heart attack Father   . Hypertension Brother   . Diabetes Brother   . Lung cancer Maternal Grandmother   . Heart attack Paternal Grandfather     Health Maintenance  Topic Date Due  . Hepatitis C Screening  Never done  . HIV Screening  Never done  . PAP SMEAR-Modifier  Never done  . MAMMOGRAM  Never done  . COLONOSCOPY  Never done  . INFLUENZA VACCINE  03/18/2020  . TETANUS/TDAP  03/11/2022  . COVID-19 Vaccine  Completed    No Known Allergies  Outpatient Encounter Medications as of 01/24/2020  Medication Sig  . acetaminophen (TYLENOL) 325 MG tablet Take 325 mg by mouth every 4 (four) hours as needed for mild pain, fever or headache.   Marland Kitchen  amLODipine (NORVASC) 5 MG tablet Take 5 mg by mouth daily.  . baclofen (LIORESAL) 10 MG tablet Take 10 mg by mouth 3 (three) times daily.  . Cholecalciferol (VITAMIN D3) 50 MCG (2000 UT) CHEW Chew 6 tablets by mouth daily. (6000 units)  . dicyclomine (BENTYL) 20 MG tablet Take 20 mg by mouth in the morning, at noon, in the evening, and at bedtime.   Marland Kitchen FLUoxetine (PROZAC) 20 MG capsule Take 20 mg by mouth daily.  . hydrocortisone cream 1 % Apply 1 application topically every  12 (twelve) hours as needed for itching.  Marland Kitchen LACTOBACILLUS RHAMNOSUS, GG, PO Take by mouth. 10 billion cell capsule. Take 1 capsule daily x 30 days  . NON FORMULARY Diet order: Regular NAS Diet.  Marland Kitchen ocrelizumab (OCREVUS) 300 MG/10ML injection Inject 600 mg into the vein every 6 (six) months.  . potassium chloride (KLOR-CON) 10 MEQ tablet Take 20 mEq by mouth daily.  . pravastatin (PRAVACHOL) 40 MG tablet Take 40 mg by mouth daily.  . tamsulosin (FLOMAX) 0.4 MG CAPS capsule Take 0.4 mg by mouth daily.  . vitamin B-12 (CYANOCOBALAMIN) 1000 MCG tablet Take 1,000 mcg by mouth daily.  . [DISCONTINUED] baclofen (LIORESAL) 10 MG tablet Take 30 mg by mouth at bedtime. Take 3 tablets to = 30 mg  . [DISCONTINUED] baclofen (LIORESAL) 20 MG tablet Take 20 mg by mouth 3 (three) times daily.  . [DISCONTINUED] LORazepam (ATIVAN) 0.5 MG tablet Take 0.5 mg by mouth every 8 (eight) hours. Take by mouth every 8 hours as needed for anxiety  . [DISCONTINUED] metoprolol succinate (TOPROL-XL) 25 MG 24 hr tablet Take 25 mg by mouth daily.  . [DISCONTINUED] ondansetron (ZOFRAN) 4 MG tablet Take 4 mg by mouth every 6 (six) hours as needed for nausea or vomiting. Notify MD if symptoms persist.  . [DISCONTINUED] tiZANidine (ZANAFLEX) 2 MG tablet Take 1 mg by mouth at bedtime.    No facility-administered encounter medications on file as of 01/24/2020.    Review of Systems  Constitutional: Negative for chills, fever and malaise/fatigue.  HENT: Negative  for congestion, hearing loss and sore throat.   Eyes: Negative for blurred vision.  Respiratory: Negative for cough and shortness of breath.   Cardiovascular: Negative for chest pain, palpitations and leg swelling.  Gastrointestinal: Positive for diarrhea. Negative for abdominal pain, blood in stool, constipation and melena.  Genitourinary: Negative for dysuria.       Foley in place with medium yellow urine  Musculoskeletal: Negative for falls and joint pain.  Skin: Negative for itching and rash.  Neurological: Positive for weakness. Negative for dizziness and loss of consciousness.  Psychiatric/Behavioral: Positive for depression. Negative for memory loss. The patient is nervous/anxious. The patient does not have insomnia.     Vitals:   01/24/20 1016  BP: 127/74  Pulse: 100  Temp: 97.8 F (36.6 C)  Height: 5\' 5"  (1.651 m)   Body mass index is 27.51 kg/m. Physical Exam Vitals reviewed.  Constitutional:      General: She is not in acute distress.    Appearance: Normal appearance. She is not toxic-appearing.  HENT:     Head: Normocephalic and atraumatic.     Right Ear: External ear normal.     Left Ear: External ear normal.     Nose: Nose normal.     Mouth/Throat:     Pharynx: Oropharynx is clear.  Eyes:     Extraocular Movements: Extraocular movements intact.     Conjunctiva/sclera: Conjunctivae normal.     Pupils: Pupils are equal, round, and reactive to light.  Cardiovascular:     Rate and Rhythm: Normal rate and regular rhythm.     Pulses: Normal pulses.     Heart sounds: Normal heart sounds.  Pulmonary:     Effort: Pulmonary effort is normal.     Breath sounds: Normal breath sounds. No wheezing, rhonchi or rales.  Abdominal:     General: There is no distension.     Palpations: Abdomen is soft.     Tenderness: There is  no abdominal tenderness. There is no guarding or rebound.     Comments: Hyperactive bowel sounds  Musculoskeletal:     Cervical back: Neck supple.      Right lower leg: No edema.     Left lower leg: No edema.     Comments: Uses manual wheelchair but admits to staying in bed much of the time  Lymphadenopathy:     Cervical: No cervical adenopathy.  Skin:    General: Skin is warm and dry.     Capillary Refill: Capillary refill takes less than 2 seconds.     Coloration: Skin is pale.  Neurological:     Mental Status: She is alert.     Motor: Weakness present.     Comments: paraplegia  Psychiatric:     Comments: Crying and very upset during visit, wound up asking for an ativan     Labs reviewed: Basic Metabolic Panel: Recent Labs    10/01/19 2151 10/10/19 0000 01/05/20 0000 01/05/20 0000 01/09/20 0000 01/12/20 0000 01/21/20 0000  NA 143   < > 147   < > 147 145 142  K 3.4*   < > 3.1*   < > 4.2 4.5 3.4  CL 106   < > 111*   < > 111* 107 111*  CO2 25   < > 24*   < > 23* 23* 23*  GLUCOSE 116*  --   --   --   --   --   --   BUN 10   < > 11   < > 10 19 7   CREATININE 0.88   < > 0.8  --  0.7 0.8  --   CALCIUM 9.4   < > 8.9   < > 9.1 9.0 8.0*   < > = values in this interval not displayed.   Liver Function Tests: Recent Labs    10/01/19 2151 10/01/19 2151 10/02/19 0000 10/31/19 0000 01/09/20 0000  AST 18   < > 14 7* 12*  ALT 15   < > 10 6* 10  ALKPHOS 52   < > 46 52 58  BILITOT 0.7  --   --   --   --   PROT 7.1  --   --   --   --   ALBUMIN 3.7   < > 3.6 3.4* 3.1*   < > = values in this interval not displayed.   Recent Labs    10/01/19 2151  LIPASE 38   No results for input(s): AMMONIA in the last 8760 hours. CBC: Recent Labs    10/01/19 2151 10/10/19 0000 11/03/19 0000 11/03/19 0000 11/10/19 0000 11/10/19 0000 11/21/19 0000 12/13/19 0000 12/21/19 0000 12/21/19 0000 01/05/20 0000 01/09/20 0000 01/21/20 0000  WBC 21.6*   < > 23.4   < > 14.6   < > 8.1   < > 8.4   < > 8.4 10.5 7.4  NEUTROABS 18.9*   < > 20  --  12  --  6  --   --   --   --   --   --   HGB 11.7*   < > 12.1   < > 10.7*   < > 11.0*   < >  10.2*  --  9.7* 10.2*  --   HCT 37.8   < > 38   < > 33*   < > 34*   < > 32*   < > 30* 31* 28*  MCV  86.3  --   --   --   --   --   --   --   --   --   --   --   --   PLT 335   < > 335   < > 430*   < > 450*   < > 267   < > 299 338 288   < > = values in this interval not displayed.   Cardiac Enzymes: No results for input(s): CKTOTAL, CKMB, CKMBINDEX, TROPONINI in the last 8760 hours. BNP: Invalid input(s): POCBNP No results found for: HGBA1C No results found for: TSH No results found for: VITAMINB12 No results found for: FOLATE No results found for: IRON, TIBC, FERRITIN  Imaging and Procedures obtained prior to SNF admission: DG Chest Port 1 View  Result Date: 10/01/2019 CLINICAL DATA:  Back pain, nausea and vomiting, weakness, hypoxia EXAM: PORTABLE CHEST 1 VIEW COMPARISON:  07/23/2019 FINDINGS: Single frontal view of the chest demonstrates a stable cardiac silhouette. No airspace disease, effusion, or pneumothorax. No acute bony abnormalities. IMPRESSION: 1. Stable exam, no acute process. Electronically Signed   By: Randa Ngo M.D.   On: 10/01/2019 22:24    Assessment/Plan 1. Sepsis secondary to UTI ([place]) -resolved, finishing off course of omnicef -encouraged hydration to help prevent infection and needs good pericare to prevent fecal contamination  2. Recurrent colitis due to Clostridium difficile -continue vanc thru 6/16 (5 days after finishes omnicef) -DO NOT RECHECK C DIFF TEST unless vital signs suggest recurrent infection b/c she is colonized  3. Multiple sclerosis ([place]) -she is paraplegic and requires SNF care at this point -not progressing with her bed mobility  4. Current severe episode of major depressive disorder without psychotic features without prior episode ([place]) - cont recently resumed prozac--hopefully this will help her cope with her sad circumstances - LORazepam (ATIVAN) 0.5 MG tablet; Take 1 tablet (0.5 mg total) by mouth 2 (two) times daily as needed for  up to 14 days for anxiety.  Dispense: 28 tablet; Refill: 0  5. Neurogenic bladder -cont foley changes q 28 days -hydrate and maintain good hygiene  6. Panic attacks - renew ativan--I witnessed one of her panic attacks today - LORazepam (ATIVAN) 0.5 MG tablet; Take 1 tablet (0.5 mg total) by mouth 2 (two) times daily as needed for up to 14 days for anxiety.  Dispense: 28 tablet; Refill: 0  Family/ staff Communication: discussed with ADON, PA, DON  Labs/tests ordered:  No new added by me today  Cardarius Senat L. Vilda Zollner, D.O. [facility] [address], [place] [address] Cell Phone (Mon-Fri 8am-5pm):  [phone] On Call:  [phone] & follow prompts after 5pm & weekends Office Phone:  [phone] Office Fax:  [phone]

## 2020-01-26 ENCOUNTER — Encounter: Payer: Self-pay | Admitting: Internal Medicine

## 2020-01-26 ENCOUNTER — Non-Acute Institutional Stay (SKILLED_NURSING_FACILITY): Payer: Medicare Other | Admitting: Internal Medicine

## 2020-01-26 DIAGNOSIS — E876 Hypokalemia: Secondary | ICD-10-CM

## 2020-01-26 DIAGNOSIS — I10 Essential (primary) hypertension: Secondary | ICD-10-CM | POA: Diagnosis not present

## 2020-01-26 NOTE — Progress Notes (Signed)
Location:  [place] Room Number: 511-P Place of Service:  SNF [phone]) Provider:  Granville Lewis, P.A.  PCP: Francesca Oman, DO  Patient Care Team: Francesca Oman, DO as PCP - General (Internal Medicine) Rehab, [place] ([place])  Extended Emergency Contact Information Primary Emergency Contact: [name] Mobile Phone: [phone] Relation: Other Secondary Emergency Contact: [name] Mobile Phone: [phone] Relation: Other  Code Status:  Full Code  Goals of care: Advanced Directive information Advanced Directives 01/24/2020  Does Patient Have a Medical Advance Directive? Yes  Type of Advance Directive Out of facility DNR (pink MOST or yellow form)  Does patient want to make changes to medical advance directive? No - Patient declined  Pre-existing out of facility DNR order (yellow form or pink MOST form) Pink MOST/Yellow Form most recent copy in chart - Physician notified to receive inpatient order     Chief Complaint  Patient presents with  . Acute Visit    Discuss need for potassium, patient is refusing.     HPI:  Pt is a 64 y.o. female seen today for an acute visit for follow-up of potassium intake.  Patient was recently readmitted to facility after hospitalization for sepsis thought secondary to UTI.  She also has a history of recurrent C. difficile and has been treated with vancomycin.  Her diarrhea has largely resolved per patient.  At times she has been noted to have a low potassium and we have supplemented this-she is currently on 20 mEq of potassium a day but apparently has refused this in the last several days saying she does not think she needs it.  Potassium earlier this week on June 8 was within normal range at 3.8.  Clinically she appears to be doing well-at times she does express frustration with her issues with the recurrent C. difficile and UTIs as well as history of multiple  sclerosis.  Vital signs appear to be stable-she was actually working with therapy today when I entered the room.     Past Medical History:  Diagnosis Date  . Actinic keratosis   . BCC (basal cell carcinoma of skin)   . Closed fracture of second metatarsal bone of right foot   . Closed fracture of third metatarsal bone of right foot with routine healing   . IBS (irritable bowel syndrome)   . Multiple sclerosis ([place])   . Neurogenic bladder   . Rectal polyp    Tubular adenoma  . Recurrent UTI   . SCC (squamous cell carcinoma)    Past Surgical History:  Procedure Laterality Date  . BOTOX INJECTION    . BREAST BIOPSY    . COLONOSCOPY    . SKIN CANCER EXCISION      No Known Allergies  Outpatient Encounter Medications as of 01/26/2020  Medication Sig  . acetaminophen (TYLENOL) 325 MG tablet Take 325 mg by mouth every 4 (four) hours as needed for mild pain, fever or headache.   Marland Kitchen amLODipine (NORVASC) 5 MG tablet Take 5 mg by mouth daily.  . baclofen (LIORESAL) 10 MG tablet Take 10 mg by mouth 3 (three) times daily.  . Cholecalciferol (VITAMIN D3) 50 MCG (2000 UT) CHEW Chew 6 tablets by mouth daily. (6000 units)  . dicyclomine (BENTYL) 20 MG tablet Take 20 mg by mouth in the morning, at noon, in the evening, and at bedtime.   Marland Kitchen FLUoxetine (PROZAC) 20 MG capsule Take 20 mg by mouth daily.  Marland Kitchen  hydrocortisone cream 1 % Apply 1 application topically every 12 (twelve) hours as needed for itching.  Marland Kitchen LACTOBACILLUS RHAMNOSUS, GG, PO Take by mouth. 10 billion cell capsule. Take 1 capsule daily x 30 days  . LORazepam (ATIVAN) 0.5 MG tablet Take 1 tablet (0.5 mg total) by mouth 2 (two) times daily as needed for up to 14 days for anxiety.  . NON FORMULARY Diet order: Regular NAS Diet.  Marland Kitchen ocrelizumab (OCREVUS) 300 MG/10ML injection Inject 600 mg into the vein every 6 (six) months.  . potassium chloride (KLOR-CON) 10 MEQ tablet Take 20 mEq by mouth daily.  . pravastatin (PRAVACHOL) 40 MG tablet  Take 40 mg by mouth daily.  . tamsulosin (FLOMAX) 0.4 MG CAPS capsule Take 0.4 mg by mouth daily.  . vitamin B-12 (CYANOCOBALAMIN) 1000 MCG tablet Take 1,000 mcg by mouth daily.   No facility-administered encounter medications on file as of 01/26/2020.    Review of Systems   General she is not complaining of any fever or chills.  Skin does not complain of rashes itching or diaphoresis at this time does complain at times of itching and does have Atarax as needed as well as hydrocortisone cream.  Head ears eyes nose mouth and throat is not complain of visual changes sore throat.  Respiratory does not complain of being short of breath or having cough.  Cardiac does not complain of chest pain or increasing edema from baseline.  GI does not complain of of abdominal pain constipation or diarrhea at this point.  GU no complaints of dysuria.  Musculoskeletal does have lower extremity weakness does not complain of joint pain currently.  Neurologic is not complaining of dizziness headache or syncope.  And psych does have a history of some depression she is on Prozac she -does not really complain of being anxious currently at times anxiety she does have as Has Ativan as needed for anxiety  r  Immunization History  Administered Date(s) Administered  . Influenza, High Dose Seasonal PF 07/01/2018  . Influenza,inj,Quad PF,6+ Mos 05/20/2013, 05/26/2014, 06/18/2015, 06/18/2016, 06/29/2017, 06/20/2019  . Influenza-Unspecified 06/20/2019  . Moderna SARS-COVID-2 Vaccination 08/19/2019, 10/08/2019  . PPD Test 08/23/2019  . Td 08/18/1998  . Tdap 03/11/2012   Pertinent  Health Maintenance Due  Topic Date Due  . PAP SMEAR-Modifier  Never done  . MAMMOGRAM  Never done  . COLONOSCOPY  Never done  . INFLUENZA VACCINE  03/18/2020   No flowsheet data found. Functional Status Survey:    Temperature 97.7 pulse 60 respirations 17 blood pressure 145/79.    Physical Exam   In general this is a  well-nourished female in no distress lying comfortably in bed she has been working with therapy.  Her skin is warm and dry.  Eyes visual acuity appears to be intact.  Chest is clear to auscultation there is no labored breathing.  Heart is regular rate and rhythm without murmur gallop or rub she has baseline mild lower extremity edema.  Musculoskeletal does move extremities at baseline with lower extremity weakness again she has been working with therapy.  Neurologic appears grossly intact her speech is clear.  Psych she is alert and oriented appears to be in better spirits than when I saw her previously.    Labs reviewed: Recent Labs    10/01/19 2151 10/10/19 0000 01/09/20 0000 01/09/20 0000 01/12/20 0000 01/21/20 0000 01/24/20 0000  NA 143   < > 147   < > 145 142 144  K 3.4*   < >  4.2   < > 4.5 3.4 3.8  CL 106   < > 111*   < > 107 111* 108  CO2 25   < > 23*   < > 23* 23* 21  GLUCOSE 116*  --   --   --   --   --   --   BUN 10   < > 10   < > 19 7 7   CREATININE 0.88   < > 0.7  --  0.8  --  0.8  CALCIUM 9.4   < > 9.1   < > 9.0 8.0* 9.5   < > = values in this interval not displayed.   Recent Labs    10/01/19 2151 10/01/19 2151 10/02/19 0000 10/31/19 0000 01/09/20 0000  AST 18   < > 14 7* 12*  ALT 15   < > 10 6* 10  ALKPHOS 52   < > 46 52 58  BILITOT 0.7  --   --   --   --   PROT 7.1  --   --   --   --   ALBUMIN 3.7   < > 3.6 3.4* 3.1*   < > = values in this interval not displayed.   Recent Labs    10/01/19 2151 10/10/19 0000 11/03/19 0000 11/03/19 0000 11/10/19 0000 11/10/19 0000 11/21/19 0000 12/13/19 0000 01/05/20 0000 01/05/20 0000 01/09/20 0000 01/21/20 0000 01/24/20 0000  WBC 21.6*   < > 23.4   < > 14.6   < > 8.1   < > 8.4   < > 10.5 7.4 6.6  NEUTROABS 18.9*   < > 20  --  12  --  6  --   --   --   --   --   --   HGB 11.7*   < > 12.1   < > 10.7*   < > 11.0*   < > 9.7*  --  10.2*  --  9.8*  HCT 37.8   < > 38   < > 33*   < > 34*   < > 30*   < > 31*  28* 31*  MCV 86.3  --   --   --   --   --   --   --   --   --   --   --   --   PLT 335   < > 335   < > 430*   < > 450*   < > 299   < > 338 288 412*   < > = values in this interval not displayed.   No results found for: TSH No results found for: HGBA1C No results found for: CHOL, HDL, LDLCALC, LDLDIRECT, TRIG, CHOLHDL  Significant Diagnostic Results in last 30 days:  No results found.  Assessment/Plan  #1 history of hypokalemia-time she has had a potassium in the lower threes and did respond  to potassium-she appeared to be having more diarrhea however at those times.  Per patient wishes will discontinue her potassium but this will need to be monitored and will recheck this on Monday, June 14.  Potassium was within normal range at 3.8 on June 8  I told her if it needs to be restarted we may try to do this just every other day to see if this will help with her hesitancy.  Of note she also would like salt on her fries she says she gets great enjoyment from  this-we will write an order to allow this secondary to patient wishes --quality of life issues  #2 hypertension her systolic is mildly elevated in the 140s today-but this does not appear to be persistent--I see previous systolics in the 229N -her Lopressor was held in the hospital secondary to soft blood pressures-at this point will monitor  [id number]

## 2020-01-27 ENCOUNTER — Encounter: Payer: Self-pay | Admitting: Internal Medicine

## 2020-01-31 LAB — BASIC METABOLIC PANEL
BUN: 9 (ref 4–21)
CO2: 24 — AB (ref 13–22)
Chloride: 103 (ref 99–108)
Creatinine: 0.6 (ref 0.5–1.1)
Glucose: 60
Potassium: 4.1 (ref 3.4–5.3)
Sodium: 143 (ref 137–147)

## 2020-01-31 LAB — COMPREHENSIVE METABOLIC PANEL
Calcium: 9.7 (ref 8.7–10.7)
GFR calc Af Amer: >90
GFR calc non Af Amer: >90

## 2020-02-06 LAB — VITAMIN D 25 HYDROXY (VIT D DEFICIENCY, FRACTURES): Vit D, 25-Hydroxy: 45.2

## 2020-02-08 ENCOUNTER — Non-Acute Institutional Stay (SKILLED_NURSING_FACILITY): Payer: Medicare Other | Admitting: Internal Medicine

## 2020-02-08 ENCOUNTER — Encounter: Payer: Self-pay | Admitting: Internal Medicine

## 2020-02-08 DIAGNOSIS — Z8619 Personal history of other infectious and parasitic diseases: Secondary | ICD-10-CM | POA: Diagnosis not present

## 2020-02-08 DIAGNOSIS — Z87440 Personal history of urinary (tract) infections: Secondary | ICD-10-CM | POA: Diagnosis not present

## 2020-02-08 DIAGNOSIS — R509 Fever, unspecified: Secondary | ICD-10-CM

## 2020-02-08 LAB — HEPATIC FUNCTION PANEL
ALT: 3 — AB (ref 7–35)
AST: 8 — AB (ref 13–35)
Alkaline Phosphatase: 56 (ref 25–125)
Bilirubin, Total: 0.3

## 2020-02-08 LAB — BASIC METABOLIC PANEL
BUN: 9 (ref 4–21)
CO2: 24 — AB (ref 13–22)
Chloride: 101 (ref 99–108)
Creatinine: 0.7 (ref 0.5–1.1)
Glucose: 111
Potassium: 3.3 — AB (ref 3.4–5.3)
Sodium: 138 (ref 137–147)

## 2020-02-08 LAB — COMPREHENSIVE METABOLIC PANEL
Albumin: 3.8 (ref 3.5–5.0)
Calcium: 9.5 (ref 8.7–10.7)
GFR calc Af Amer: >=90
GFR calc non Af Amer: >=90

## 2020-02-08 LAB — CBC AND DIFFERENTIAL
HCT: 36 (ref 36–46)
Hemoglobin: 11.4 — AB (ref 12.0–16.0)
Platelets: 304 (ref 150–399)
WBC: 7.6

## 2020-02-08 LAB — CBC: RBC: 4.41 (ref 3.87–5.11)

## 2020-02-08 NOTE — Progress Notes (Signed)
Location:    [facility] Room Number: 210/W Place of Service:  SNF [phone]) Provider:  Vista Mink, DO  Patient Care Team: Francesca Oman, DO as PCP - General (Internal Medicine) Rehab, [place] ([place]  Extended Emergency Contact Information Primary Emergency Contact: [name] Mobile Phone: [phone] Relation: Other Secondary Emergency Contact: [name] Mobile Phone: [phone] Relation: Other  Code Status:  Full Code Goals of care: Advanced Directive information Advanced Directives 02/08/2020  Does Patient Have a Medical Advance Directive? Yes  Type of Advance Directive (No Data)  Does patient want to make changes to medical advance directive? No - Patient declined  Pre-existing out of facility DNR order (yellow form or pink MOST form) -     Chief Complaint  Patient presents with  . Follow-up    Fever    HPI:  Pt is a 64 y.o. female seen today for an acute visit for follow-up of fever of unknown origin.  Apparently she spiked a temperature of 102.1 yesterday--apparently she responded to Tylenol and appears she has been afebrile since that.  She currently is not complaining of any fever or chills.  She does have a history of urosepsis as well as C. 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  She denies any increased cough or shortness of breath does not really complain of any dysuria  She does have a history of MS with neurogenic bladder as well as hyperlipidemia depression-she has a history of hypertension but her Norvasc was discontinued in the hospital as well as Metroprolol because of hypotension.  It appears her blood pressures have been stable despite being off  these medications.  Occasionally she is slightly tachycardic-but this is thought at times to be anxiety related she has required Ativan as needed at times.   Past Medical History:  Diagnosis Date  . Actinic keratosis   . BCC (basal cell carcinoma of skin)   . Closed fracture of second metatarsal bone of right foot   . Closed fracture of third metatarsal bone of right foot with routine healing   . IBS (irritable bowel syndrome)   . Multiple sclerosis ([place])   . Neurogenic bladder   . Rectal polyp    Tubular adenoma  . Recurrent UTI   . SCC (squamous cell carcinoma)    Past Surgical History:  Procedure Laterality Date  . BOTOX INJECTION    . BREAST BIOPSY    . COLONOSCOPY    . SKIN CANCER EXCISION      No Known Allergies  Outpatient Encounter Medications as of 02/08/2020  Medication Sig  . acetaminophen (TYLENOL) 325 MG tablet Take 325 mg by mouth every 4 (four) hours as needed for mild pain, fever or headache.   . baclofen (LIORESAL) 10 MG tablet Take 30 mg by mouth at bedtime.   . baclofen (LIORESAL) 20 MG tablet Take 20 mg by mouth 3 (three) times daily.  . Cholecalciferol 125 MCG (5000 UT) TABS Take 1 tablet by mouth daily. Along with a 1000 unit to = 6000 units  . dicyclomine (BENTYL) 20 MG tablet Take 20 mg by mouth in the morning, at noon, in the evening, and at bedtime.   Marland Kitchen FLUoxetine (PROZAC)  20 MG capsule Take 20 mg by mouth daily.  . hydrocortisone cream 1 % Apply 1 application topically every 12 (twelve) hours as needed for itching.  . NON FORMULARY Diet order: Regular NAS Diet.  . ondansetron (ZOFRAN) 4 MG tablet Take 4 mg by mouth every 6 (six) hours as needed for nausea or vomiting.  . pravastatin (PRAVACHOL) 40 MG tablet Take 40 mg by mouth daily.  . Probiotic Product (CULTURELLE PROBIOTICS PO) Take 1 capsule by mouth daily.  . tamsulosin (FLOMAX) 0.4 MG CAPS capsule Take 0.4 mg by mouth daily.  Marland Kitchen tiZANidine (ZANAFLEX) 2 MG tablet Take 2 mg by mouth at bedtime.   . vitamin B-12 (CYANOCOBALAMIN) 1000 MCG tablet Take 1,000 mcg by mouth daily.  . Vitamin D, Cholecalciferol, 25 MCG (1000 UT) TABS Take 1 tablet by mouth daily. Along with a 5000 unit to = 6000 units  . [DISCONTINUED] Cholecalciferol (VITAMIN D3) 50 MCG (2000 UT) CHEW Chew 3 tablets by mouth daily. (6000 units)  . [DISCONTINUED] LACTOBACILLUS RHAMNOSUS, GG, PO Take by mouth. 10 billion cell capsule. Take 1 capsule daily x 30 days  . [DISCONTINUED] potassium chloride (KLOR-CON) 10 MEQ tablet Take 20 mEq by mouth daily.   No facility-administered encounter medications on file as of 02/08/2020.    Review of Systems   General currently does not complain of feeling febrile or having chills.  Skin does not complain of rashes itching or diaphoresis.  Head ears eyes nose mouth and throat does not complain of visual changes or sore throat.  Respiratory denies any shortness of breath or increased cough.  Cardiac does not complain of chest pain or increasing edema from baseline.  GI does complain of some diarrhea is not complaining of significant abdominal pain at this point--.  GU does have a history of UTIs not really complain of dysuria however today.  Musculoskeletal does have lower extremity weakness is not complaining of pain at this time.  Does have a history of MS.  Neurologic does not complain of dizziness headache or syncope.  And psych is not complaining of being depressed or anxious currently does have a history of depression as well as anxiety.    Immunization History  Administered Date(s) Administered  . Influenza, High Dose Seasonal PF 07/01/2018  . Influenza,inj,Quad PF,6+ Mos 05/20/2013, 05/26/2014, 06/18/2015, 06/18/2016, 06/29/2017, 06/20/2019  . Influenza-Unspecified 06/20/2019  . Moderna SARS-COVID-2 Vaccination 08/19/2019, 10/08/2019  . PPD Test 08/23/2019  . Td 08/18/1998  . Tdap 03/11/2012   Pertinent  Health Maintenance Due  Topic Date Due  . PAP  SMEAR-Modifier  Never done  . MAMMOGRAM  Never done  . COLONOSCOPY  Never done  . INFLUENZA VACCINE  03/18/2020   No flowsheet data found. Functional Status Survey:    Vitals:   02/08/20 1555  BP: (!) 153/81  Pulse: 60  Resp: 20  Temp: (!) 97.2 F (36.2 C)  TempSrc: Oral  SpO2: 95%  Weight: 163 lb (73.9 kg)  Height: 5\' 5"  (1.651 m)   Body mass index is 27.12 kg/m. Physical Exam   In general this is a pleasant middle-age female in no distress lying comfortably in bed.  Her skin is warm and dry.  Eyes visual acuity appears to be intact her sclera and conjunctive are clear.  Oropharynx clear mucous membranes moist.  Chest is clear to auscultation cannot really appreciate any congestion or labored breathing.  Heart is regular rate and rhythm without murmur gallop or rub she has mild to moderate lower  extremity edema at baseline.  Abdomen is soft somewhat obese is not acutely tender slightly sore to palpation-bowel sounds are active.  GU-cannot really appreciate significant suprapubic tenderness she does have some generalized lower abdominal soreness  Musculoskeletal is able to move all extremities x4 at baseline with significant lower extremity weakness at baseline.  Neurologic is grossly intact her speech is clear she does have lower extremity weakness.  Psych she is alert and oriented pleasant and appropriate  Labs reviewed: Recent Labs    10/01/19 2151 10/10/19 0000 01/12/20 0000 01/12/20 0000 01/21/20 0000 01/24/20 0000 01/31/20 0000  NA 143   < > 145   < > 142 144 143  K 3.4*   < > 4.5   < > 3.4 3.8 4.1  CL 106   < > 107   < > 111* 108 103  CO2 25   < > 23*   < > 23* 21 24*  GLUCOSE 116*  --   --   --   --   --   --   BUN 10   < > 19   < > 7 7 9   CREATININE 0.88   < > 0.8  --   --  0.8 0.6  CALCIUM 9.4   < > 9.0   < > 8.0* 9.5 9.7   < > = values in this interval not displayed.   Recent Labs    10/01/19 2151 10/01/19 2151 10/02/19 0000  10/31/19 0000 01/09/20 0000  AST 18   < > 14 7* 12*  ALT 15   < > 10 6* 10  ALKPHOS 52   < > 46 52 58  BILITOT 0.7  --   --   --   --   PROT 7.1  --   --   --   --   ALBUMIN 3.7   < > 3.6 3.4* 3.1*   < > = values in this interval not displayed.   Recent Labs    10/01/19 2151 10/10/19 0000 11/03/19 0000 11/03/19 0000 11/10/19 0000 11/10/19 0000 11/21/19 0000 12/13/19 0000 01/05/20 0000 01/05/20 0000 01/09/20 0000 01/21/20 0000 01/24/20 0000  WBC 21.6*   < > 23.4   < > 14.6   < > 8.1   < > 8.4   < > 10.5 7.4 6.6  NEUTROABS 18.9*   < > 20  --  12  --  6  --   --   --   --   --   --   HGB 11.7*   < > 12.1   < > 10.7*   < > 11.0*   < > 9.7*  --  10.2*  --  9.8*  HCT 37.8   < > 38   < > 33*   < > 34*   < > 30*   < > 31* 28* 31*  MCV 86.3  --   --   --   --   --   --   --   --   --   --   --   --   PLT 335   < > 335   < > 430*   < > 450*   < > 299   < > 338 288 412*   < > = values in this interval not displayed.   No results found for: TSH No results found for: HGBA1C No results found for: CHOL, HDL, LDLCALC, LDLDIRECT, TRIG, CHOLHDL  Significant  Diagnostic Results in last 30 days:  No results found.  Assessment/Plan  Fever of unknown origin-she is she is having diarrhea again with a history of recurrent C. difficile will order a C. difficile culture also and metabolic panel to assess her renal function and electrolytes.  Also with history of UTI will obtain a UA C&S.  She is not complaining of any shortness of breath or cough or respiratory issues.  We will continue to monitor with vital signs pulse ox every shift for 72 hours.  At this point she does not give any type of septic presentation but this will need to be watched.  #2 hypertension-her Norvasc and metoprolol have been discontinued because of hypotension in the hospital-systolic is mildly elevated today but this does not appear to be consistent per review of readings I see-for example another recent reading was  119/73--at this point will monitor  [phone]

## 2020-02-09 ENCOUNTER — Non-Acute Institutional Stay (SKILLED_NURSING_FACILITY): Payer: Medicare Other | Admitting: Internal Medicine

## 2020-02-09 ENCOUNTER — Encounter: Payer: Self-pay | Admitting: Internal Medicine

## 2020-02-09 DIAGNOSIS — R110 Nausea: Secondary | ICD-10-CM | POA: Diagnosis not present

## 2020-02-09 DIAGNOSIS — A0472 Enterocolitis due to Clostridium difficile, not specified as recurrent: Secondary | ICD-10-CM | POA: Diagnosis not present

## 2020-02-09 DIAGNOSIS — E876 Hypokalemia: Secondary | ICD-10-CM

## 2020-02-09 NOTE — Progress Notes (Signed)
Location:    [facility] Room Number: 210/W Place of Service:  SNF [phone]) Provider:  Vista Mink, DO  Patient Care Team: Francesca Oman, DO as PCP - General (Internal Medicine) Rehab, [place] ([place])  Extended Emergency Contact Information Primary Emergency Contact: [name] Mobile Phone: [phone] Relation: Other Secondary Emergency Contact: [name] Mobile Phone: [phone] Relation: Other  Code Status:  Full Code Goals of care: Advanced Directive information Advanced Directives 02/09/2020  Does Patient Have a Medical Advance Directive? Yes  Type of Advance Directive (No Data)  Does patient want to make changes to medical advance directive? No - Patient declined  Pre-existing out of facility DNR order (yellow form or pink MOST form) -     Chief complaint-follow-up of fever with positive C. difficile result  HPI:  Pt is a 64 y.o. female seen today for an acute visit for follow-up of fever-with a positive C. difficile culture.  She was seen yesterday for fever of unknown origin which occurred a couple days ago.  Since she does have a history of C. difficile as well as urosepsis a urine culture was ordered which is pending.  A C. difficile culture is just come back positive for C. 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  She remains afebrile-does not really complain of acute abdominal pain or discomfort continues to have some diarrhea however.  Labs today are fairly unremarkable except it does show a mildly low potassium of 3.3.-Her white count remains within normal range at 7.6  She continues to rest in bed comfortably.     Past Medical History:  Diagnosis Date  . Actinic keratosis   . BCC (basal cell carcinoma of skin)   . Closed fracture of second metatarsal bone of right foot   . Closed fracture of third metatarsal bone of right foot with routine  healing   . IBS (irritable bowel syndrome)   . Multiple sclerosis ([place])   . Neurogenic bladder   . Rectal polyp    Tubular adenoma  . Recurrent UTI   . SCC (squamous cell carcinoma)    Past Surgical History:  Procedure Laterality Date  . BOTOX INJECTION    . BREAST BIOPSY    . COLONOSCOPY    . SKIN CANCER EXCISION      No Known Allergies  Outpatient Encounter Medications as of 02/09/2020  Medication Sig  . acetaminophen (TYLENOL) 325 MG tablet Take 325 mg by mouth every 4 (four) hours as needed for mild pain, fever or headache.   . baclofen (LIORESAL) 10 MG tablet Take 30 mg by mouth at bedtime.   . baclofen (LIORESAL) 20 MG tablet Take 20 mg by mouth 3 (three) times daily.  . Cholecalciferol 125 MCG (5000 UT) TABS Take 1 tablet by mouth daily. Along with a 1000 unit to = 6000 units  . dicyclomine (BENTYL) 20 MG tablet Take 20 mg by mouth in the morning, at noon, in the evening, and at bedtime.   Marland Kitchen FLUoxetine (PROZAC) 20 MG capsule Take 20 mg by mouth daily.  . hydrocortisone cream 1 % Apply 1 application topically every 12 (twelve) hours as needed for itching.  . NON FORMULARY Diet order: Regular NAS Diet.  . potassium chloride (KLOR-CON) 10 MEQ tablet Take 20 mEq by mouth daily.  . pravastatin (PRAVACHOL) 40 MG tablet Take 40 mg by mouth daily.  . Probiotic Product (CULTURELLE PROBIOTICS PO)  Take 1 capsule by mouth daily.  . tamsulosin (FLOMAX) 0.4 MG CAPS capsule Take 0.4 mg by mouth daily.  Marland Kitchen tiZANidine (ZANAFLEX) 2 MG tablet Take 2 mg by mouth at bedtime.  . vitamin B-12 (CYANOCOBALAMIN) 1000 MCG tablet Take 1,000 mcg by mouth daily.  . Vitamin D, Cholecalciferol, 25 MCG (1000 UT) TABS Take 1 tablet by mouth daily. Along with a 5000 unit to = 6000 units  . [DISCONTINUED] ondansetron (ZOFRAN) 4 MG tablet Take 4 mg by mouth every 6 (six) hours as needed for nausea or vomiting.   No facility-administered encounter medications on file as of 02/09/2020.    Review of Systems    General she is not currently complaining of fever or chills.  Skin does not complain of rash rashes does at times have some itching does not complain of diaphoresis.  Head ears eyes nose mouth and throat does not complain of visual changes or sore throat.  Respiratory is not complaining of any cough or shortness of breath.  Cardiac does not complain of chest pain or increasing edema from baseline she does have baseline lower extremity edema.  GI continues to complain of some diarrhea does not complain of acute abdominal pain does say at times she has some nausea.  GU is not complaining of dysuria does have a history of neurogenic bladder.  Musculoskeletal does have a history of MS with lower extremity weakness is not complaining of pain at this time.  Neurologic as noted above is not complaining of dizziness headache or syncope.  And psych does have a history of depression and anxiety but does not appear to be overtly depressed or anxious at this time.  .    Immunization History  Administered Date(s) Administered  . Influenza, High Dose Seasonal PF 07/01/2018  . Influenza,inj,Quad PF,6+ Mos 05/20/2013, 05/26/2014, 06/18/2015, 06/18/2016, 06/29/2017, 06/20/2019  . Influenza-Unspecified 06/20/2019  . Moderna SARS-COVID-2 Vaccination 08/19/2019, 10/08/2019  . PPD Test 08/23/2019  . Td 08/18/1998  . Tdap 03/11/2012   Pertinent  Health Maintenance Due  Topic Date Due  . PAP SMEAR-Modifier  Never done  . MAMMOGRAM  Never done  . COLONOSCOPY  Never done  . INFLUENZA VACCINE  03/18/2020   No flowsheet data found. Functional Status Survey:    Vitals:   02/09/20 1539  BP: 123/74  Pulse: 78  Resp: 19  Temp: (!) 97 F (36.1 C)  TempSrc: Oral  SpO2: 95%  Weight: 163 lb (73.9 kg)  Height: 5\' 5"  (1.651 m)   Body mass index is 27.12 kg/m. Physical Exam In general this is a pleasant female in no distress lying comfortably in bed.  Her skin is warm and dry.  Eyes  visual acuity appears to be intact sclera and conjunctive are clear.  Oropharynx clear mucous membranes moist.  Chest is clear to auscultation there is no labored breathing.  Heart is regular rate and rhythm without murmur gallop or rub she has baseline mild/moderate lower extremity edema.  Abdomen is somewhat protuberant soft somewhat short of palpitation but I would not describe this as acutely so bowel sounds are active.  Musculoskeletal does can continue with lower extremity weakness at baseline moves upper extremities at baseline.  Neurologic as noted above her speech is clear cannot really appreciate lateralizing findings.  Psych she is alert and oriented pleasant and appropriate  Labs reviewed:  February 08, 2020.  Sodium 138 potassium 3.3 BUN 24 creatinine 0.71.  Liver function tests essentially within normal limits ALT was less  than 3.  WBC 7.6 hemoglobin 11.4 platelets 304   Recent Labs    10/01/19 2151 10/10/19 0000 01/12/20 0000 01/12/20 0000 01/21/20 0000 01/24/20 0000 01/31/20 0000  NA 143   < > 145   < > 142 144 143  K 3.4*   < > 4.5   < > 3.4 3.8 4.1  CL 106   < > 107   < > 111* 108 103  CO2 25   < > 23*   < > 23* 21 24*  GLUCOSE 116*  --   --   --   --   --   --   BUN 10   < > 19   < > 7 7 9   CREATININE 0.88   < > 0.8  --   --  0.8 0.6  CALCIUM 9.4   < > 9.0   < > 8.0* 9.5 9.7   < > = values in this interval not displayed.   Recent Labs    10/01/19 2151 10/01/19 2151 10/02/19 0000 10/31/19 0000 01/09/20 0000  AST 18   < > 14 7* 12*  ALT 15   < > 10 6* 10  ALKPHOS 52   < > 46 52 58  BILITOT 0.7  --   --   --   --   PROT 7.1  --   --   --   --   ALBUMIN 3.7   < > 3.6 3.4* 3.1*   < > = values in this interval not displayed.   Recent Labs    10/01/19 2151 10/10/19 0000 11/03/19 0000 11/03/19 0000 11/10/19 0000 11/10/19 0000 11/21/19 0000 12/13/19 0000 01/05/20 0000 01/05/20 0000 01/09/20 0000 01/21/20 0000 01/24/20 0000  WBC 21.6*    < > 23.4   < > 14.6   < > 8.1   < > 8.4   < > 10.5 7.4 6.6  NEUTROABS 18.9*   < > 20  --  12  --  6  --   --   --   --   --   --   HGB 11.7*   < > 12.1   < > 10.7*   < > 11.0*   < > 9.7*  --  10.2*  --  9.8*  HCT 37.8   < > 38   < > 33*   < > 34*   < > 30*   < > 31* 28* 31*  MCV 86.3  --   --   --   --   --   --   --   --   --   --   --   --   PLT 335   < > 335   < > 430*   < > 450*   < > 299   < > 338 288 412*   < > = values in this interval not displayed.   No results found for: TSH No results found for: HGBA1C No results found for: CHOL, HDL, LDLCALC, LDLDIRECT, TRIG, CHOLHDL  Significant Diagnostic Results in last 30 days:  No results found.  Assessment/Plan  #1 history of fever with recurrent C. difficile-recent C. difficile positive culture with diarrhea-I did discuss this with Dr. Lyndel Safe via phone and will restart vancomycin-250 mg every 6 hours indefinitely-and order an infectious disease consult-this was discussed with patient as well.  She is agreeable to this.  C. difficile precautions will have to be reinitiated  2.  Mild hypokalemia-most likely result of diarrhea will start her on low-dose potassium she has had this in the past-she has been hesitant to take it at times but does understand the need with the somewhat low potassium to take this at least temporarily. We will update a metabolic panel on Monday, June 28   3.  Nausea we will start Zofran 4 mg every 8 hours as needed for nausea or vomiting.  Clinically she appears stable continues with a nonseptic appearance   [id number].

## 2020-02-11 ENCOUNTER — Encounter: Payer: Self-pay | Admitting: Internal Medicine

## 2020-02-13 LAB — COMPREHENSIVE METABOLIC PANEL
Calcium: 9 (ref 8.7–10.7)
GFR calc Af Amer: >90
GFR calc non Af Amer: 87.5

## 2020-02-13 LAB — BASIC METABOLIC PANEL
BUN: 8 (ref 4–21)
CO2: 19 (ref 13–22)
Chloride: 103 (ref 99–108)
Creatinine: 0.7 (ref 0.5–1.1)
Glucose: 108
Potassium: 3.7 (ref 3.4–5.3)
Sodium: 138 (ref 137–147)

## 2020-03-02 ENCOUNTER — Telehealth: Payer: Self-pay | Admitting: Family

## 2020-03-02 ENCOUNTER — Telehealth (INDEPENDENT_AMBULATORY_CARE_PROVIDER_SITE_OTHER): Payer: Medicare Other | Admitting: Internal Medicine

## 2020-03-02 ENCOUNTER — Other Ambulatory Visit: Payer: Self-pay

## 2020-03-02 ENCOUNTER — Other Ambulatory Visit: Payer: Self-pay | Admitting: Internal Medicine

## 2020-03-02 DIAGNOSIS — F410 Panic disorder [episodic paroxysmal anxiety] without agoraphobia: Secondary | ICD-10-CM

## 2020-03-02 DIAGNOSIS — A0471 Enterocolitis due to Clostridium difficile, recurrent: Secondary | ICD-10-CM

## 2020-03-02 MED ORDER — ALPRAZOLAM 0.25 MG PO TABS: 0.2500 mg | ORAL_TABLET | Freq: Two times a day (BID) | ORAL | 0 refills | Status: DC | PRN

## 2020-03-02 NOTE — Telephone Encounter (Signed)
Facility Nurse called states patient requesting her Ativan but has no order.States patient having panic attack.Has been on PRN ativan in the past must have had 14 days over passed.Request one time dose to be administered now.Order given.

## 2020-03-05 ENCOUNTER — Encounter: Payer: Self-pay | Admitting: Family

## 2020-03-05 ENCOUNTER — Ambulatory Visit: Payer: Medicare Other | Admitting: Internal Medicine

## 2020-03-05 ENCOUNTER — Non-Acute Institutional Stay (SKILLED_NURSING_FACILITY): Payer: Medicare Other | Admitting: Family

## 2020-03-05 DIAGNOSIS — E785 Hyperlipidemia, unspecified: Secondary | ICD-10-CM | POA: Diagnosis not present

## 2020-03-05 DIAGNOSIS — F331 Major depressive disorder, recurrent, moderate: Secondary | ICD-10-CM

## 2020-03-05 DIAGNOSIS — A0472 Enterocolitis due to Clostridium difficile, not specified as recurrent: Secondary | ICD-10-CM

## 2020-03-05 DIAGNOSIS — G35 Multiple sclerosis: Secondary | ICD-10-CM

## 2020-03-05 DIAGNOSIS — F410 Panic disorder [episodic paroxysmal anxiety] without agoraphobia: Secondary | ICD-10-CM | POA: Diagnosis not present

## 2020-03-05 DIAGNOSIS — I10 Essential (primary) hypertension: Secondary | ICD-10-CM

## 2020-03-05 MED ORDER — LORAZEPAM 0.5 MG PO TABS: 0.5000 mg | ORAL_TABLET | Freq: Every day | ORAL | 1 refills | Status: DC | PRN

## 2020-03-05 NOTE — Progress Notes (Signed)
Location:    [address] Room Number: 210/W Place of Service:  SNF (31) Provider: Marlowe Sax NP  Francesca Oman, DO  Patient Care Team: Francesca Oman, DO as PCP - General (Internal Medicine) [facility] [place] [facility])  Extended Emergency Contact Information Primary Emergency Contact: [name] Mobile Phone: [phone] Relation: Other Secondary Emergency Contact: [name] Mobile Phone: [phone] Relation: Other  Code Status:  Full Code Goals of care: Advanced Directive information Advanced Directives 03/05/2020  Does Patient Have a Medical Advance Directive? Yes  Type of Advance Directive (No Data)  Does patient want to make changes to medical advance directive? No - Patient declined  Pre-existing out of facility DNR order (yellow form or pink MOST form) -     Chief Complaint  Patient presents with   Medical Management of Chronic Issues    Routine visit of medical management    HPI:  Pt is a 64 y.o. female seen today for medical management of chronic diseases.   She complains of panic attack states her Ativan PRN order was discontinued.states was supposed to go to Infectious disease for follow up C-diff but appointment was cancelled.she is upset about her appointment cancellation  though states she called [facility] on Friday then had a Telephone visit on Saturday.No notes for review.Facility Nurse confirmed that patient had a telephone visit with Infectious Disease on Saturday 03/03/2020.currently on Vancomycin 250 mg solution every 6 hrs.on chart review, she was prescribed Vacomycin oral solution indefinitely by previous PCP until seen by infectious Disease.she states has not had any loose stool.she is incontinent of stool.she is very frustrated that she can not help herself when her bowels move.doesn't like to wait long when she calls for assistance though states has great care givers in the  facility.she requires total care assistance due to MS.Appetite is described as fair.she denies any fever,chills,abdominal pain/cramps.nausea or vomiting.  Depression - on Prozac 20 mg tablet daily states emotional during visit.off her Ativan as above.Needs refill.  Hyperlipidemia - on pravastatin 40 mg tablet daily.No latest LDL for review.she denies any muscle aches or weakness.  Past Medical History:  Diagnosis Date   Actinic keratosis    BCC (basal cell carcinoma of skin)    Closed fracture of second metatarsal bone of right foot    Closed fracture of third metatarsal bone of right foot with routine healing    IBS (irritable bowel syndrome)    Multiple sclerosis (HCC)    Neurogenic bladder    Rectal polyp    Tubular adenoma   Recurrent UTI    SCC (squamous cell carcinoma)    Past Surgical History:  Procedure Laterality Date   BOTOX INJECTION     BREAST BIOPSY     COLONOSCOPY     SKIN CANCER EXCISION      No Known Allergies  Allergies as of 03/05/2020   No Known Allergies     Medication List       Accurate as of March 05, 2020  2:46 PM. If you have any questions, ask your nurse or doctor.        STOP taking these medications   ALPRAZolam 0.25 MG tablet Commonly known as: XANAX Stopped by: Sandrea Hughs, NP     TAKE these medications   acetaminophen 325 MG tablet Commonly known as: TYLENOL Take 325 mg by mouth every 4 (four) hours as needed for mild pain, fever or headache.  baclofen 10 MG tablet Commonly known as: LIORESAL Take 30 mg by mouth at bedtime.   baclofen 20 MG tablet Commonly known as: LIORESAL Take 20 mg by mouth 3 (three) times daily.   Cholecalciferol 125 MCG (5000 UT) Tabs Take 1 tablet by mouth daily. Along with a 1000 unit to = 6000 units   Vitamin D (Cholecalciferol) 25 MCG (1000 UT) Tabs Take 1 tablet by mouth daily. Along with a 5000 unit to = 6000 units   CULTURELLE PROBIOTICS PO Take 1 capsule by mouth daily.     dicyclomine 20 MG tablet Commonly known as: BENTYL Take 20 mg by mouth in the morning, at noon, in the evening, and at bedtime.   FLUoxetine 20 MG capsule Commonly known as: PROZAC Take 20 mg by mouth daily.   hydrocortisone cream 1 % Apply 1 application topically every 12 (twelve) hours as needed for itching.   NON FORMULARY Diet order: Regular NAS Diet.   potassium chloride 10 MEQ tablet Commonly known as: KLOR-CON Take 20 mEq by mouth daily.   pravastatin 40 MG tablet Commonly known as: PRAVACHOL Take 40 mg by mouth daily.   tamsulosin 0.4 MG Caps capsule Commonly known as: FLOMAX Take 0.4 mg by mouth daily.   tiZANidine 2 MG tablet Commonly known as: ZANAFLEX Take 2 mg by mouth at bedtime.   Vancomycin HCl 250 MG/5ML Solr Take 250 mg by mouth every 6 (six) hours.   vitamin B-12 1000 MCG tablet Commonly known as: CYANOCOBALAMIN Take 1,000 mcg by mouth daily.   Zofran 4 MG tablet Generic drug: ondansetron Take 4 mg by mouth every 6 (six) hours as needed for nausea or vomiting.       Review of Systems  Constitutional: Negative for appetite change, chills, fatigue and fever.  HENT: Negative for congestion, rhinorrhea, sinus pressure, sinus pain, sneezing, sore throat and trouble swallowing.   Eyes: Negative for discharge, redness and itching.  Respiratory: Negative for cough, chest tightness, shortness of breath and wheezing.   Cardiovascular: Negative for chest pain, palpitations and leg swelling.  Gastrointestinal: Negative for abdominal distention, abdominal pain, constipation, diarrhea, nausea and vomiting.       C-diff per HPI   Endocrine: Negative for cold intolerance, heat intolerance, polydipsia, polyphagia and polyuria.  Genitourinary: Negative for difficulty urinating, dysuria, flank pain, frequency and urgency.       Incontinent   Musculoskeletal: Positive for gait problem. Negative for joint swelling, myalgias and neck pain.  Skin: Negative for  color change, pallor, rash and wound.  Neurological: Positive for weakness. Negative for dizziness, seizures, speech difficulty and light-headedness.       Paraplegia   Hematological: Does not bruise/bleed easily.  Psychiatric/Behavioral: Negative for agitation and sleep disturbance. The patient is nervous/anxious.     Immunization History  Administered Date(s) Administered   Influenza, High Dose Seasonal PF 07/01/2018   Influenza,inj,Quad PF,6+ Mos 05/20/2013, 05/26/2014, 06/18/2015, 06/18/2016, 06/29/2017, 06/20/2019   Influenza-Unspecified 06/20/2019   Moderna SARS-COVID-2 Vaccination 08/19/2019, 10/08/2019   PPD Test 08/23/2019   Td 08/18/1998   Tdap 03/11/2012   Pertinent  Health Maintenance Due  Topic Date Due   PAP SMEAR-Modifier  Never done   MAMMOGRAM  Never done   COLONOSCOPY  Never done   INFLUENZA VACCINE  03/18/2020   No flowsheet data found.   Vitals:   03/05/20 1445  BP: 130/69  Pulse: 78  Resp: 20  Temp: (!) 97.2 F (36.2 C)  TempSrc: Oral  SpO2: 95%  Weight:  169 lb 14.4 oz (77.1 kg)  Height: 5\' 5"  (1.651 m)   Body mass index is 28.27 kg/m. Physical Exam Vitals reviewed.  Constitutional:      General: She is not in acute distress.    Appearance: She is not ill-appearing.  HENT:     Head: Normocephalic.     Nose: Nose normal. No congestion or rhinorrhea.     Mouth/Throat:     Mouth: Mucous membranes are moist.     Pharynx: Oropharynx is clear. No oropharyngeal exudate or posterior oropharyngeal erythema.  Eyes:     General: No scleral icterus.       Right eye: No discharge.        Left eye: No discharge.     Extraocular Movements: Extraocular movements intact.     Conjunctiva/sclera: Conjunctivae normal.     Pupils: Pupils are equal, round, and reactive to light.  Neck:     Vascular: No carotid bruit.  Cardiovascular:     Rate and Rhythm: Normal rate and regular rhythm.     Pulses: Normal pulses.     Heart sounds: Normal heart  sounds. No murmur heard.  No friction rub. No gallop.   Pulmonary:     Effort: Pulmonary effort is normal. No respiratory distress.     Breath sounds: Normal breath sounds. No wheezing, rhonchi or rales.  Chest:     Chest wall: No tenderness.  Abdominal:     General: Bowel sounds are normal. There is no distension.     Palpations: Abdomen is soft. There is no mass.     Tenderness: There is no abdominal tenderness. There is no right CVA tenderness, left CVA tenderness, guarding or rebound.  Musculoskeletal:        General: No swelling or tenderness.     Cervical back: Normal range of motion. No rigidity or tenderness.     Right lower leg: No edema.     Left lower leg: No edema.     Comments: Bed bound requires assistance with transfer  Lymphadenopathy:     Cervical: No cervical adenopathy.  Skin:    General: Skin is warm.     Coloration: Skin is not pale.     Findings: No bruising, erythema or rash.  Neurological:     Mental Status: She is alert and oriented to person, place, and time.     Motor: Weakness present.     Gait: Gait abnormal.     Comments: Paraplegic   Psychiatric:        Mood and Affect: Mood is anxious and depressed. Affect is labile and tearful.        Speech: Speech normal.        Behavior: Behavior is agitated.        Thought Content: Thought content normal.    Labs reviewed: Recent Labs    10/01/19 2151 10/10/19 0000 01/31/20 0000 02/08/20 0000 02/13/20 0000  NA 143   < > 143 138 138  K 3.4*   < > 4.1 3.3* 3.7  CL 106   < > 103 101 103  CO2 25   < > 24* 24* 19  GLUCOSE 116*  --   --   --   --   BUN 10   < > 9 9 8   CREATININE 0.88   < > 0.6 0.7 0.7  CALCIUM 9.4   < > 9.7 9.5 9.0   < > = values in this interval not displayed.   Recent  Labs    10/01/19 2151 10/02/19 0000 10/31/19 0000 01/09/20 0000 02/08/20 0000  AST 18   < > 7* 12* 8*  ALT 15   < > 6* 10 3*  ALKPHOS 52   < > 52 58 56  BILITOT 0.7  --   --   --   --   PROT 7.1  --   --    --   --   ALBUMIN 3.7   < > 3.4* 3.1* 3.8   < > = values in this interval not displayed.   Recent Labs    10/01/19 2151 10/10/19 0000 11/03/19 0000 11/03/19 0000 11/10/19 0000 11/10/19 0000 11/21/19 0000 12/13/19 0000 01/09/20 0000 01/09/20 0000 01/21/20 0000 01/24/20 0000 02/08/20 0000  WBC 21.6*   < > 23.4   < > 14.6   < > 8.1   < > 10.5   < > 7.4 6.6 7.6  NEUTROABS 18.9*   < > 20  --  12  --  6  --   --   --   --   --   --   HGB 11.7*   < > 12.1   < > 10.7*   < > 11.0*   < > 10.2*  --   --  9.8* 11.4*  HCT 37.8   < > 38   < > 33*   < > 34*   < > 31*   < > 28* 31* 36  MCV 86.3  --   --   --   --   --   --   --   --   --   --   --   --   PLT 335   < > 335   < > 430*   < > 450*   < > 338   < > 288 412* 304   < > = values in this interval not displayed.    Significant Diagnostic Results in last 30 days:  No results found.  Assessment/Plan 1. Panic attacks anxious and tearful during visit.Her PRN Ativan expired.will renew ativan for 14 days per facility narcotic policy.Declined for Ativan to be scheduled prefers to take as needed.she doesn't take ativan on a daily basis.  - LORazepam (ATIVAN) 0.5 MG tablet; Take 1 tablet (0.5 mg total) by mouth daily as needed for anxiety.  Dispense: 30 tablet; Refill: 1  2. Clostridium difficile diarrhea - continue on vacomycin oral solution.No diarrhea today. - continue on Contact precaution. - follow up with infectious disease as directed.   3. Essential hypertension B/p stable.Not on meds.  4. Hyperlipidemia LDL goal <100 No LDL for review. Will need fasting Lipid panel with next lab work - continue on Pravastatin 40 mg tablet daily.   5. Moderate episode of recurrent major depressive disorder (HCC) - labile mood at times. - continue on Prozac 20 mg capsule daily. - continue to monitor for mood changes   6. Multiple sclerosis ([place]) Paraplegic.Having hard time adjusting to requiring assistance with her ADL's.easily irritated.  Continue on tizanidine and baclofen.  Family/ staff Communication: Reviewed plan of care with patient and Nurse  Labs/tests ordered: None

## 2020-03-19 ENCOUNTER — Telehealth: Payer: Self-pay

## 2020-03-19 NOTE — Telephone Encounter (Signed)
Received call today from [facility] facility regarding appointment for patient. States patient has reoccurring C. Diff and on staff provider is not sure what to do for patient. Was scheduled for 7/16, but notes are not found in [place]. Advised facility to page MD for plan of care.

## 2020-03-23 ENCOUNTER — Encounter: Payer: Self-pay | Admitting: Internal Medicine

## 2020-03-23 ENCOUNTER — Non-Acute Institutional Stay (SKILLED_NURSING_FACILITY): Payer: Medicare Other | Admitting: Internal Medicine

## 2020-03-23 DIAGNOSIS — G35 Multiple sclerosis: Secondary | ICD-10-CM | POA: Diagnosis not present

## 2020-03-23 DIAGNOSIS — L299 Pruritus, unspecified: Secondary | ICD-10-CM

## 2020-03-23 DIAGNOSIS — F410 Panic disorder [episodic paroxysmal anxiety] without agoraphobia: Secondary | ICD-10-CM | POA: Diagnosis not present

## 2020-03-23 DIAGNOSIS — A0471 Enterocolitis due to Clostridium difficile, recurrent: Secondary | ICD-10-CM

## 2020-03-23 DIAGNOSIS — Z87440 Personal history of urinary (tract) infections: Secondary | ICD-10-CM

## 2020-03-23 DIAGNOSIS — K580 Irritable bowel syndrome with diarrhea: Secondary | ICD-10-CM | POA: Diagnosis not present

## 2020-03-23 DIAGNOSIS — F322 Major depressive disorder, single episode, severe without psychotic features: Secondary | ICD-10-CM

## 2020-03-23 MED ORDER — VANCOMYCIN 50 MG/ML ORAL SOLUTION: ORAL | 0 refills | Status: DC

## 2020-03-23 MED ORDER — FLUOXETINE HCL 40 MG PO CAPS
40.0000 mg | ORAL_CAPSULE | Freq: Every day | ORAL | 3 refills | Status: DC
Start: 2020-03-23 — End: 2020-08-09

## 2020-03-23 MED ORDER — CETIRIZINE HCL 5 MG PO TABS: 5.0000 mg | ORAL_TABLET | Freq: Every day | ORAL | 11 refills | Status: AC

## 2020-03-23 NOTE — Progress Notes (Signed)
Location:  [facility] Room Number: [place] of Service:  SNF [phone]) Provider:  Kwana Ringel L. Mariea Clonts, D.O., C.M.D.  [name], DO  Patient Care Team: [name], DO as PCP - General (Geriatric Medicine) Rehab, [facility] ([place]) [place], Nelda Bucks, NP as Nurse Practitioner (Family Medicine) Carlyle Basques, MD as Consulting Physician (Infectious Diseases) Ala Bent, MD as Referring Physician (Neurology)  Extended Emergency Contact Information Primary Emergency Contact: [name] Mobile Phone: [phone] Relation: Other Secondary Emergency Contact: [name] Mobile Phone: [phone] Relation: Other  Code Status:  FULL CODE Goals of care: Advanced Directive information Advanced Directives 03/23/2020  Does Patient Have a Medical Advance Directive? Yes  Type of Advance Directive (No Data)  Does patient want to make changes to medical advance directive? No - Patient declined  Pre-existing out of facility DNR order (yellow form or pink MOST form) -     Chief Complaint  Patient presents with  . Acute Visit    depression, C-Diff, MS     HPI:  Pt is a 64 y.o. female with MS seen today for an acute visit for her depression and c diff.  She was seen by Lubertha South from neurology for her MS.  Dr. Shelia Media called me to discuss her care due to considerable changes in Levy's mental health since moving into SNF.  She was entirely different from when she'd seen her before--historically one of the most motivated MS patients she had and an inspiration to others.  With her recurring c diff, she's become very depressed and had stopped making progress with therapy.  Dr. Pennie Banter exam and MRI findings indicated that she should be making more progress while therapy here had seen limited bed mobility even.  She said she would have expected her to struggle walking but not be struggling to reposition in the bed.  We both  agreed that her depression and anxiety need to be addressed.  Ideally, she should be in a setting where she can interact with other younger residents.    When seen, she is very clear she wants to be involved in all meetings, discussions about her care.  She would like to participate in decision-making and there is not reason why she cannot do this being cognitively intact and her own "responsible party".  She is frustrated over a lack of control she has in life at this point.    She'd had a phone call with Dr. Baxter Flattery from ID last weekend and again today.  I reviewed the nursing notes and Kayla has not had loose stool in at least a week.  She remains incontinent of bowel and bladder.   Uses adult undergarments.  She is not having fevers, cramping, hot flashes (which she presents with if she has a UTI or when she started with her c diff recurrence) or abdominal pain.  She actually feels physically ok.    She c/o itching that feels like it's coming from the inside out.  I suspect this is more MS induced, but she does have some dry scaly skin, as well (? Seborrhea related to neurologic disease).  She uses a steroid cream on her skin, but still is itchy when it wears off.  She had been hesitant historically to try an antihistamine due to drowsiness it might cause, but is now willing to try.     She is agreeable to higher dose fluoxetine .  She wants to be  sure she can still get lorazepam if needed.  I also learned that just before she came here, she'd had a full workup and a power chair built for her with all the bells and whistles (had been at [facility]).  When she had to come to snf level, insurance could no longer cover it.  She currently has a manual wheelchair which is challenging for her to manipulate since her MS affects her left arm and right leg.   She indicates that she's not been getting the restorative or transitional therapy due to her c diff contact isolation.  Of course, she'd been dismissed  from standard PT when she had not progressed and continued to struggle with bed mobility.  She was having active issues with her c diff then.  Past Medical History:  Diagnosis Date  . Actinic keratosis   . BCC (basal cell carcinoma of skin)   . Closed fracture of second metatarsal bone of right foot   . Closed fracture of third metatarsal bone of right foot with routine healing   . IBS (irritable bowel syndrome)   . Multiple sclerosis ([place])   . Neurogenic bladder   . Rectal polyp    Tubular adenoma  . Recurrent UTI   . SCC (squamous cell carcinoma)    Past Surgical History:  Procedure Laterality Date  . BOTOX INJECTION    . BREAST BIOPSY    . COLONOSCOPY    . SKIN CANCER EXCISION      No Known Allergies  Outpatient Encounter Medications as of 03/23/2020  Medication Sig  . acetaminophen (TYLENOL) 325 MG tablet Take 325 mg by mouth every 4 (four) hours as needed for mild pain, fever or headache.   . baclofen (LIORESAL) 10 MG tablet Take 30 mg by mouth at bedtime.   . baclofen (LIORESAL) 20 MG tablet Take 20 mg by mouth 3 (three) times daily.  . Cholecalciferol 125 MCG (5000 UT) TABS Take 1 tablet by mouth daily. Along with a 1000 unit to = 6000 units  . dicyclomine (BENTYL) 20 MG tablet Take 20 mg by mouth in the morning, at noon, in the evening, and at bedtime.   Marland Kitchen FLUoxetine (PROZAC) 20 MG capsule Take 20 mg by mouth daily.  . hydrocortisone cream 1 % Apply 1 application topically every 12 (twelve) hours as needed for itching.  . NON FORMULARY Diet order: Regular NAS Diet.  . ondansetron (ZOFRAN) 4 MG tablet Take 4 mg by mouth every 6 (six) hours as needed for nausea or vomiting.  . potassium chloride (KLOR-CON) 10 MEQ tablet Take 20 mEq by mouth daily.  . pravastatin (PRAVACHOL) 40 MG tablet Take 40 mg by mouth daily.  . Probiotic Product (CULTURELLE PROBIOTICS PO) Take 1 capsule by mouth daily.  . tamsulosin (FLOMAX) 0.4 MG CAPS capsule Take 0.4 mg by mouth daily.  Marland Kitchen  tiZANidine (ZANAFLEX) 2 MG tablet Take 2 mg by mouth at bedtime.  . Vancomycin HCl 250 MG/5ML SOLR Take 250 mg by mouth every 6 (six) hours.  . vitamin B-12 (CYANOCOBALAMIN) 1000 MCG tablet Take 1,000 mcg by mouth daily.  . Vitamin D, Cholecalciferol, 25 MCG (1000 UT) TABS Take 1 tablet by mouth daily. Along with a 5000 unit to = 6000 units  . [DISCONTINUED] LORazepam (ATIVAN) 0.5 MG tablet Take 1 tablet (0.5 mg total) by mouth daily as needed for anxiety.   No facility-administered encounter medications on file as of 03/23/2020.    Review of Systems  Constitutional: Negative for  chills, fever and weight loss.  HENT: Negative for congestion and sore throat.   Respiratory: Negative for cough and shortness of breath.   Cardiovascular: Negative for chest pain, palpitations and leg swelling.  Gastrointestinal: Negative for abdominal pain, blood in stool, constipation, diarrhea and melena.       No BM at all yesterday and so far today  Genitourinary: Negative for dysuria.  Musculoskeletal: Negative for falls.       Spasticity  Skin: Positive for itching. Negative for rash.  Neurological: Positive for focal weakness and weakness. Negative for dizziness and loss of consciousness.  Psychiatric/Behavioral: Positive for depression. Negative for memory loss and suicidal ideas. The patient is nervous/anxious. The patient does not have insomnia.     Immunization History  Administered Date(s) Administered  . Influenza, High Dose Seasonal PF 07/01/2018  . Influenza,inj,Quad PF,6+ Mos 05/20/2013, 05/26/2014, 06/18/2015, 06/18/2016, 06/29/2017, 06/20/2019  . Influenza-Unspecified 06/20/2019  . Moderna SARS-COVID-2 Vaccination 08/19/2019, 10/08/2019  . PPD Test 08/23/2019  . Td 08/18/1998  . Tdap 03/11/2012   Pertinent  Health Maintenance Due  Topic Date Due  . PAP SMEAR-Modifier  Never done  . MAMMOGRAM  Never done  . COLONOSCOPY  Never done  . INFLUENZA VACCINE  03/18/2020   No flowsheet data  found. Functional Status Survey:    Vitals:   03/23/20 1315  BP: (!) 147/78  Pulse: 91  Temp: 97.8 F (36.6 C)  Weight: 169 lb 14.4 oz (77.1 kg)  Height: 5\' 5"  (1.651 m)   Body mass index is 28.27 kg/m. Physical Exam Vitals reviewed.  Constitutional:      General: She is not in acute distress.    Appearance: Normal appearance. She is not toxic-appearing.  HENT:     Head: Normocephalic and atraumatic.  Cardiovascular:     Rate and Rhythm: Normal rate and regular rhythm.  Pulmonary:     Effort: Pulmonary effort is normal.     Breath sounds: Normal breath sounds. No rales.  Abdominal:     General: Bowel sounds are normal.  Musculoskeletal:     Right lower leg: No edema.     Left lower leg: No edema.  Skin:    General: Skin is warm and dry.     Comments: Xerosis of arms  Neurological:     Mental Status: She is alert.     Comments: Left arm and right leg weakness; in manual wheelchair with legs dangling at present  Psychiatric:     Comments: Crying off and on throughout visit     Labs reviewed: Recent Labs    10/01/19 2151 10/10/19 0000 01/31/20 0000 02/08/20 0000 02/13/20 0000  NA 143   < > 143 138 138  K 3.4*   < > 4.1 3.3* 3.7  CL 106   < > 103 101 103  CO2 25   < > 24* 24* 19  GLUCOSE 116*  --   --   --   --   BUN 10   < > 9 9 8   CREATININE 0.88   < > 0.6 0.7 0.7  CALCIUM 9.4   < > 9.7 9.5 9.0   < > = values in this interval not displayed.   Recent Labs    10/01/19 2151 10/02/19 0000 10/31/19 0000 01/09/20 0000 02/08/20 0000  AST 18   < > 7* 12* 8*  ALT 15   < > 6* 10 3*  ALKPHOS 52   < > 52 58 56  BILITOT 0.7  --   --   --   --  PROT 7.1  --   --   --   --   ALBUMIN 3.7   < > 3.4* 3.1* 3.8   < > = values in this interval not displayed.   Recent Labs    10/01/19 2151 10/10/19 0000 11/03/19 0000 11/03/19 0000 11/10/19 0000 11/10/19 0000 11/21/19 0000 12/13/19 0000 01/09/20 0000 01/09/20 0000 01/21/20 0000 01/24/20 0000 02/08/20  0000  WBC 21.6*   < > 23.4   < > 14.6   < > 8.1   < > 10.5   < > 7.4 6.6 7.6  NEUTROABS 18.9*   < > 20  --  12  --  6  --   --   --   --   --   --   HGB 11.7*   < > 12.1   < > 10.7*   < > 11.0*   < > 10.2*  --   --  9.8* 11.4*  HCT 37.8   < > 38   < > 33*   < > 34*   < > 31*   < > 28* 31* 36  MCV 86.3  --   --   --   --   --   --   --   --   --   --   --   --   PLT 335   < > 335   < > 430*   < > 450*   < > 338   < > 288 412* 304   < > = values in this interval not displayed.   No results found for: TSH No results found for: HGBA1C No results found for: CHOL, HDL, LDLCALC, LDLDIRECT, TRIG, CHOLHDL   Assessment/Plan 1. 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 Shelia Media as planned -resume transitional therapy now that she's off isolation  2. Recurrent Clostridium difficile diarrhea -not having loose stools anymore so d/c contact isolation -start vancomycin taper:  125mg  po tid for 5 days, then bid for 5 days, then daily for 5 days, then stop -f/u with Dr. Baxter Flattery in 3 weeks (ID clinic to call resident who says she will inform staff) -this was all d/w Dr. Baxter Flattery today by me -counseled that she will remain colonized so rechecking stool is not to be done if she has recurrence, but we may need to resume treatment if it is a true recurrence  3. Irritable bowel syndrome with diarrhea -has some of this at baseline and when anxiety flares up, it's far worse  4. Panic attacks -cont lorazepam 0.5mg  po daily prn panic attacks -hopefully these will lessen with increase in fluoxetine  5. Current severe  episode of major depressive disorder without psychotic features without prior episode (HCC) -increase fluoxetine to 40mg  daily and monitor  6. History of recurrent UTIs -needs to have excellent pericare when she has incontinence episodes to help prevent  7.  Pruritic condition:  Suspect MS-induced, resident wants to try zyrtec so will do low dose 5mg  nightly  Family/ staff Communication: discussed with pt, Dr.  Pharr, Dr. Baxter Flattery, ADON, reviewed matrix and epic notes  Labs/tests ordered:  No new needed  Izack Hoogland L. Taegen Lennox, D.O. [facility] [address], [place] [address] Cell Phone (Mon-Fri 8am-5pm):  [phone] On Call:  [phone] & follow prompts after 5pm & weekends Office Phone:  [phone] Office Fax:  [phone]

## 2020-03-25 NOTE — Progress Notes (Signed)
Virtual Visit via Telephone Note  I connected with Renee Huang on 04/05/20 at  2:00 PM EDT by telephone and verified that I am speaking with the correct person using two identifiers.  Location: Patient: at [facility] Provider: clinic   I discussed the limitations, risks, security and privacy concerns of performing an evaluation and management service by telephone and the availability of in person appointments. I also discussed with the patient that there may be a patient responsible charge related to this service. The patient expressed understanding and agreed to proceed.   History of Present Illness:   63yo F with MS who has had recurrent cdifficile diarrhea where she previously was oral vancomycin taper then had relapse and back to 4 times per day. She reports her stools have been solid for the a few days but has been on isolation ever since being diagnosed with cdifficile many weeks ago. She denies cramping, no blood in her stool. She is unclear why she is having this appointment but needs guidance on end point to treatment Observations/Objective: Afebrile, solid stools per report  Assessment and Plan: Recommend to taper her oral vancomycin from QID to TID x 5-7 days, then BID for 5-7 days, then once aday for 5 days.  Avoid unnecessary antibiotics. See again in 3-4 wk per telephone visit to see that she is still free of diarrhea. If has relapse then consider fidaxomicin plus zinplava.  Follow Up Instructions:    I discussed the assessment and treatment plan with the patient. The patient was provided an opportunity to ask questions and all were answered. The patient agreed with the plan and demonstrated an understanding of the instructions.   The patient was advised to call back or seek an in-person evaluation if the symptoms worsen or if the condition fails to improve as anticipated.  I provided 20 minutes of non-face-to-face time during this encounter.   Carlyle Basques,  MD

## 2020-03-26 ENCOUNTER — Telehealth: Payer: Self-pay

## 2020-03-26 NOTE — Telephone Encounter (Signed)
Left patient a voice mail to call back to schedule an e-visit with Dr. Baxter Flattery

## 2020-03-26 NOTE — Telephone Encounter (Signed)
Called patient to get an appointment scheduled with Dr. Baxter Flattery as a phone appointment, left a voicemail for her to call back

## 2020-03-26 NOTE — Telephone Encounter (Signed)
-----   Message from Eugenia Mcalpine, LPN sent at 03/23/1482 10:15 AM EDT ----- Regarding: needs appointment Please call patient to schedule. Thanks ----- Message ----- From: Carlyle Basques, MD Sent: 03/23/2020   3:37 PM EDT To: Eugenia Mcalpine, LPN  Can I see her back in 3-4 wk telephone visit

## 2020-04-04 ENCOUNTER — Encounter: Payer: Self-pay | Admitting: Family

## 2020-04-04 ENCOUNTER — Non-Acute Institutional Stay (SKILLED_NURSING_FACILITY): Payer: Medicare Other | Admitting: Family

## 2020-04-04 DIAGNOSIS — E785 Hyperlipidemia, unspecified: Secondary | ICD-10-CM

## 2020-04-04 DIAGNOSIS — G35 Multiple sclerosis: Secondary | ICD-10-CM

## 2020-04-04 DIAGNOSIS — A0471 Enterocolitis due to Clostridium difficile, recurrent: Secondary | ICD-10-CM | POA: Diagnosis not present

## 2020-04-04 DIAGNOSIS — F322 Major depressive disorder, single episode, severe without psychotic features: Secondary | ICD-10-CM | POA: Diagnosis not present

## 2020-04-04 DIAGNOSIS — I10 Essential (primary) hypertension: Secondary | ICD-10-CM | POA: Diagnosis not present

## 2020-04-04 DIAGNOSIS — F410 Panic disorder [episodic paroxysmal anxiety] without agoraphobia: Secondary | ICD-10-CM | POA: Diagnosis not present

## 2020-04-04 NOTE — Progress Notes (Signed)
Location:  [facility] Room Number: 210-W Place of Service:  SNF (31) Provider:  Marlowe Sax, NP  Patient Care Team: [name], DO as PCP - General (Geriatric Medicine) Rehab, [place] ([place]) [place], Nelda Bucks, NP as Nurse Practitioner (Family Medicine) Carlyle Basques, MD as Consulting Physician (Infectious Diseases) Ala Bent, MD as Referring Physician (Neurology)  Extended Emergency Contact Information Primary Emergency Contact: [name] Mobile Phone: [phone] Relation: Other Secondary Emergency Contact: [name] Mobile Phone: [phone] Relation: Other  Code Status:  FULL CODE Goals of care: Advanced Directive information Advanced Directives 03/23/2020  Does Patient Have a Medical Advance Directive? Yes  Type of Advance Directive (No Data)  Does patient want to make changes to medical advance directive? No - Patient declined  Pre-existing out of facility DNR order (yellow form or pink MOST form) -     Chief Complaint  Patient presents with  . Medical Management of Chronic Issues    Routine [facility] visit    HPI:  Pt is a 64 y.o. female seen today for medical management of chronic diseases. She has a medical history of Hypertension,Hyperlipidemia,Depression,Anxiety,Multiple sclerosis follows up with Neurologist Dr.Pharr last seen 03/19/2020.she states her anxiety has been much better since her ativan was refilled.Has been getting Ativan as needed.she is upset today states was supposed to be out bed on wheelchair but has not been changed and now it's time for Lunch won't get to be out of bed for lunch.States enjoys going to the dinning for lunch.Notified DON patient's concern states patient request to be gotten up at 10 am. States previous C-diff diarrhea has resolved.No fall episode or recent acute illness. Blood pressure log reading 120's/70's.   Past Medical History:    Diagnosis Date  . Actinic keratosis   . BCC (basal cell carcinoma of skin)   . Closed fracture of second metatarsal bone of right foot   . Closed fracture of third metatarsal bone of right foot with routine healing   . IBS (irritable bowel syndrome)   . Multiple sclerosis ([place])   . Neurogenic bladder   . Rectal polyp    Tubular adenoma  . Recurrent UTI   . SCC (squamous cell carcinoma)    Past Surgical History:  Procedure Laterality Date  . BOTOX INJECTION    . BREAST BIOPSY    . COLONOSCOPY    . SKIN CANCER EXCISION      No Known Allergies  Outpatient Encounter Medications as of 04/04/2020  Medication Sig  . acetaminophen (TYLENOL) 325 MG tablet Take 325 mg by mouth every 4 (four) hours as needed for mild pain, fever or headache.   . baclofen (LIORESAL) 10 MG tablet Take 30 mg by mouth at bedtime.   . baclofen (LIORESAL) 20 MG tablet Take 20 mg by mouth 3 (three) times daily.  . cetirizine (ZYRTEC) 5 MG tablet Take 1 tablet (5 mg total) by mouth at bedtime.  . Cholecalciferol 125 MCG (5000 UT) TABS Take 1 tablet by mouth daily. Along with a 1000 unit to = 6000 units  . dicyclomine (BENTYL) 20 MG tablet Take 20 mg by mouth in the morning, at noon, in the evening, and at bedtime.   Marland Kitchen FLUoxetine (PROZAC) 40 MG capsule Take 1 capsule (40 mg total) by mouth daily.  . hydrocortisone cream 1 % Apply 1 application topically every 12 (twelve) hours as needed for itching.  Marland Kitchen LORazepam (ATIVAN) 0.5 MG tablet  Take 0.5 mg by mouth daily as needed (Panic attack).  . NON FORMULARY Diet order: Regular NAS Diet.  . ondansetron (ZOFRAN) 4 MG tablet Take 4 mg by mouth every 6 (six) hours as needed for nausea or vomiting.  . potassium chloride (KLOR-CON) 10 MEQ tablet Take 10 mEq by mouth daily.   . pravastatin (PRAVACHOL) 40 MG tablet Take 40 mg by mouth daily.  . Probiotic Product (CULTURELLE PROBIOTICS PO) Take 1 capsule by mouth daily.  . tamsulosin (FLOMAX) 0.4 MG CAPS capsule Take 0.4 mg  by mouth daily.  Marland Kitchen tiZANidine (ZANAFLEX) 2 MG tablet Take 2 mg by mouth at bedtime.  . Vancomycin HCl (FIRVANQ) 50 MG/ML SOLR Take 125 mg by mouth daily.  . vitamin B-12 (CYANOCOBALAMIN) 1000 MCG tablet Take 1,000 mcg by mouth daily.  . Vitamin D, Cholecalciferol, 25 MCG (1000 UT) TABS Take 1 tablet by mouth daily. Along with a 5000 unit to = 6000 units  . [DISCONTINUED] vancomycin (VANCOCIN) 50 mg/mL oral solution Take 2.5 mLs (125 mg total) by mouth in the morning, at noon, and at bedtime for 5 days, THEN 2.5 mLs (125 mg total) in the morning and at bedtime for 5 days, THEN 2.5 mLs (125 mg total) daily for 5 days. Then stop.   No facility-administered encounter medications on file as of 04/04/2020.    Review of Systems  Constitutional: Negative for appetite change, chills, fatigue and fever.  HENT: Negative for congestion, rhinorrhea, sinus pressure, sinus pain, sneezing and sore throat.   Eyes: Negative for discharge, redness and itching.  Respiratory: Negative for cough, chest tightness, shortness of breath and wheezing.   Cardiovascular: Negative for chest pain, palpitations and leg swelling.  Gastrointestinal: Negative for abdominal distention, abdominal pain, constipation, diarrhea, nausea and vomiting.  Endocrine: Negative for cold intolerance, heat intolerance, polydipsia, polyphagia and polyuria.  Genitourinary: Negative for difficulty urinating and urgency.       Incontinent   Musculoskeletal: Positive for gait problem. Negative for arthralgias, back pain, myalgias and neck pain.       Spastic   Skin: Negative for color change, pallor and rash.  Neurological: Positive for weakness. Negative for dizziness, seizures, speech difficulty, light-headedness and headaches.  Hematological: Does not bruise/bleed easily.  Psychiatric/Behavioral: Negative for agitation, behavioral problems, confusion and sleep disturbance. The patient is nervous/anxious.     Immunization History   Administered Date(s) Administered  . Influenza, High Dose Seasonal PF 07/01/2018  . Influenza,inj,Quad PF,6+ Mos 05/20/2013, 05/26/2014, 06/18/2015, 06/18/2016, 06/29/2017, 06/20/2019  . Influenza-Unspecified 06/20/2019  . Moderna SARS-COVID-2 Vaccination 08/19/2019, 10/08/2019  . PPD Test 08/23/2019  . Td 08/18/1998  . Tdap 03/11/2012   Pertinent  Health Maintenance Due  Topic Date Due  . PAP SMEAR-Modifier  Never done  . MAMMOGRAM  Never done  . COLONOSCOPY  Never done  . INFLUENZA VACCINE  03/18/2020   No flowsheet data found.   Vitals:   04/04/20 1637  BP: 122/76  Pulse: 74  Resp: 20  Temp: 98.2 F (36.8 C)  TempSrc: Oral  Weight: 161 lb 9.6 oz (73.3 kg)  Height: 5\' 5"  (1.651 m)   Body mass index is 26.89 kg/m. Physical Exam Vitals reviewed.  Constitutional:      General: She is not in acute distress.    Appearance: She is not ill-appearing.  HENT:     Head: Normocephalic.     Nose: Nose normal. No congestion or rhinorrhea.     Mouth/Throat:     Mouth: Mucous membranes  are moist.     Pharynx: Oropharynx is clear. No oropharyngeal exudate or posterior oropharyngeal erythema.  Eyes:     General: No scleral icterus.       Right eye: No discharge.        Left eye: No discharge.     Conjunctiva/sclera: Conjunctivae normal.     Pupils: Pupils are equal, round, and reactive to light.  Cardiovascular:     Rate and Rhythm: Normal rate and regular rhythm.     Pulses: Normal pulses.     Heart sounds: Normal heart sounds. No murmur heard.  No friction rub. No gallop.   Pulmonary:     Effort: Pulmonary effort is normal. No respiratory distress.     Breath sounds: Normal breath sounds. No wheezing, rhonchi or rales.  Chest:     Chest wall: No tenderness.  Abdominal:     General: Bowel sounds are normal. There is no distension.     Palpations: Abdomen is soft. There is no mass.     Tenderness: There is no abdominal tenderness. There is no right CVA tenderness,  left CVA tenderness, guarding or rebound.  Musculoskeletal:        General: No swelling or tenderness.     Cervical back: No rigidity or tenderness.     Right lower leg: No edema.     Left lower leg: No edema.     Comments: Paraplegic   Lymphadenopathy:     Cervical: No cervical adenopathy.  Skin:    General: Skin is warm and dry.     Coloration: Skin is not pale.     Findings: No bruising, erythema or rash.  Neurological:     Mental Status: She is alert and oriented to person, place, and time.     Motor: Weakness present.     Gait: Gait abnormal.  Psychiatric:        Mood and Affect: Mood is anxious. Mood is not depressed. Affect is tearful.        Speech: Speech normal.        Behavior: Behavior normal.        Thought Content: Thought content normal.        Judgment: Judgment normal.    Labs reviewed: Recent Labs    10/01/19 2151 10/10/19 0000 01/31/20 0000 02/08/20 0000 02/13/20 0000  NA 143   < > 143 138 138  K 3.4*   < > 4.1 3.3* 3.7  CL 106   < > 103 101 103  CO2 25   < > 24* 24* 19  GLUCOSE 116*  --   --   --   --   BUN 10   < > 9 9 8   CREATININE 0.88   < > 0.6 0.7 0.7  CALCIUM 9.4   < > 9.7 9.5 9.0   < > = values in this interval not displayed.   Recent Labs    10/01/19 2151 10/02/19 0000 10/31/19 0000 01/09/20 0000 02/08/20 0000  AST 18   < > 7* 12* 8*  ALT 15   < > 6* 10 3*  ALKPHOS 52   < > 52 58 56  BILITOT 0.7  --   --   --   --   PROT 7.1  --   --   --   --   ALBUMIN 3.7   < > 3.4* 3.1* 3.8   < > = values in this interval not displayed.   Recent Labs  10/01/19 2151 10/10/19 0000 11/03/19 0000 11/03/19 0000 11/10/19 0000 11/10/19 0000 11/21/19 0000 12/13/19 0000 01/09/20 0000 01/09/20 0000 01/21/20 0000 01/24/20 0000 02/08/20 0000  WBC 21.6*   < > 23.4   < > 14.6   < > 8.1   < > 10.5   < > 7.4 6.6 7.6  NEUTROABS 18.9*   < > 20  --  12  --  6  --   --   --   --   --   --   HGB 11.7*   < > 12.1   < > 10.7*   < > 11.0*   < > 10.2*   --   --  9.8* 11.4*  HCT 37.8   < > 38   < > 33*   < > 34*   < > 31*   < > 28* 31* 36  MCV 86.3  --   --   --   --   --   --   --   --   --   --   --   --   PLT 335   < > 335   < > 430*   < > 450*   < > 338   < > 288 412* 304   < > = values in this interval not displayed.    Significant Diagnostic Results in last 30 days:  No results found.  Assessment/Plan 1. Essential hypertension B/p at goal.not on any medication. - continue to monitor - TSH level   2. Panic attacks PRN Lorazepam effective. Tearful during visit talking about her ADL's care. Continue on Lorazepam and Prozac   3. Current severe episode of major depressive disorder without psychotic features without prior episode ([place]) Continue on Fluoxetine 40 mg capsule daily  Continue to monitor for mood changes  - TSH level   4. Recurrent Clostridium difficile diarrhea Diarrhea resolved.  5. 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 Continue on baclofen and Tizanidine    6. Hyperlipidemia LDL goal <100 No latest LDL for review. - continue on pravastatin 40 mg tablet daily  - Lipid panel   Family/ staff Communication: Reviewed plan of care with patient and facility Nurse   Labs/tests ordered:   - TSH level  - Lipid panel

## 2020-04-17 ENCOUNTER — Telehealth: Payer: Self-pay

## 2020-04-17 NOTE — Telephone Encounter (Signed)
Patient called office back returning missed call from MD. Will forward message to MD to call patient back. Patient would like to inform MD she has had diarrhea for two days now.

## 2020-04-18 NOTE — Telephone Encounter (Signed)
Patient called office to follow up on missed call with MD. Informed patient that appointment is scheduled for tomorrow at 2. Patient was not aware of appointment, but understands she is scheduled for a follow up. Would like to talk with MD before appointment.  Renee Huang

## 2020-04-19 ENCOUNTER — Telehealth (INDEPENDENT_AMBULATORY_CARE_PROVIDER_SITE_OTHER): Payer: Medicare Other | Admitting: Internal Medicine

## 2020-04-19 ENCOUNTER — Other Ambulatory Visit: Payer: Self-pay

## 2020-04-19 DIAGNOSIS — R197 Diarrhea, unspecified: Secondary | ICD-10-CM

## 2020-04-19 NOTE — Progress Notes (Signed)
Virtual Visit via Telephone Note  I connected with Maison Agrusa on 04/19/20 at  2:00 PM EDT by telephone and verified that I am speaking with the correct person using two identifiers.  Location: Patient: at SNF Provider: in clinic   I discussed the limitations, risks, security and privacy concerns of performing an evaluation and management service by telephone and the availability of in person appointments. I also discussed with the patient that there may be a patient responsible charge related to this service. The patient expressed understanding and agreed to proceed.   History of Present Illness: 3-4 loose stools with some vomiting today. However, they have had several residents with same symptoms. No abdominal pain. No fevers/chills    Observations/Objective: Afebrile Does not appear fatigue (per RN aide)  Assessment and Plan: Viral enteritis -- should be self-limited; continue to make sure to have good oral intake for GI loss cdifficile colitis -- follow current taper. If not improved by Monday, we may need to bump back up her tapering dose    Follow Up Instructions: Keep hydrated, avoid immodium Continue with tapering dose of oral vanco   I discussed the assessment and treatment plan with the patient. The patient was provided an opportunity to ask questions and all were answered. The patient agreed with the plan and demonstrated an understanding of the instructions.   The patient was advised to call back or seek an in-person evaluation if the symptoms worsen or if the condition fails to improve as anticipated.  I provided 10  minutes of non-face-to-face time during this encounter.   Carlyle Basques, MD

## 2020-04-24 ENCOUNTER — Telehealth: Payer: Self-pay

## 2020-04-24 NOTE — Telephone Encounter (Signed)
Patient left voicemail on triage stating MD requested she call office back today for results. States that this past weekend she was not doing well. States she had diarrhea all weekend.   Pt requesting call back from MD.  Aundria Rud, [place]

## 2020-04-24 NOTE — Telephone Encounter (Signed)
Patient called to report that she is still having diarrhea and doesn't not feel better. Stated that per Dr. Storm Frisk request she was to call back if she didn't feel she was improving. Reports that she's still throwing up as well.   Forwarding to provider for review and possible medication changes. Jadda Hunsucker Lorita Officer, RN

## 2020-04-25 ENCOUNTER — Non-Acute Institutional Stay (SKILLED_NURSING_FACILITY): Payer: Medicare Other | Admitting: Family

## 2020-04-25 ENCOUNTER — Encounter: Payer: Self-pay | Admitting: Family

## 2020-04-25 DIAGNOSIS — K117 Disturbances of salivary secretion: Secondary | ICD-10-CM

## 2020-04-25 DIAGNOSIS — R197 Diarrhea, unspecified: Secondary | ICD-10-CM

## 2020-04-25 DIAGNOSIS — Z789 Other specified health status: Secondary | ICD-10-CM | POA: Diagnosis not present

## 2020-04-25 NOTE — Progress Notes (Signed)
Location:    [facility]   [facility] Room Number: 210-W Place of Service:  SNF (31) Provider:  Marlowe Sax, NP    Patient Care Team: [name], DO as PCP - General (Geriatric Medicine) Rehab, [place] ([place]) [place], Nelda Bucks, NP as Nurse Practitioner (Family Medicine) Carlyle Basques, MD as Consulting Physician (Infectious Diseases) Ala Bent, MD as Referring Physician (Neurology)  Extended Emergency Contact Information Primary Emergency Contact: [name] Mobile Phone: [phone] Relation: Other Secondary Emergency Contact: [name] Mobile Phone: [phone] Relation: Other  Code Status:  Full Code  Goals of care: Advanced Directive information Advanced Directives 04/25/2020  Does Patient Have a Medical Advance Directive? No  Type of Advance Directive -  Does patient want to make changes to medical advance directive? No - Guardian declined  Pre-existing out of facility DNR order (yellow form or pink MOST form) -     Chief Complaint  Patient presents with  . Acute Visit    Dry Mouth.    HPI:  Pt is a 64 y.o. female seen today for an acute visit for evaluation of dry mouth and diarrhea.she states has had diarrhea for the past one week.Stool just " Gush out with some abdominal cramping". Cramping goes away after she has loose stool.states staff have noticed strong odor from stool.Also had an episode of nausea and vomiting on Sunday 04/22/2020 but none since then.she has had no appetite tolerating juice,soup,coffee and Yogurt.temperature was 99.5 yesterday but states none today but sometimes feels hot and burning up then gets cold.she has drinks ice water to stay hydrated.No blood in the stool reported. Her mouth has been dry and cracking on corners of the lips.wound care Nurse gave some facility stock A&D which seems to have worked better than Vaseline that she had at bedside.she has also used biotene  for inside the mouth in the past but did not relief symptoms.she prefers sugar free hard card or lozenges for dry mouth.    Past Medical History:  Diagnosis Date  . Actinic keratosis   . BCC (basal cell carcinoma of skin)   . Closed fracture of second metatarsal bone of right foot   . Closed fracture of third metatarsal bone of right foot with routine healing   . IBS (irritable bowel syndrome)   . Multiple sclerosis ([place])   . Neurogenic bladder   . Rectal polyp    Tubular adenoma  . Recurrent UTI   . SCC (squamous cell carcinoma)    Past Surgical History:  Procedure Laterality Date  . BOTOX INJECTION    . BREAST BIOPSY    . COLONOSCOPY    . SKIN CANCER EXCISION      No Known Allergies  Allergies as of 04/25/2020   No Known Allergies     Medication List       Accurate as of April 25, 2020  1:57 PM. If you have any questions, ask your nurse or doctor.        acetaminophen 325 MG tablet Commonly known as: TYLENOL Take 325 mg by mouth every 4 (four) hours as needed for mild pain, fever or headache.   baclofen 10 MG tablet Commonly known as: LIORESAL Take 10 mg by mouth 3 (three) times daily.   baclofen 20 MG tablet Commonly known as: LIORESAL Take 20 mg by mouth 3 (three) times daily.   cetirizine 5 MG tablet Commonly known as: ZYRTEC Take 1 tablet (5 mg total)  by mouth at bedtime.   Cholecalciferol 25 MCG (1000 UT) tablet Take 1,000 Units by mouth daily. Take along with a 5000 unit tablet to = 6000 units   Cholecalciferol 125 MCG (5000 UT) Tabs Take 1 tablet by mouth daily. Along with a 1000 unit to = 6000 units   Vitamin D (Cholecalciferol) 25 MCG (1000 UT) Tabs Take 1 tablet by mouth daily. Along with a 5000 unit to = 6000 units   Culturelle Caps Take by mouth. 10B Cell Capsule   CULTURELLE PROBIOTICS PO Take 1 capsule by mouth daily.   dicyclomine 20 MG tablet Commonly known as: BENTYL Take 20 mg by mouth in the morning, at noon, in the evening,  and at bedtime.   FLUoxetine 40 MG capsule Commonly known as: PROZAC Take 1 capsule (40 mg total) by mouth daily.   hydrocortisone cream 1 % Apply 1 application topically every 12 (twelve) hours as needed for itching.   LORazepam 0.5 MG tablet Commonly known as: ATIVAN Take 0.5 mg by mouth daily as needed (Panic attack).   NON FORMULARY Diet order: Regular NAS Diet.   potassium chloride 10 MEQ tablet Commonly known as: KLOR-CON Take 10 mEq by mouth daily.   pravastatin 40 MG tablet Commonly known as: PRAVACHOL Take 40 mg by mouth daily.   tamsulosin 0.4 MG Caps capsule Commonly known as: FLOMAX Take 0.4 mg by mouth daily.   tiZANidine 2 MG tablet Commonly known as: ZANAFLEX Take 2 mg by mouth at bedtime.   vitamin B-12 1000 MCG tablet Commonly known as: CYANOCOBALAMIN Take 1,000 mcg by mouth daily.   Zofran 4 MG tablet Generic drug: ondansetron Take 4 mg by mouth every 6 (six) hours as needed for nausea or vomiting.       Review of Systems  Constitutional: Positive for appetite change. Negative for chills, fatigue and fever.  HENT: Negative for congestion, sinus pressure, sinus pain, sneezing and sore throat.        Dry mouth   Respiratory: Negative for cough, chest tightness, shortness of breath and wheezing.   Cardiovascular: Negative for chest pain, palpitations and leg swelling.  Gastrointestinal: Positive for diarrhea. Negative for abdominal distention, abdominal pain, blood in stool, constipation and nausea.       X 1 episode of vomiting   Skin: Negative for color change, pallor and rash.  Neurological: Negative for dizziness, speech difficulty, light-headedness and headaches.  Psychiatric/Behavioral: Negative for agitation and sleep disturbance. The patient is nervous/anxious.     Immunization History  Administered Date(s) Administered  . Influenza, High Dose Seasonal PF 07/01/2018  . Influenza,inj,Quad PF,6+ Mos 05/20/2013, 05/26/2014, 06/18/2015,  06/18/2016, 06/29/2017, 06/20/2019  . Influenza-Unspecified 06/20/2019  . Moderna SARS-COVID-2 Vaccination 08/19/2019, 10/08/2019  . PPD Test 08/23/2019  . Td 08/18/1998  . Tdap 03/11/2012   Pertinent  Health Maintenance Due  Topic Date Due  . PAP SMEAR-Modifier  Never done  . MAMMOGRAM  Never done  . COLONOSCOPY  Never done  . INFLUENZA VACCINE  03/18/2020   Fall Risk  04/19/2020  Falls in the past year? 0  Risk for fall due to : No Fall Risks  Follow up Falls evaluation completed    Vitals:   04/25/20 1137  BP: 128/64  Pulse: 70  Resp: 18  Temp: 97.9 F (36.6 C)  Weight: 160 lb 14.4 oz (73 kg)  Height: 5\' 5"  (1.651 m)   Body mass index is 26.78 kg/m. Physical Exam Vitals and nursing note reviewed.  Constitutional:  General: She is not in acute distress.    Appearance: She is not ill-appearing.  HENT:     Nose: Nose normal. No congestion or rhinorrhea.     Mouth/Throat:     Mouth: Mucous membranes are moist.     Pharynx: Oropharynx is clear. No oropharyngeal exudate or posterior oropharyngeal erythema.  Eyes:     General: No scleral icterus.       Right eye: No discharge.        Left eye: No discharge.     Extraocular Movements: Extraocular movements intact.     Conjunctiva/sclera: Conjunctivae normal.     Pupils: Pupils are equal, round, and reactive to light.  Cardiovascular:     Rate and Rhythm: Normal rate and regular rhythm.     Pulses: Normal pulses.     Heart sounds: Normal heart sounds. No murmur heard.  No friction rub. No gallop.   Pulmonary:     Effort: Pulmonary effort is normal. No respiratory distress.     Breath sounds: Normal breath sounds. No wheezing, rhonchi or rales.  Chest:     Chest wall: No tenderness.  Abdominal:     General: Bowel sounds are normal. There is no distension.     Palpations: Abdomen is soft. There is no mass.     Tenderness: There is no abdominal tenderness. There is no right CVA tenderness, left CVA tenderness,  guarding or rebound.     Comments: Small amount of yellow stool stain noted on diaper   Musculoskeletal:        General: No swelling or tenderness.     Right lower leg: No edema.     Left lower leg: No edema.     Comments: Paraplegic   Skin:    General: Skin is warm.     Coloration: Skin is not pale.     Findings: No bruising, erythema or rash.  Neurological:     Mental Status: She is alert and oriented to person, place, and time.     Comments: Paraplegic  Psychiatric:        Mood and Affect: Affect is tearful.        Speech: Speech normal.        Behavior: Behavior normal.        Thought Content: Thought content normal.    Labs reviewed: Recent Labs    10/01/19 2151 10/10/19 0000 01/31/20 0000 02/08/20 0000 02/13/20 0000  NA 143   < > 143 138 138  K 3.4*   < > 4.1 3.3* 3.7  CL 106   < > 103 101 103  CO2 25   < > 24* 24* 19  GLUCOSE 116*  --   --   --   --   BUN 10   < > 9 9 8   CREATININE 0.88   < > 0.6 0.7 0.7  CALCIUM 9.4   < > 9.7 9.5 9.0   < > = values in this interval not displayed.   Recent Labs    10/01/19 2151 10/02/19 0000 10/31/19 0000 01/09/20 0000 02/08/20 0000  AST 18   < > 7* 12* 8*  ALT 15   < > 6* 10 3*  ALKPHOS 52   < > 52 58 56  BILITOT 0.7  --   --   --   --   PROT 7.1  --   --   --   --   ALBUMIN 3.7   < > 3.4* 3.1*  3.8   < > = values in this interval not displayed.   Recent Labs    10/01/19 2151 10/10/19 0000 11/03/19 0000 11/03/19 0000 11/10/19 0000 11/10/19 0000 11/21/19 0000 12/13/19 0000 01/09/20 0000 01/09/20 0000 01/21/20 0000 01/24/20 0000 02/08/20 0000  WBC 21.6*   < > 23.4   < > 14.6   < > 8.1   < > 10.5   < > 7.4 6.6 7.6  NEUTROABS 18.9*   < > 20  --  12  --  6  --   --   --   --   --   --   HGB 11.7*   < > 12.1   < > 10.7*   < > 11.0*   < > 10.2*  --   --  9.8* 11.4*  HCT 37.8   < > 38   < > 33*   < > 34*   < > 31*   < > 28* 31* 36  MCV 86.3  --   --   --   --   --   --   --   --   --   --   --   --   PLT 335    < > 335   < > 430*   < > 450*   < > 338   < > 288 412* 304   < > = values in this interval not displayed.    Significant Diagnostic Results in last 30 days:  No results found.  Assessment/Plan 1. Xerostomia - declined biotene.would like OTC Hard sugar free candy.  - continue with A&D ointment  - encouraged to increase fluid intake  2. Diarrhea, unspecified type Afebrile. - Hx of C-diff completed oral Vancomycin 03/2020  - will obtain stool specimen to rule out  C-diff - continue to follow up with ID  - CBC/diff,BMP 04/26/2020   3. Full Code Status   document in place for full code resuscitation in case of a cardiopulmonary event.   Family/ staff Communication: Reviewed plan of care with Patient and facility Nurse   Labs/tests ordered:  - CBC/diff,BMP 04/26/2020

## 2020-04-26 LAB — COMPREHENSIVE METABOLIC PANEL
Calcium: 8.8 (ref 8.7–10.7)
GFR calc Af Amer: >90
GFR calc non Af Amer: >90

## 2020-04-26 LAB — BASIC METABOLIC PANEL
BUN: 14 (ref 4–21)
CO2: 22 (ref 13–22)
Chloride: 101 (ref 99–108)
Creatinine: 0.7 (ref 0.5–1.1)
Glucose: 69
Potassium: 3.9 (ref 3.4–5.3)
Sodium: 138 (ref 137–147)

## 2020-04-26 LAB — CBC AND DIFFERENTIAL
HCT: 34 — AB (ref 36–46)
Hemoglobin: 10.9 — AB (ref 12.0–16.0)
Platelets: 367 (ref 150–399)
WBC: 12.3

## 2020-04-26 LAB — CBC: RBC: 4.42 (ref 3.87–5.11)

## 2020-04-26 NOTE — Telephone Encounter (Signed)
Can you call back to her and speak with RN to increase her oral vancomycin to QID x 10 days, then start taper back to 3 times per day for 1 wk, then bid for 1 wk, then we will see her in clinic

## 2020-04-27 NOTE — Telephone Encounter (Signed)
I spoke with provider since patient is having relapse. She is getting retreated. Can we have visit in 4 wk

## 2020-04-27 NOTE — Telephone Encounter (Signed)
RN called [facility], asked to speak with the nurse taking care of Ms. Renee Huang. Was transferred to voicemail for Treatment Nurse. Left voicemail with Dr Storm Frisk oral vancomycin taper instructions, asked them to call back with confirmation and to make follow up appointment for patient. Landis Gandy, RN

## 2020-04-30 ENCOUNTER — Encounter: Payer: Self-pay | Admitting: Family

## 2020-04-30 ENCOUNTER — Non-Acute Institutional Stay (SKILLED_NURSING_FACILITY): Payer: Medicare Other | Admitting: Family

## 2020-04-30 DIAGNOSIS — A0471 Enterocolitis due to Clostridium difficile, recurrent: Secondary | ICD-10-CM

## 2020-04-30 DIAGNOSIS — D72829 Elevated white blood cell count, unspecified: Secondary | ICD-10-CM | POA: Diagnosis not present

## 2020-04-30 LAB — CBC AND DIFFERENTIAL
HCT: 39 (ref 36–46)
Hemoglobin: 12.3 (ref 12.0–16.0)
Platelets: 405 — AB (ref 150–399)
WBC: 15.7

## 2020-04-30 LAB — CBC: RBC: 4.96 (ref 3.87–5.11)

## 2020-04-30 NOTE — Telephone Encounter (Signed)
RN called [facility] for follow up.  Per Frankey Poot, nurse on duty, patient started oral vancomycin taper 04/26/20.  She is scheduled to follow up with Dr Baxter Flattery 10/14. Landis Gandy, RN

## 2020-05-01 NOTE — Progress Notes (Signed)
Location:    [place].   Nursing Home Room Number: 210-W Place of Service:  SNF (31) Provider:  Marlowe Sax, NP  Patient Care Team: [name], DO as PCP - General (Geriatric Medicine) Rehab, [place] ([place]) [place], Nelda Bucks, NP as Nurse Practitioner (Family Medicine) Carlyle Basques, MD as Consulting Physician (Infectious Diseases) Ala Bent, MD as Referring Physician (Neurology)  Extended Emergency Contact Information Primary Emergency Contact: [name] Mobile Phone: [phone] Relation: Other Secondary Emergency Contact: [name] Mobile Phone: [phone] Relation: Other  Code Status:  Full Code  Goals of care: Advanced Directive information Advanced Directives 04/30/2020  Does Patient Have a Medical Advance Directive? No  Type of Advance Directive -  Does patient want to make changes to medical advance directive? No - Patient declined  Pre-existing out of facility DNR order (yellow form or pink MOST form) -     Chief Complaint  Patient presents with   Acute Visit    Abnormal Labs.    HPI:  Pt is a 64 y.o. female seen today for an acute visit for evaluation of abnormal lab results.she is seen in her room up in the bed watching TV.she denies any acute issues today.States diarrhea has improved.Appetite improving too.Her recent lab result indicates WBC 15.7 she has had no fever or chills.she denies any cough or cold symptoms.she is currently on Tapered Vancomycin for positive C-diff. She is incontinent for both bladder and bowel.     Past Medical History:  Diagnosis Date   Actinic keratosis    BCC (basal cell carcinoma of skin)    Closed fracture of second metatarsal bone of right foot    Closed fracture of third metatarsal bone of right foot with routine healing    IBS (irritable bowel syndrome)    Multiple sclerosis (HCC)    Neurogenic bladder    Rectal polyp    Tubular adenoma     Recurrent UTI    SCC (squamous cell carcinoma)    Past Surgical History:  Procedure Laterality Date   BOTOX INJECTION     BREAST BIOPSY     COLONOSCOPY     SKIN CANCER EXCISION      No Known Allergies  Allergies as of 04/30/2020   No Known Allergies     Medication List       Accurate as of April 30, 2020 11:59 PM. If you have any questions, ask your nurse or doctor.        acetaminophen 325 MG tablet Commonly known as: TYLENOL Take 325 mg by mouth every 4 (four) hours as needed for mild pain, fever or headache.   baclofen 10 MG tablet Commonly known as: LIORESAL Take 10 mg by mouth 3 (three) times daily.   baclofen 20 MG tablet Commonly known as: LIORESAL Take 20 mg by mouth 3 (three) times daily.   cetirizine 5 MG tablet Commonly known as: ZYRTEC Take 1 tablet (5 mg total) by mouth at bedtime.   Cholecalciferol 25 MCG (1000 UT) tablet Take 1,000 Units by mouth daily. Take along with a 5000 unit tablet to = 6000 units   Cholecalciferol 125 MCG (5000 UT) Tabs Take 1 tablet by mouth daily. Along with a 1000 unit to = 6000 units   Vitamin D (Cholecalciferol) 25 MCG (1000 UT) Tabs Take 1 tablet by mouth daily. Along with a 5000 unit to = 6000 units   Culturelle Caps Take by mouth. 10B Cell Capsule  CULTURELLE PROBIOTICS PO Take 1 capsule by mouth daily.   dicyclomine 20 MG tablet Commonly known as: BENTYL Take 20 mg by mouth in the morning, at noon, in the evening, and at bedtime.   FLUoxetine 40 MG capsule Commonly known as: PROZAC Take 1 capsule (40 mg total) by mouth daily.   hydrocortisone cream 1 % Apply 1 application topically every 12 (twelve) hours as needed for itching.   LORazepam 0.5 MG tablet Commonly known as: ATIVAN Take 0.5 mg by mouth daily as needed (Panic attack).   NON FORMULARY Diet order: Regular NAS Diet.   potassium chloride 10 MEQ tablet Commonly known as: KLOR-CON Take 10 mEq by mouth daily.   pravastatin  40 MG tablet Commonly known as: PRAVACHOL Take 40 mg by mouth daily.   tamsulosin 0.4 MG Caps capsule Commonly known as: FLOMAX Take 0.4 mg by mouth daily.   tiZANidine 2 MG tablet Commonly known as: ZANAFLEX Take 2 mg by mouth at bedtime.   vancomycin 250 MG capsule Commonly known as: VANCOCIN Take 250 mg by mouth every 6 (six) hours as needed. X 10 days.   vancomycin 250 MG capsule Commonly known as: VANCOCIN Take 250 mg by mouth once a week.   vancomycin 250 MG capsule Commonly known as: VANCOCIN Take 250 mg by mouth 2 (two) times daily. One week.   vitamin B-12 1000 MCG tablet Commonly known as: CYANOCOBALAMIN Take 1,000 mcg by mouth daily.   Zofran 4 MG tablet Generic drug: ondansetron Take 4 mg by mouth every 6 (six) hours as needed for nausea or vomiting.       Review of Systems  Constitutional: Negative for appetite change, chills, fatigue and fever.  HENT: Negative for congestion, rhinorrhea, sinus pressure, sinus pain, sneezing, sore throat and trouble swallowing.   Respiratory: Negative for cough, chest tightness, shortness of breath and wheezing.   Cardiovascular: Negative for chest pain, palpitations and leg swelling.  Gastrointestinal: Positive for diarrhea. Negative for abdominal distention, abdominal pain, nausea and vomiting.  Genitourinary: Negative for urgency.       Incontinent   Musculoskeletal: Positive for gait problem. Negative for joint swelling and myalgias.  Skin: Negative for color change, pallor and rash.  Neurological: Negative for dizziness, speech difficulty, light-headedness and headaches.  Psychiatric/Behavioral: Negative for agitation, behavioral problems and sleep disturbance. The patient is nervous/anxious.     Immunization History  Administered Date(s) Administered   Influenza, High Dose Seasonal PF 07/01/2018   Influenza,inj,Quad PF,6+ Mos 05/20/2013, 05/26/2014, 06/18/2015, 06/18/2016, 06/29/2017, 06/20/2019    Influenza-Unspecified 06/20/2019   Moderna SARS-COVID-2 Vaccination 08/19/2019, 10/08/2019   PPD Test 08/23/2019   Td 08/18/1998   Tdap 03/11/2012   Pertinent  Health Maintenance Due  Topic Date Due   PAP SMEAR-Modifier  Never done   MAMMOGRAM  Never done   COLONOSCOPY  Never done   INFLUENZA VACCINE  03/18/2020   Fall Risk  04/19/2020  Falls in the past year? 0  Risk for fall due to : No Fall Risks  Follow up Falls evaluation completed    Vitals:   04/30/20 1654  BP: 125/73  Pulse: 89  Resp: 19  Temp: 98.2 F (36.8 C)  Weight: 160 lb 14.4 oz (73 kg)  Height: 5\' 5"  (1.651 m)   Body mass index is 26.78 kg/m. Physical Exam Vitals and nursing note reviewed.  Constitutional:      General: She is not in acute distress.    Appearance: She is overweight. She is not ill-appearing.  HENT:     Head: Normocephalic.     Mouth/Throat:     Mouth: Mucous membranes are moist.     Pharynx: Oropharynx is clear. No oropharyngeal exudate or posterior oropharyngeal erythema.  Eyes:     General: No scleral icterus.       Right eye: No discharge.        Left eye: No discharge.     Conjunctiva/sclera: Conjunctivae normal.     Pupils: Pupils are equal, round, and reactive to light.  Cardiovascular:     Rate and Rhythm: Normal rate and regular rhythm.     Pulses: Normal pulses.     Heart sounds: Normal heart sounds. No murmur heard.  No friction rub. No gallop.   Pulmonary:     Effort: Pulmonary effort is normal. No respiratory distress.     Breath sounds: Normal breath sounds. No wheezing, rhonchi or rales.  Chest:     Chest wall: No tenderness.  Abdominal:     General: Bowel sounds are normal. There is no distension.     Palpations: Abdomen is soft. There is no mass.     Tenderness: There is no abdominal tenderness. There is no guarding or rebound.  Musculoskeletal:        General: No swelling or tenderness.     Right lower leg: No edema.     Left lower leg: No edema.    Skin:    General: Skin is warm and dry.     Coloration: Skin is not pale.     Findings: No bruising, erythema or rash.  Neurological:     Mental Status: She is alert and oriented to person, place, and time.     Comments: Paraplegic   Psychiatric:        Mood and Affect: Mood is anxious.        Speech: Speech normal.        Thought Content: Thought content normal.     Comments: Easily agitated     Labs reviewed: Recent Labs    10/01/19 2151 10/10/19 0000 02/08/20 0000 02/13/20 0000 04/26/20 0000  NA 143   < > 138 138 138  K 3.4*   < > 3.3* 3.7 3.9  CL 106   < > 101 103 101  CO2 25   < > 24* 19 22  GLUCOSE 116*  --   --   --   --   BUN 10   < > 9 8 14   CREATININE 0.88   < > 0.7 0.7 0.7  CALCIUM 9.4   < > 9.5 9.0 8.8   < > = values in this interval not displayed.   Recent Labs    10/01/19 2151 10/02/19 0000 10/31/19 0000 01/09/20 0000 02/08/20 0000  AST 18   < > 7* 12* 8*  ALT 15   < > 6* 10 3*  ALKPHOS 52   < > 52 58 56  BILITOT 0.7  --   --   --   --   PROT 7.1  --   --   --   --   ALBUMIN 3.7   < > 3.4* 3.1* 3.8   < > = values in this interval not displayed.   Recent Labs    10/01/19 2151 10/10/19 0000 11/03/19 0000 11/03/19 0000 11/10/19 0000 11/10/19 0000 11/21/19 0000 12/13/19 0000 02/08/20 0000 04/26/20 0000 04/30/20 0000  WBC 21.6*   < > 23.4   < > 14.6   < >  8.1   < > 7.6 12.3 15.7  NEUTROABS 18.9*   < > 20  --  12  --  6  --   --   --   --   HGB 11.7*   < > 12.1   < > 10.7*   < > 11.0*   < > 11.4* 10.9* 12.3  HCT 37.8   < > 38   < > 33*   < > 34*   < > 36 34* 39  MCV 86.3  --   --   --   --   --   --   --   --   --   --   PLT 335   < > 335   < > 430*   < > 450*   < > 304 367 405*   < > = values in this interval not displayed.    Significant Diagnostic Results in last 30 days:  No results found.  Assessment/Plan  1. Leukocytosis, unspecified type Afebrile.WBC 15.7 previous 12.3 suspect due to C-diff  No cough reported.Bilateral Lung  CTA  - obtain urine for U/A and C/S rule out UTI  - repeat CBC/diff 05/04/2020  - Notify provider if running any fever or chills.  2. Recurrent Clostridium difficile diarrhea - diarrhea has improved.continue on contact precaution - Continue on tapered vancomycin.  - encouraged hydration  Family/ staff Communication: Reviewed plan of care with patient and facility Nurse   Labs/tests ordered: CBC/diff 05/04/2020

## 2020-05-03 ENCOUNTER — Encounter: Payer: Self-pay | Admitting: Family

## 2020-05-03 ENCOUNTER — Non-Acute Institutional Stay (SKILLED_NURSING_FACILITY): Payer: Medicare Other | Admitting: Family

## 2020-05-03 DIAGNOSIS — G35 Multiple sclerosis: Secondary | ICD-10-CM | POA: Diagnosis not present

## 2020-05-03 DIAGNOSIS — N3001 Acute cystitis with hematuria: Secondary | ICD-10-CM | POA: Diagnosis not present

## 2020-05-03 NOTE — Progress Notes (Signed)
Location:    [place].   Nursing Home Room Number: 210-W Place of Service:  SNF (31) Provider:  Marlowe Sax, NP    Patient Care Team: [name], DO as PCP - General (Geriatric Medicine) Rehab, [place] ([place] [place], Nelda Bucks, NP as Nurse Practitioner (Family Medicine) Carlyle Basques, MD as Consulting Physician (Infectious Diseases) Ala Bent, MD as Referring Physician (Neurology)  Extended Emergency Contact Information Primary Emergency Contact: [name] Mobile Phone: [phone] Relation: Other Secondary Emergency Contact: [name] Mobile Phone: [phone] Relation: Other  Code Status:  FULL CODE Goals of care: Advanced Directive information Advanced Directives 05/03/2020  Does Patient Have a Medical Advance Directive? No  Type of Advance Directive -  Does patient want to make changes to medical advance directive? No - Patient declined  Pre-existing out of facility DNR order (yellow form or pink MOST form) -     Chief Complaint  Patient presents with  . Acute Visit    Abnormal labs    HPI:  Pt is a 64 y.o. female seen today for an acute visit for abnormal lab results.she is seen in her room watching TV in bed.she states diarrhea has reduced.currently on Vancomycin for C-diff infection.Her recent WBC was elevated 15.7 she denied any cough.Urine specimen ordered.results received today indicates yellow colored urine slightly cloudy with 3 + Leukocytes,positive Nitrite,Blood 3 + ,trace protein and cast present.she denies any fever,chills,lower abdominal  or back pain.she is incontinent. Her urine culture showed less than 50,000 mixed flora with no prominent microorganism present recollection recommended due to possible contamination. Patient agrees with plan to recollect urine for Culture and sensitivity.  Facility administrator reported 05/02/2020 patient request Hospice referral for multiple  sclerosis.Discussed with patient states would like Hospice referral.States spoke with Her Neurologist concerning Hospice referral already.    Past Medical History:  Diagnosis Date  . Actinic keratosis   . BCC (basal cell carcinoma of skin)   . Closed fracture of second metatarsal bone of right foot   . Closed fracture of third metatarsal bone of right foot with routine healing   . IBS (irritable bowel syndrome)   . Multiple sclerosis ([place])   . Neurogenic bladder   . Rectal polyp    Tubular adenoma  . Recurrent UTI   . SCC (squamous cell carcinoma)    Past Surgical History:  Procedure Laterality Date  . BOTOX INJECTION    . BREAST BIOPSY    . COLONOSCOPY    . SKIN CANCER EXCISION      No Known Allergies  Allergies as of 05/03/2020   No Known Allergies     Medication List       Accurate as of May 03, 2020 11:25 AM. If you have any questions, ask your nurse or doctor.        STOP taking these medications   NON FORMULARY Stopped by: Sandrea Hughs, NP     TAKE these medications   acetaminophen 325 MG tablet Commonly known as: TYLENOL Take 325 mg by mouth every 4 (four) hours as needed for mild pain, fever or headache.   baclofen 10 MG tablet Commonly known as: LIORESAL Take 10 mg by mouth 3 (three) times daily.   baclofen 20 MG tablet Commonly known as: LIORESAL Take 20 mg by mouth 3 (three) times daily.   cetirizine 5 MG tablet Commonly known as: ZYRTEC Take 1 tablet (5 mg total) by mouth at bedtime.  Culturelle Caps Take by mouth. 10B Cell Capsule   dicyclomine 20 MG tablet Commonly known as: BENTYL Take 20 mg by mouth in the morning, at noon, in the evening, and at bedtime.   FLUoxetine 40 MG capsule Commonly known as: PROZAC Take 1 capsule (40 mg total) by mouth daily.   hydrocortisone cream 1 % Apply 1 application topically every 12 (twelve) hours as needed for itching.   LORazepam 0.5 MG tablet Commonly known as: ATIVAN Take 0.5 mg  by mouth daily as needed (Panic attack).   potassium chloride 10 MEQ tablet Commonly known as: KLOR-CON Take 10 mEq by mouth daily.   pravastatin 40 MG tablet Commonly known as: PRAVACHOL Take 40 mg by mouth daily.   tamsulosin 0.4 MG Caps capsule Commonly known as: FLOMAX Take 0.4 mg by mouth daily.   tiZANidine 2 MG tablet Commonly known as: ZANAFLEX Take 2 mg by mouth at bedtime.   vancomycin 250 MG capsule Commonly known as: VANCOCIN Take 250 mg by mouth every 6 (six) hours as needed. X 10 days.   vancomycin 250 MG capsule Commonly known as: VANCOCIN Take 250 mg by mouth once a week.   vancomycin 250 MG capsule Commonly known as: VANCOCIN Take 250 mg by mouth 2 (two) times daily. One week.   vitamin B-12 1000 MCG tablet Commonly known as: CYANOCOBALAMIN Take 1,000 mcg by mouth daily.   Cholecalciferol 25 MCG (1000 UT) tablet Take 1,000 Units by mouth daily. Take along with a 5000 unit tablet to = 6000 units   Vitamin D (Cholecalciferol) 25 MCG (1000 UT) Tabs Take 1 tablet by mouth daily. Along with a 5000 unit to = 6000 units   Zofran 4 MG tablet Generic drug: ondansetron Take 4 mg by mouth every 6 (six) hours as needed for nausea or vomiting.       Review of Systems  Constitutional: Negative for appetite change, chills, fatigue and fever.  HENT: Negative for congestion, rhinorrhea, sinus pressure, sinus pain, sneezing and sore throat.   Respiratory: Negative for cough, chest tightness, shortness of breath and wheezing.   Cardiovascular: Negative for chest pain, palpitations and leg swelling.  Gastrointestinal: Positive for diarrhea. Negative for abdominal distention, abdominal pain, constipation, nausea and vomiting.  Genitourinary:       Incontinent   Musculoskeletal: Positive for gait problem. Negative for joint swelling and myalgias.  Skin: Negative for color change, pallor and rash.  Neurological: Negative for dizziness, light-headedness and  headaches.       Paraplegic  Psychiatric/Behavioral: Negative for agitation, confusion, hallucinations and sleep disturbance. The patient is nervous/anxious.     Immunization History  Administered Date(s) Administered  . Influenza, High Dose Seasonal PF 07/01/2018  . Influenza,inj,Quad PF,6+ Mos 05/20/2013, 05/26/2014, 06/18/2015, 06/18/2016, 06/29/2017, 06/20/2019  . Influenza-Unspecified 06/20/2019  . Moderna SARS-COVID-2 Vaccination 08/19/2019, 10/08/2019  . PPD Test 08/23/2019  . Td 08/18/1998  . Tdap 03/11/2012   Pertinent  Health Maintenance Due  Topic Date Due  . PAP SMEAR-Modifier  Never done  . MAMMOGRAM  Never done  . COLONOSCOPY  Never done  . INFLUENZA VACCINE  03/18/2020   Fall Risk  04/19/2020  Falls in the past year? 0  Risk for fall due to : No Fall Risks  Follow up Falls evaluation completed    Vitals:   05/03/20 1119  BP: 140/61  Pulse: 76  Resp: 16  Temp: (!) 97.1 F (36.2 C)  Weight: 160 lb 14.4 oz (73 kg)  Height: 5\' 5"  (  1.651 m)   Body mass index is 26.78 kg/m. Physical Exam  Labs reviewed: Recent Labs    10/01/19 2151 10/10/19 0000 02/08/20 0000 02/13/20 0000 04/26/20 0000  NA 143   < > 138 138 138  K 3.4*   < > 3.3* 3.7 3.9  CL 106   < > 101 103 101  CO2 25   < > 24* 19 22  GLUCOSE 116*  --   --   --   --   BUN 10   < > 9 8 14   CREATININE 0.88   < > 0.7 0.7 0.7  CALCIUM 9.4   < > 9.5 9.0 8.8   < > = values in this interval not displayed.   Recent Labs    10/01/19 2151 10/02/19 0000 10/31/19 0000 01/09/20 0000 02/08/20 0000  AST 18   < > 7* 12* 8*  ALT 15   < > 6* 10 3*  ALKPHOS 52   < > 52 58 56  BILITOT 0.7  --   --   --   --   PROT 7.1  --   --   --   --   ALBUMIN 3.7   < > 3.4* 3.1* 3.8   < > = values in this interval not displayed.   Recent Labs    10/01/19 2151 10/10/19 0000 11/03/19 0000 11/03/19 0000 11/10/19 0000 11/10/19 0000 11/21/19 0000 12/13/19 0000 02/08/20 0000 04/26/20 0000 04/30/20 0000  WBC  21.6*   < > 23.4   < > 14.6   < > 8.1   < > 7.6 12.3 15.7  NEUTROABS 18.9*   < > 20  --  12  --  6  --   --   --   --   HGB 11.7*   < > 12.1   < > 10.7*   < > 11.0*   < > 11.4* 10.9* 12.3  HCT 37.8   < > 38   < > 33*   < > 34*   < > 36 34* 39  MCV 86.3  --   --   --   --   --   --   --   --   --   --   PLT 335   < > 335   < > 430*   < > 450*   < > 304 367 405*   < > = values in this interval not displayed.   No results found for: TSH No results found for: HGBA1C No results found for: CHOL, HDL, LDLCALC, LDLDIRECT, TRIG, CHOLHDL  Significant Diagnostic Results in last 30 days:  No results found.  Assessment/Plan 1. Acute cystitis with hematuria Afebrile. U/A positive for nitrites,RBC and leukocytes but cultures suggestive for contamination. - encouraged to continue to increased fluid intake  - Agrees with plan to recollect clean catch urine specimen for culture and sensitivity.order written and given to facility Nurse  - continue to monitor vital signs.  2. Multiple sclerosis, primary progressive [facility]) Request referral to hospice service - Hospice Consult order was written  - continue to follow up with Neurologist   Family/ staff Communication:Reviewed plan of care with patient and facility Nurse   Labs/tests ordered: Urine culture

## 2020-05-04 LAB — CBC: RBC: 4.39 (ref 3.87–5.11)

## 2020-05-04 LAB — CBC AND DIFFERENTIAL
HCT: 34 — AB (ref 36–46)
Hemoglobin: 10.9 — AB (ref 12.0–16.0)
Platelets: 471 — AB (ref 150–399)
WBC: 9.6

## 2020-05-07 ENCOUNTER — Telehealth: Payer: Self-pay | Admitting: Internal Medicine

## 2020-05-07 NOTE — Telephone Encounter (Signed)
Noted.Asymptomatic.

## 2020-05-07 NOTE — Telephone Encounter (Signed)
When NP saw pt 9/16, UA had been positive then, as well, and repeat sample ordered then so this is the repeat.  Nurse on phone was unclear if pt actually had ongoing symptoms.  Has C diff on treatment.  Advised that if she remains symptomatic (notes from 9/16 say she had dysuria and hematuria), would do I/O cath sample after thorough pericare since clean catch virtually impossible if resident is incontinent of stool.  If now asymptomatic, suspect colonization or contamination so would not recheck.    Dionna Wiedemann L. Kimm Sider, D.O. [facility] [address], [place] [address] Cell Phone (Mon-Fri 8am-5pm):  [phone] On Call:  [phone] & follow prompts after 5pm & weekends Office Phone:  [phone] Office Fax:  [phone]

## 2020-05-23 LAB — LIPID PANEL
Cholesterol: 163 (ref 0–200)
HDL: 51 (ref 35–70)
LDL Cholesterol: 50
Triglycerides: 309 — AB (ref 40–160)

## 2020-05-24 ENCOUNTER — Encounter: Payer: Self-pay | Admitting: Family

## 2020-05-24 ENCOUNTER — Non-Acute Institutional Stay (SKILLED_NURSING_FACILITY): Payer: Medicare Other | Admitting: Family

## 2020-05-24 DIAGNOSIS — E785 Hyperlipidemia, unspecified: Secondary | ICD-10-CM

## 2020-05-24 DIAGNOSIS — E781 Pure hyperglyceridemia: Secondary | ICD-10-CM | POA: Diagnosis not present

## 2020-05-24 DIAGNOSIS — I10 Essential (primary) hypertension: Secondary | ICD-10-CM | POA: Diagnosis not present

## 2020-05-24 DIAGNOSIS — F322 Major depressive disorder, single episode, severe without psychotic features: Secondary | ICD-10-CM | POA: Diagnosis not present

## 2020-05-24 DIAGNOSIS — L89152 Pressure ulcer of sacral region, stage 2: Secondary | ICD-10-CM | POA: Diagnosis not present

## 2020-05-24 NOTE — Progress Notes (Signed)
Location:    [place].   Nursing Home Room Number: 210-W Place of Service:  SNF (31) Provider:  Marlowe Sax, NP    Patient Care Team: [name], DO as PCP - General (Geriatric Medicine) Rehab, [place] [facility] [place], Nelda Bucks, NP as Nurse Practitioner (Family Medicine) Carlyle Basques, MD as Consulting Physician (Infectious Diseases) Ala Bent, MD as Referring Physician (Neurology)  Extended Emergency Contact Information Primary Emergency Contact: [name] Mobile Phone: [phone] Relation: Other Secondary Emergency Contact: [name] Mobile Phone: [phone] Relation: Other  Code Status:  FULL CODE Goals of care: Advanced Directive information Advanced Directives 05/24/2020  Does Patient Have a Medical Advance Directive? No  Type of Advance Directive -  Does patient want to make changes to medical advance directive? No - Patient declined  Pre-existing out of facility DNR order (yellow form or pink MOST form) -     Chief Complaint  Patient presents with  . Medical Management of Chronic Issues    Routine Visit.   Marland Kitchen Health Maintenance    Discuss the need for Hepatitis C Screening, HIV Screening, Mammogram, Pap Smear, and Colonoscopy.  . Immunizations    Discuss the need for Influenza Vaccine.    HPI:  Pt is a 64 y.o. female seen today for medical management of chronic diseases. She has a medical history of Multiple sclerosis,IBS,Neurogenic bladder, Hyperlipidemia,Hypertension,recurrent C-diff among other conditions.she is seen in bed watching TV during visit.Her recent lab results reviewed and discussed with patient.Total chol 163,TRG 309,LDL 50 dietary intake discussed with facility Nutritionist patient appetite slowly improving post C-diff infection.Eating noodles and likes yorgut.Unable to limit diet for now.States was on 4 Gm of Omega- 3 fatty acid in the past would like to restart O-Mega 3.    Her weight has been stable for the past one month 160.2 lbs ( 04/24/2020 ) and 160.9 lbs ( 03/26/2020).previously had weight loss due to C-diff diarrhea.  She also request referral to a dentist,Opthalomology and reschedule her mammogram which states she had cancelled during COVID-19 restrictions.  Influenza vaccine will be administered at the facility if desired.willl defer hep c and HIV screening lab work until next lab draw she had lab work recently.    Past Medical History:  Diagnosis Date  . Actinic keratosis   . BCC (basal cell carcinoma of skin)   . Closed fracture of second metatarsal bone of right foot   . Closed fracture of third metatarsal bone of right foot with routine healing   . IBS (irritable bowel syndrome)   . Multiple sclerosis ([place])   . Neurogenic bladder   . Rectal polyp    Tubular adenoma  . Recurrent UTI   . SCC (squamous cell carcinoma)    Past Surgical History:  Procedure Laterality Date  . BOTOX INJECTION    . BREAST BIOPSY    . COLONOSCOPY    . SKIN CANCER EXCISION      No Known Allergies  Allergies as of 05/24/2020   No Known Allergies     Medication List       Accurate as of May 24, 2020  4:14 PM. If you have any questions, ask your nurse or doctor.        STOP taking these medications   vancomycin 250 MG capsule Commonly known as: VANCOCIN Stopped by: Sandrea Hughs, NP     TAKE these medications   acetaminophen 325 MG tablet Commonly known as: TYLENOL Take  325 mg by mouth every 4 (four) hours as needed for mild pain, fever or headache.   baclofen 10 MG tablet Commonly known as: LIORESAL Take 10 mg by mouth 3 (three) times daily.   baclofen 20 MG tablet Commonly known as: LIORESAL Take 20 mg by mouth 3 (three) times daily.   cetirizine 5 MG tablet Commonly known as: ZYRTEC Take 1 tablet (5 mg total) by mouth at bedtime.   Culturelle Caps Take by mouth. 10B Cell Capsule   dicyclomine 20 MG tablet Commonly known as:  BENTYL Take 20 mg by mouth in the morning, at noon, in the evening, and at bedtime.   FLUoxetine 40 MG capsule Commonly known as: PROZAC Take 1 capsule (40 mg total) by mouth daily.   hydrocortisone cream 1 % Apply 1 application topically every 12 (twelve) hours as needed for itching.   LORazepam 0.5 MG tablet Commonly known as: ATIVAN Take 0.5 mg by mouth daily as needed (Panic attack).   potassium chloride 10 MEQ tablet Commonly known as: KLOR-CON Take 10 mEq by mouth daily.   pravastatin 40 MG tablet Commonly known as: PRAVACHOL Take 40 mg by mouth daily.   tamsulosin 0.4 MG Caps capsule Commonly known as: FLOMAX Take 0.4 mg by mouth daily.   tiZANidine 2 MG tablet Commonly known as: ZANAFLEX Take 2 mg by mouth at bedtime.   vitamin B-12 1000 MCG tablet Commonly known as: CYANOCOBALAMIN Take 1,000 mcg by mouth daily.   Cholecalciferol 25 MCG (1000 UT) tablet Take 1,000 Units by mouth daily. Take along with a 5000 unit tablet to = 6000 units   Vitamin D (Cholecalciferol) 25 MCG (1000 UT) Tabs Take 5,000 Units by mouth daily. Along with a 1000 unit to = 6000 units   Zofran 4 MG tablet Generic drug: ondansetron Take 4 mg by mouth every 6 (six) hours as needed for nausea or vomiting.       Review of Systems  Constitutional: Negative for appetite change, chills, fatigue, fever and unexpected weight change.  HENT: Negative for congestion, rhinorrhea, sinus pressure, sinus pain, sneezing and sore throat.   Eyes: Negative for discharge, redness and itching.  Respiratory: Negative for cough, chest tightness, shortness of breath and wheezing.   Cardiovascular: Negative for chest pain, palpitations and leg swelling.  Gastrointestinal: Negative for abdominal distention, abdominal pain, constipation and vomiting.       Nausea has improved   Endocrine: Negative for cold intolerance, heat intolerance, polydipsia, polyphagia and polyuria.  Genitourinary: Negative for  difficulty urinating, dysuria, flank pain and urgency.       Incontinent   Musculoskeletal: Positive for gait problem. Negative for joint swelling and myalgias.  Skin: Negative for color change, pallor and rash.  Neurological: Positive for weakness and numbness. Negative for dizziness, speech difficulty, light-headedness and headaches.  Hematological: Does not bruise/bleed easily.  Psychiatric/Behavioral: Negative for agitation, behavioral problems and sleep disturbance. The patient is nervous/anxious.     Immunization History  Administered Date(s) Administered  . Influenza, High Dose Seasonal PF 07/01/2018  . Influenza,inj,Quad PF,6+ Mos 05/20/2013, 05/26/2014, 06/18/2015, 06/18/2016, 06/29/2017, 06/20/2019  . Influenza-Unspecified 06/20/2019  . Moderna SARS-COVID-2 Vaccination 08/19/2019, 10/08/2019  . PPD Test 08/23/2019  . Td 08/18/1998  . Tdap 03/11/2012   Pertinent  Health Maintenance Due  Topic Date Due  . PAP SMEAR-Modifier  Never done  . MAMMOGRAM  Never done  . COLONOSCOPY  Never done  . INFLUENZA VACCINE  03/18/2020   Fall Risk  04/19/2020  Falls  in the past year? 0  Risk for fall due to : No Fall Risks  Follow up Falls evaluation completed   Functional Status Survey:    Vitals:   05/24/20 1549  BP: 116/77  Pulse: 90  Resp: 18  Temp: 98.4 F (36.9 C)  Weight: 160 lb 3.2 oz (72.7 kg)  Height: 5\' 5"  (1.651 m)   Body mass index is 26.66 kg/m. Physical Exam Vitals and nursing note reviewed.  Constitutional:      General: She is not in acute distress.    Appearance: She is overweight. She is not ill-appearing.  HENT:     Head: Normocephalic.     Nose: Nose normal. No congestion or rhinorrhea.     Mouth/Throat:     Mouth: Mucous membranes are moist.     Pharynx: Oropharynx is clear. No oropharyngeal exudate or posterior oropharyngeal erythema.  Eyes:     General: No scleral icterus.       Right eye: No discharge.        Left eye: No discharge.      Conjunctiva/sclera: Conjunctivae normal.     Pupils: Pupils are equal, round, and reactive to light.  Cardiovascular:     Rate and Rhythm: Normal rate and regular rhythm.     Pulses: Normal pulses.     Heart sounds: Normal heart sounds. No murmur heard.  No friction rub. No gallop.   Pulmonary:     Effort: Pulmonary effort is normal. No respiratory distress.     Breath sounds: Normal breath sounds. No wheezing, rhonchi or rales.  Chest:     Chest wall: No tenderness.  Abdominal:     General: Bowel sounds are normal. There is no distension.     Palpations: Abdomen is soft. There is no mass.     Tenderness: There is no abdominal tenderness. There is no right CVA tenderness, left CVA tenderness, guarding or rebound.  Musculoskeletal:        General: No swelling or tenderness.     Cervical back: Normal range of motion. No rigidity or tenderness.     Right lower leg: No edema.     Left lower leg: No edema.     Comments: Paraplegic   Lymphadenopathy:     Cervical: No cervical adenopathy.  Skin:    General: Skin is warm and dry.     Coloration: Skin is not pale.     Findings: No bruising, erythema or rash.  Neurological:     Mental Status: She is alert and oriented to person, place, and time.     Sensory: Sensory deficit present.     Motor: Weakness present.     Coordination: Coordination normal.     Gait: Gait abnormal.  Psychiatric:        Mood and Affect: Mood is anxious.        Behavior: Behavior normal.        Thought Content: Thought content normal.        Judgment: Judgment normal.     Labs reviewed: Recent Labs    10/01/19 2151 10/10/19 0000 02/08/20 0000 02/13/20 0000 04/26/20 0000  NA 143   < > 138 138 138  K 3.4*   < > 3.3* 3.7 3.9  CL 106   < > 101 103 101  CO2 25   < > 24* 19 22  GLUCOSE 116*  --   --   --   --   BUN 10   < >  9 8 14   CREATININE 0.88   < > 0.7 0.7 0.7  CALCIUM 9.4   < > 9.5 9.0 8.8   < > = values in this interval not displayed.    Recent Labs    10/01/19 2151 10/02/19 0000 10/31/19 0000 01/09/20 0000 02/08/20 0000  AST 18   < > 7* 12* 8*  ALT 15   < > 6* 10 3*  ALKPHOS 52   < > 52 58 56  BILITOT 0.7  --   --   --   --   PROT 7.1  --   --   --   --   ALBUMIN 3.7   < > 3.4* 3.1* 3.8   < > = values in this interval not displayed.   Recent Labs    10/01/19 2151 10/10/19 0000 11/03/19 0000 11/03/19 0000 11/10/19 0000 11/10/19 0000 11/21/19 0000 12/13/19 0000 04/26/20 0000 04/30/20 0000 05/04/20 0000  WBC 21.6*   < > 23.4   < > 14.6   < > 8.1   < > 12.3 15.7 9.6  NEUTROABS 18.9*   < > 20  --  12  --  6  --   --   --   --   HGB 11.7*   < > 12.1   < > 10.7*   < > 11.0*   < > 10.9* 12.3 10.9*  HCT 37.8   < > 38   < > 33*   < > 34*   < > 34* 39 34*  MCV 86.3  --   --   --   --   --   --   --   --   --   --   PLT 335   < > 335   < > 430*   < > 450*   < > 367 405* 471*   < > = values in this interval not displayed.   No results found for: TSH No results found for: HGBA1C Lab Results  Component Value Date   CHOL 163 05/23/2020   HDL 51 05/23/2020   LDLCALC 50 05/23/2020   TRIG 309 (A) 05/23/2020    Significant Diagnostic Results in last 30 days:  No results found.  Assessment/Plan 1. Pressure ulcer of sacral region, stage 2 (HCC) Afebrile. Left gluteal /sacral area pressure ulcer wound bed red with surrounding 2 cm non-blanchable skin redness.No drainage noted. Facility wound care Nurse notified  For wound care.Encouraged patient to lie on her sides tends to stay on her back most of the time.   2. Hypertriglyceridemia TRG 309  Start on Omega -3 Fatty Acid 2 gm Capsule one by mouth twice daily. - lipid panel in 3 months   3. Current severe episode of major depressive disorder without psychotic features without prior episode ([place]) Mood liable cries at times. Continue on Prozac 40 mg capsule daily and Ativan as needed.    4. Essential hypertension B/p well controlled. Continue to  monitor  5. Hyperlipidemia LDL goal <100 LDL at goal Continue on Pravachol 40 mg tablet daily  - lipid panel in 3 months.   Family/ staff Communication: Reviewed plan of care with patient and facility Nurse   Labs/tests ordered: - lipid panel in 3 months.

## 2020-05-25 ENCOUNTER — Other Ambulatory Visit: Payer: Self-pay | Admitting: Internal Medicine

## 2020-05-28 ENCOUNTER — Other Ambulatory Visit: Payer: Self-pay | Admitting: Internal Medicine

## 2020-05-28 DIAGNOSIS — Z1231 Encounter for screening mammogram for malignant neoplasm of breast: Secondary | ICD-10-CM

## 2020-05-30 ENCOUNTER — Encounter: Payer: Self-pay | Admitting: Family

## 2020-05-30 ENCOUNTER — Non-Acute Institutional Stay (SKILLED_NURSING_FACILITY): Payer: Medicare Other | Admitting: Family

## 2020-05-30 DIAGNOSIS — R112 Nausea with vomiting, unspecified: Secondary | ICD-10-CM | POA: Diagnosis not present

## 2020-05-30 DIAGNOSIS — E781 Pure hyperglyceridemia: Secondary | ICD-10-CM | POA: Diagnosis not present

## 2020-05-30 NOTE — Progress Notes (Signed)
Location:    [place].   Nursing Home Room Number: 210-W Place of Service:  SNF (31) Provider:  Marlowe Sax, NP   Patient Care Team: [name], DO as PCP - General (Geriatric Medicine) Rehab, [place], Nelda Bucks, NP as Nurse Practitioner (Family Medicine) Carlyle Basques, MD as Consulting Physician (Infectious Diseases) Ala Bent, MD as Referring Physician (Neurology)  Extended Emergency Contact Information Primary Emergency Contact: [name] Mobile Phone: [phone] Relation: Other Secondary Emergency Contact: [name] Mobile Phone: [phone] Relation: Other  Code Status:  Full Code  Goals of care: Advanced Directive information Advanced Directives 05/30/2020  Does Patient Have a Medical Advance Directive? No  Type of Advance Directive -  Does patient want to make changes to medical advance directive? No - Patient declined  Pre-existing out of facility DNR order (yellow form or pink MOST form) -     Chief Complaint  Patient presents with  . Acute Visit    Nausea and Vomiting.    HPI:  Pt is a 64 y.o. female seen today for an acute visit for evaluation of nausea and vomiting.she is seen in her room today awake in bed.Facility Nurse states patient had nausea and vomited bile colored emesis when she tried to take her morning medication.she states did not eat her breakfast due to nausea too. She denies any fever,chills,abdominal pain or cramping.she has had soft bowel movement but no diarrhea. No blood in the stool or emesis reported.she was given her PRN Zofran  4 mg tablet.she was eating her Yorgut during the visit.states was able to tolerate some cranberry juices and Ginger ale.     Past Medical History:  Diagnosis Date  . Actinic keratosis   . BCC (basal cell carcinoma of skin)   . Closed fracture of second metatarsal bone of right foot   . Closed fracture of third metatarsal  bone of right foot with routine healing   . IBS (irritable bowel syndrome)   . Multiple sclerosis ([place])   . Neurogenic bladder   . Rectal polyp    Tubular adenoma  . Recurrent UTI   . SCC (squamous cell carcinoma)    Past Surgical History:  Procedure Laterality Date  . BOTOX INJECTION    . BREAST BIOPSY    . COLONOSCOPY    . SKIN CANCER EXCISION      No Known Allergies  Allergies as of 05/30/2020   No Known Allergies     Medication List       Accurate as of May 30, 2020  3:18 PM. If you have any questions, ask your nurse or doctor.        acetaminophen 325 MG tablet Commonly known as: TYLENOL Take 325 mg by mouth every 4 (four) hours as needed for mild pain, fever or headache.   baclofen 10 MG tablet Commonly known as: LIORESAL Take 10 mg by mouth 3 (three) times daily.   baclofen 20 MG tablet Commonly known as: LIORESAL Take 20 mg by mouth 3 (three) times daily.   BIOFREEZE EX Apply 5 % topically in the morning, at noon, in the evening, and at bedtime. Apply to Left Shoulder.   cetirizine 5 MG tablet Commonly known as: ZYRTEC Take 1 tablet (5 mg total) by mouth at bedtime.   Culturelle Caps Take by mouth. 10B Cell Capsule   dicyclomine 20 MG tablet Commonly known as: BENTYL Take 20 mg by mouth in the morning,  at noon, in the evening, and at bedtime.   FLUoxetine 40 MG capsule Commonly known as: PROZAC Take 1 capsule (40 mg total) by mouth daily.   hydrocortisone cream 1 % Apply 1 application topically every 12 (twelve) hours as needed for itching.   LORazepam 0.5 MG tablet Commonly known as: ATIVAN Take 0.5 mg by mouth daily as needed (Panic attack).   potassium chloride 10 MEQ tablet Commonly known as: KLOR-CON Take 10 mEq by mouth daily.   pravastatin 40 MG tablet Commonly known as: PRAVACHOL Take 40 mg by mouth daily.   tamsulosin 0.4 MG Caps capsule Commonly known as: FLOMAX Take 0.4 mg by mouth daily.   tiZANidine 2 MG  tablet Commonly known as: ZANAFLEX Take 2 mg by mouth at bedtime.   vitamin B-12 1000 MCG tablet Commonly known as: CYANOCOBALAMIN Take 1,000 mcg by mouth daily.   Cholecalciferol 25 MCG (1000 UT) tablet Take 1,000 Units by mouth daily. Take along with a 5000 unit tablet to = 6000 units   Vitamin D (Cholecalciferol) 25 MCG (1000 UT) Tabs Take 5,000 Units by mouth daily. Along with a 1000 unit to = 6000 units   Zofran 4 MG tablet Generic drug: ondansetron Take 4 mg by mouth every 6 (six) hours as needed for nausea or vomiting.       Review of Systems  Constitutional: Negative for appetite change, chills, fatigue and fever.  HENT: Negative for congestion, postnasal drip, rhinorrhea, sinus pressure, sinus pain, sneezing and sore throat.   Respiratory: Negative for cough, chest tightness, shortness of breath and wheezing.   Cardiovascular: Negative for chest pain, palpitations and leg swelling.  Gastrointestinal: Positive for nausea and vomiting. Negative for abdominal distention, abdominal pain, constipation and diarrhea.  Skin: Negative for color change, pallor and rash.  Neurological: Negative for dizziness, speech difficulty, light-headedness and headaches.       Paraplegic   Psychiatric/Behavioral: Negative for agitation, confusion and sleep disturbance.    Immunization History  Administered Date(s) Administered  . Influenza, High Dose Seasonal PF 07/01/2018  . Influenza,inj,Quad PF,6+ Mos 05/20/2013, 05/26/2014, 06/18/2015, 06/18/2016, 06/29/2017, 06/20/2019  . Influenza-Unspecified 06/20/2019  . Moderna SARS-COVID-2 Vaccination 08/19/2019, 10/08/2019  . PPD Test 08/23/2019  . Td 08/18/1998  . Tdap 03/11/2012   Pertinent  Health Maintenance Due  Topic Date Due  . PAP SMEAR-Modifier  Never done  . MAMMOGRAM  Never done  . COLONOSCOPY  Never done  . INFLUENZA VACCINE  03/18/2020   Fall Risk  04/19/2020  Falls in the past year? 0  Risk for fall due to : No Fall Risks   Follow up Falls evaluation completed   Functional Status Survey:    Vitals:   05/30/20 1437  BP: 127/76  Pulse: 66  Resp: 16  Temp: 98.2 F (36.8 C)  SpO2: 98%  Weight: 160 lb 3.2 oz (72.7 kg)  Height: 5\' 5"  (1.651 m)   Body mass index is 26.66 kg/m. Physical Exam Vitals and nursing note reviewed.  Constitutional:      General: She is not in acute distress.    Appearance: She is not ill-appearing.  HENT:     Nose: Nose normal. No congestion or rhinorrhea.     Mouth/Throat:     Mouth: Mucous membranes are moist.     Pharynx: Oropharynx is clear. No oropharyngeal exudate or posterior oropharyngeal erythema.  Eyes:     General: No scleral icterus.       Right eye: No discharge.  Left eye: No discharge.     Extraocular Movements: Extraocular movements intact.     Conjunctiva/sclera: Conjunctivae normal.     Pupils: Pupils are equal, round, and reactive to light.  Cardiovascular:     Rate and Rhythm: Normal rate and regular rhythm.     Pulses: Normal pulses.     Heart sounds: Normal heart sounds. No murmur heard.  No friction rub. No gallop.   Pulmonary:     Effort: Pulmonary effort is normal. No respiratory distress.     Breath sounds: Normal breath sounds. No wheezing, rhonchi or rales.  Chest:     Chest wall: No tenderness.  Abdominal:     General: Bowel sounds are normal. There is no distension.     Palpations: Abdomen is soft. There is no mass.     Tenderness: There is no abdominal tenderness. There is no right CVA tenderness, left CVA tenderness, guarding or rebound.  Musculoskeletal:        General: No swelling or tenderness.     Cervical back: Normal range of motion. No rigidity or tenderness.     Right lower leg: No edema.     Left lower leg: No edema.     Comments: Paraplegic   Lymphadenopathy:     Cervical: No cervical adenopathy.  Skin:    General: Skin is warm.     Coloration: Skin is not pale.     Findings: No erythema.  Neurological:      Mental Status: She is alert and oriented to person, place, and time.     Cranial Nerves: No cranial nerve deficit.     Gait: Gait abnormal.     Comments: Paraplegic   Psychiatric:        Mood and Affect: Mood normal.        Behavior: Behavior normal.        Thought Content: Thought content normal.        Judgment: Judgment normal.     Labs reviewed: Recent Labs    10/01/19 2151 10/10/19 0000 02/08/20 0000 02/13/20 0000 04/26/20 0000  NA 143   < > 138 138 138  K 3.4*   < > 3.3* 3.7 3.9  CL 106   < > 101 103 101  CO2 25   < > 24* 19 22  GLUCOSE 116*  --   --   --   --   BUN 10   < > 9 8 14   CREATININE 0.88   < > 0.7 0.7 0.7  CALCIUM 9.4   < > 9.5 9.0 8.8   < > = values in this interval not displayed.   Recent Labs    10/01/19 2151 10/02/19 0000 10/31/19 0000 01/09/20 0000 02/08/20 0000  AST 18   < > 7* 12* 8*  ALT 15   < > 6* 10 3*  ALKPHOS 52   < > 52 58 56  BILITOT 0.7  --   --   --   --   PROT 7.1  --   --   --   --   ALBUMIN 3.7   < > 3.4* 3.1* 3.8   < > = values in this interval not displayed.   Recent Labs    10/01/19 2151 10/10/19 0000 11/03/19 0000 11/03/19 0000 11/10/19 0000 11/10/19 0000 11/21/19 0000 12/13/19 0000 04/26/20 0000 04/30/20 0000 05/04/20 0000  WBC 21.6*   < > 23.4   < > 14.6   < > 8.1   < >  12.3 15.7 9.6  NEUTROABS 18.9*   < > 20  --  12  --  6  --   --   --   --   HGB 11.7*   < > 12.1   < > 10.7*   < > 11.0*   < > 10.9* 12.3 10.9*  HCT 37.8   < > 38   < > 33*   < > 34*   < > 34* 39 34*  MCV 86.3  --   --   --   --   --   --   --   --   --   --   PLT 335   < > 335   < > 430*   < > 450*   < > 367 405* 471*   < > = values in this interval not displayed.   No results found for: TSH No results found for: HGBA1C Lab Results  Component Value Date   CHOL 163 05/23/2020   HDL 51 05/23/2020   LDLCALC 50 05/23/2020   TRIG 309 (A) 05/23/2020    Significant Diagnostic Results in last 30 days:  No results  found.  Assessment/Plan  Non-intractable vomiting with nausea, unspecified vomiting type Afebrile.Bile colored emesis reported though has not been eating much since she had C-diff.Negative exam findings.states had lb work drawn this morning awaiting results.  Tolerating fluids and yorgut.Request apple sauce,Orange juice and Jello on her meal tray. - continue to advance diet as tolerated - Start on Zofran 4 mg tablet one by mouth three times daily before meals x 4 days then resume as needed.  - will obtain lipase,Amylase if symptoms persist.   2. Hypertriglyceridemia Recent TRG 309 discussed option with Nutritionist but with her recent C-diff infection has not much to cut down on her diet.Agrees to cut down on ice cream.Has taken fish oil in the past which was effective.  - Start on Fish Oil 2 Gm Capsule one by mouth twice daily  - will recheck lipid panel in 3 months.  Family/ staff Communication: Reviewed plan of care with patient   Labs/tests ordered:  lipid panel in 3 months.

## 2020-05-31 ENCOUNTER — Encounter: Payer: Self-pay | Admitting: Internal Medicine

## 2020-05-31 ENCOUNTER — Other Ambulatory Visit: Payer: Self-pay

## 2020-05-31 ENCOUNTER — Telehealth (INDEPENDENT_AMBULATORY_CARE_PROVIDER_SITE_OTHER): Payer: Medicare Other | Admitting: Internal Medicine

## 2020-05-31 DIAGNOSIS — A0471 Enterocolitis due to Clostridium difficile, recurrent: Secondary | ICD-10-CM | POA: Diagnosis not present

## 2020-05-31 DIAGNOSIS — R238 Other skin changes: Secondary | ICD-10-CM | POA: Diagnosis not present

## 2020-05-31 DIAGNOSIS — R197 Diarrhea, unspecified: Secondary | ICD-10-CM | POA: Diagnosis not present

## 2020-05-31 NOTE — Progress Notes (Signed)
Virtual Visit via Telephone Note  I connected with Renee Huang on 05/31/20 at  3:00 PM EDT by telephone and verified that I am speaking with the correct person using two identifiers.  Location: Patient: at SNF Provider: at clinic   I discussed the limitations, risks, security and privacy concerns of performing an evaluation and management service by telephone and the availability of in person appointments. I also discussed with the patient that there may be a patient responsible charge related to this service. The patient expressed understanding and agreed to proceed.   History of Present Illness: Patient has had relapse of cdifficile and is now back on taper of oral vancomycin but is unclear how often she is taking it. Still having significant diarrhea. Having rawness to skin.  -no lung disease -no heart disease/chf  Observations/Objective: Sore bottom despite barrier cream  Assessment and Plan: Refractory cdifficile =   Increase to 4 times oral vancomycin - (send orders to [facility]) and will arrange for  zinplava infusion (does not have contraindication)  Skin breakdown = continue with being seeing by wound care nurse to assess affected area and continue with getting barrier cream  Follow Up Instructions:  Will see back video/call visit in 4 wk after infusion  I discussed the assessment and treatment plan with the patient. The patient was provided an opportunity to ask questions and all were answered. The patient agreed with the plan and demonstrated an understanding of the instructions.   The patient was advised to call back or seek an in-person evaluation if the symptoms worsen or if the condition fails to improve as anticipated.  I provided 20 minutes of non-face-to-face time in coordination of care during this encounter. Send copy to tiffany reed  Carlyle Basques, MD

## 2020-06-01 ENCOUNTER — Telehealth: Payer: Self-pay

## 2020-06-01 ENCOUNTER — Encounter: Payer: Self-pay | Admitting: Internal Medicine

## 2020-06-01 ENCOUNTER — Non-Acute Institutional Stay (SKILLED_NURSING_FACILITY): Payer: Medicare Other | Admitting: Internal Medicine

## 2020-06-01 DIAGNOSIS — G35 Multiple sclerosis: Secondary | ICD-10-CM

## 2020-06-01 DIAGNOSIS — R112 Nausea with vomiting, unspecified: Secondary | ICD-10-CM

## 2020-06-01 DIAGNOSIS — A0471 Enterocolitis due to Clostridium difficile, recurrent: Secondary | ICD-10-CM

## 2020-06-01 NOTE — Telephone Encounter (Addendum)
Renee Huang from SNF-([facility]) called to see where patient will be getting the zinplava infusion at and who will be setting this up for the patient. Contact information for [employer] [phone].  Eve can you please advise or have you started working on this to set it up? Banyan Goodchild T Brooks Sailors

## 2020-06-01 NOTE — Progress Notes (Signed)
Location:  [place] Room Number: 210-W Place of Service:  SNF [phone]) Provider:  Marsella Suman L. Mariea Clonts, D.O., C.M.D.  [name], DO  Patient Care Team: [name], DO as PCP - General (Geriatric Medicine) Rehab, [facility] ([place]) [place], Nelda Bucks, NP as Nurse Practitioner (Family Medicine) Carlyle Basques, MD as Consulting Physician (Infectious Diseases) Ala Bent, MD as Referring Physician (Neurology)  Extended Emergency Contact Information Primary Emergency Contact: [name] Mobile Phone: [phone] Relation: Other Secondary Emergency Contact: [name] Mobile Phone: [phone] Relation: Other  Code Status:  FULL CODE Goals of care: Advanced Directive information Advanced Directives 05/30/2020  Does Patient Have a Medical Advance Directive? No  Type of Advance Directive -  Does patient want to make changes to medical advance directive? No - Patient declined  Pre-existing out of facility DNR order (yellow form or pink MOST form) -     Chief Complaint  Patient presents with  . Acute Visit    Patient is seen for increased confusion, persistent loose stools, and vomiting.     HPI:  Pt is a 64 y.o. female with h/o MS, refractory c diff colitis, neurogenic bladder and depression seen today for an acute visit for increased confusion, continued loose stools and vomiting the past two days (none thus far today).  Upon review of chart, she's seen Dinah about the nausea 10/13 and has been getting zofran 4mg  for this tid before meals for three days and already had q6h prn order.  There was a consideration for pancreatitis if her vomiting persisted.  She also had a phone visit with Dr. Baxter Flattery from ID and recommendation made to increase vanc to qid and zinplava--appears admissions/TOC assisted with the visit.  Over the course of the day, a fax was received with these orders and we verified them so the  pharmacy could obtain the meds.  I am under the impression that the infusion could be administered here during the next week and should be done while she's on the qid vanc per Dr. Storm Frisk instructions.  Past Medical History:  Diagnosis Date  . Actinic keratosis   . BCC (basal cell carcinoma of skin)   . Closed fracture of second metatarsal bone of right foot   . Closed fracture of third metatarsal bone of right foot with routine healing   . IBS (irritable bowel syndrome)   . Multiple sclerosis ([place])   . Neurogenic bladder   . Rectal polyp    Tubular adenoma  . Recurrent UTI   . SCC (squamous cell carcinoma)    Past Surgical History:  Procedure Laterality Date  . BOTOX INJECTION    . BREAST BIOPSY    . COLONOSCOPY    . SKIN CANCER EXCISION      No Known Allergies  Outpatient Encounter Medications as of 06/01/2020  Medication Sig  . acetaminophen (TYLENOL) 325 MG tablet Take 325 mg by mouth every 4 (four) hours as needed for mild pain, fever or headache.   . baclofen (LIORESAL) 10 MG tablet Take 10 mg by mouth 3 (three) times daily.   . baclofen (LIORESAL) 20 MG tablet Take 20 mg by mouth 3 (three) times daily.  . cetirizine (ZYRTEC) 5 MG tablet Take 1 tablet (5 mg total) by mouth at bedtime.  . Cholecalciferol 25 MCG (1000 UT) tablet Take 1,000 Units by mouth daily. Take along with a 5000 unit tablet to = 6000 units  . dicyclomine (BENTYL)  20 MG tablet Take 20 mg by mouth in the morning, at noon, in the evening, and at bedtime.   Marland Kitchen FLUoxetine (PROZAC) 40 MG capsule Take 1 capsule (40 mg total) by mouth daily.  . hydrocortisone cream 1 % Apply 1 application topically every 12 (twelve) hours as needed for itching.   . Lactobacillus Rhamnosus, GG, (CULTURELLE) CAPS Take by mouth. 10B Cell Capsule  . LORazepam (ATIVAN) 0.5 MG tablet Take 0.5 mg by mouth daily as needed (Panic attack).  . Menthol, Topical Analgesic, (BIOFREEZE EX) Apply 5 % topically in the morning, at noon, in the  evening, and at bedtime. Apply to Left Shoulder.  . ondansetron (ZOFRAN) 4 MG tablet Take 4 mg by mouth in the morning, at noon, and at bedtime.   . ondansetron (ZOFRAN) 4 MG tablet Take 4 mg by mouth every 6 (six) hours as needed for nausea or vomiting.  . potassium chloride (KLOR-CON) 10 MEQ tablet Take 10 mEq by mouth daily.   . pravastatin (PRAVACHOL) 40 MG tablet Take 40 mg by mouth daily.  . tamsulosin (FLOMAX) 0.4 MG CAPS capsule Take 0.4 mg by mouth daily.   Marland Kitchen tiZANidine (ZANAFLEX) 2 MG tablet Take 2 mg by mouth at bedtime.  . vitamin B-12 (CYANOCOBALAMIN) 1000 MCG tablet Take 1,000 mcg by mouth daily.  . Vitamin D, Cholecalciferol, 25 MCG (1000 UT) TABS Take 5,000 Units by mouth daily. Along with a 1000 unit to = 6000 units   No facility-administered encounter medications on file as of 06/01/2020.    Review of Systems  Constitutional: Positive for chills and malaise/fatigue. Negative for fever and weight loss.  HENT: Negative for hearing loss.   Eyes: Negative for blurred vision.  Respiratory: Negative for cough and shortness of breath.   Cardiovascular: Negative for chest pain, palpitations and leg swelling.  Gastrointestinal: Positive for diarrhea, nausea and vomiting. Negative for abdominal pain, blood in stool, constipation, heartburn and melena.       No vomiting today  Genitourinary: Negative for dysuria.  Musculoskeletal: Positive for myalgias. Negative for falls and joint pain.  Neurological: Positive for focal weakness. Negative for dizziness and loss of consciousness.  Endo/Heme/Allergies: Does not bruise/bleed easily.  Psychiatric/Behavioral: Positive for depression. Negative for memory loss. The patient is nervous/anxious. The patient does not have insomnia.        Seems to be coping much better now with her illness since prozac dose increased    Immunization History  Administered Date(s) Administered  . Influenza, High Dose Seasonal PF 07/01/2018  .  Influenza,inj,Quad PF,6+ Mos 05/20/2013, 05/26/2014, 06/18/2015, 06/18/2016, 06/29/2017, 06/20/2019  . Influenza-Unspecified 06/20/2019  . Moderna SARS-COVID-2 Vaccination 08/19/2019, 10/08/2019  . PPD Test 08/23/2019  . Td 08/18/1998  . Tdap 03/11/2012   Pertinent  Health Maintenance Due  Topic Date Due  . PAP SMEAR-Modifier  Never done  . MAMMOGRAM  Never done  . COLONOSCOPY  Never done  . INFLUENZA VACCINE  03/18/2020   Fall Risk  05/31/2020 04/19/2020  Falls in the past year? 0 0  Risk for fall due to : No Fall Risks No Fall Risks  Follow up Falls evaluation completed Falls evaluation completed   Functional Status Survey:    Vitals:   06/01/20 1534  BP: (!) 101/54  Pulse: 86  Resp: 18  Temp: 98 F (36.7 C)  TempSrc: Oral  SpO2: 98%  Weight: 160 lb 3.2 oz (72.7 kg)  Height: 5\' 5"  (1.651 m)   Body mass index is 26.66 kg/m.  Physical Exam Vitals reviewed.  Constitutional:      General: She is not in acute distress.    Appearance: Normal appearance. She is not toxic-appearing.     Comments: c diff odor in room  HENT:     Head: Normocephalic and atraumatic.  Cardiovascular:     Rate and Rhythm: Normal rate and regular rhythm.     Pulses: Normal pulses.     Heart sounds: Normal heart sounds.  Pulmonary:     Effort: Pulmonary effort is normal.     Breath sounds: No wheezing, rhonchi or rales.  Abdominal:     General: There is no distension.     Palpations: Abdomen is soft.     Tenderness: There is no abdominal tenderness. There is no guarding or rebound.     Comments: Hyperactive bowel sounds  Musculoskeletal:     Right lower leg: No edema.     Left lower leg: No edema.     Comments: Left weakness  Skin:    General: Skin is warm and dry.     Coloration: Skin is pale.  Neurological:     Mental Status: She is alert. Mental status is at baseline.     Motor: Weakness present.     Gait: Gait abnormal.     Comments: Has not been getting up into power chair  lately since return of loose stools  Psychiatric:        Mood and Affect: Mood normal.     Labs reviewed: Recent Labs    10/01/19 2151 10/10/19 0000 02/08/20 0000 02/13/20 0000 04/26/20 0000  NA 143   < > 138 138 138  K 3.4*   < > 3.3* 3.7 3.9  CL 106   < > 101 103 101  CO2 25   < > 24* 19 22  GLUCOSE 116*  --   --   --   --   BUN 10   < > 9 8 14   CREATININE 0.88   < > 0.7 0.7 0.7  CALCIUM 9.4   < > 9.5 9.0 8.8   < > = values in this interval not displayed.   Recent Labs    10/01/19 2151 10/02/19 0000 10/31/19 0000 01/09/20 0000 02/08/20 0000  AST 18   < > 7* 12* 8*  ALT 15   < > 6* 10 3*  ALKPHOS 52   < > 52 58 56  BILITOT 0.7  --   --   --   --   PROT 7.1  --   --   --   --   ALBUMIN 3.7   < > 3.4* 3.1* 3.8   < > = values in this interval not displayed.   Recent Labs    10/01/19 2151 10/10/19 0000 11/03/19 0000 11/03/19 0000 11/10/19 0000 11/10/19 0000 11/21/19 0000 12/13/19 0000 04/26/20 0000 04/30/20 0000 05/04/20 0000  WBC 21.6*   < > 23.4   < > 14.6   < > 8.1   < > 12.3 15.7 9.6  NEUTROABS 18.9*   < > 20  --  12  --  6  --   --   --   --   HGB 11.7*   < > 12.1   < > 10.7*   < > 11.0*   < > 10.9* 12.3 10.9*  HCT 37.8   < > 38   < > 33*   < > 34*   < > 34* 39 34*  MCV 86.3  --   --   --   --   --   --   --   --   --   --   PLT 335   < > 335   < > 430*   < > 450*   < > 367 405* 471*   < > = values in this interval not displayed.   No results found for: TSH No results found for: HGBA1C Lab Results  Component Value Date   CHOL 163 05/23/2020   HDL 51 05/23/2020   LDLCALC 50 05/23/2020   TRIG 309 (A) 05/23/2020    Significant Diagnostic Results in last 30 days:  No results found.  Assessment/Plan 1. 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 Non-intractable vomiting with nausea, unspecified vomiting type -improved today, able to have some po intake, bland diet recommended, hydration encouraged   3. Multiple sclerosis, primary progressive ([place]) -had a few good weeks b/w c diff diarrhea symptoms where she was able to do therapy and ride the nustep, attend activities and spirits had improved considerably and then symptoms again returned with loose explosive or just pouring out diarrhea as she describes -hopefully, infusion will put an end to this for her b/c she has been unable to get back to her baseline function and it's interfering with her QOL severely  Family/ staff Communication: d/w snf nurse, ADON  Labs/tests ordered:  Should have f/u labs next week after she's had vanc for a week--cbc, bmp   Almost an hour was spent seeing her, coordinating care with nursing, ID, reviewing AHT and epic records  [place] L. Boss Danielsen, D.O. [facility] [address], [place] [address] Cell Phone (Mon-Fri 8am-5pm):  [phone] On Call:  [phone] & follow prompts after 5pm & weekends Office Phone:  [phone] Office Fax:  [phone]

## 2020-06-02 MED ORDER — VANCOMYCIN HCL 125 MG PO CAPS: ORAL_CAPSULE | ORAL | 0 refills | Status: DC

## 2020-06-04 NOTE — Telephone Encounter (Signed)
Spoke with Dr. Baxter Flattery, patient is bedbound so they would not be able to make it to Short stay. [facility] is also unavailable to do infusion, Dr. Baxter Flattery is aware, will continue to follow. Consuelo Pandy

## 2020-06-05 ENCOUNTER — Telehealth: Payer: Self-pay

## 2020-06-05 NOTE — Telephone Encounter (Signed)
.  [facility] Patient Advocate Encounter   I have been unsuccsessful in reaching patient to be able to complete her Insurance underwriter.  I reached out to [facility] [phone] as well , the nurse said she is private pay & I will have to get the information from her. (Mrs Renee Huang)  I will need proof of income.   We have tried multiple times without a response.  [name], [facility] Patient [facility] for Infectious Disease Phone: [phone] Fax:  [phone]

## 2020-06-06 ENCOUNTER — Non-Acute Institutional Stay (SKILLED_NURSING_FACILITY): Payer: Medicare Other | Admitting: Family

## 2020-06-06 DIAGNOSIS — F322 Major depressive disorder, single episode, severe without psychotic features: Secondary | ICD-10-CM | POA: Diagnosis not present

## 2020-06-06 DIAGNOSIS — M62838 Other muscle spasm: Secondary | ICD-10-CM | POA: Diagnosis not present

## 2020-06-06 DIAGNOSIS — L89152 Pressure ulcer of sacral region, stage 2: Secondary | ICD-10-CM | POA: Diagnosis not present

## 2020-06-06 DIAGNOSIS — G35 Multiple sclerosis: Secondary | ICD-10-CM | POA: Diagnosis not present

## 2020-06-06 NOTE — Telephone Encounter (Signed)
Called [facility] and spoke with nursing, confirmed they were able to set up Zinplava infusion, they just needed orders faxed over to them. Orders were then faxed over to [facility] at 9:35 a.m. on 06/06/2020. Consuelo Pandy

## 2020-06-07 ENCOUNTER — Telehealth: Payer: Self-pay

## 2020-06-07 ENCOUNTER — Encounter: Payer: Self-pay | Admitting: Family

## 2020-06-07 NOTE — Progress Notes (Signed)
Location:    [place].   [facility] Room Number: 210-W Place of Service:  SNF (31) Provider:  Marlowe Sax, NP    Patient Care Team: [name], DO as PCP - General (Geriatric Medicine) Rehab, [place] ([place]) [place], Nelda Bucks, NP as Nurse Practitioner (Family Medicine) Carlyle Basques, MD as Consulting Physician (Infectious Diseases) Ala Bent, MD as Referring Physician (Neurology)  Extended Emergency Contact Information Primary Emergency Contact: [name] Mobile Phone: [phone] Relation: Other Secondary Emergency Contact: [name] Mobile Phone: [phone] Relation: Other  Code Status:  FULL CODE Goals of care: Advanced Directive information Advanced Directives 06/07/2020  Does Patient Have a Medical Advance Directive? No  Type of Advance Directive -  Does patient want to make changes to medical advance directive? No - Patient declined  Pre-existing out of facility DNR order (yellow form or pink MOST form) -     Chief Complaint  Patient presents with  . Acute Visit    Muscle Spasm    HPI:  Pt is a 64 y.o. female seen today for an acute visit for evaluation of muscle spasm.she is seen in her room up in the bed per facility Nurse patient reports worsening muscle spasm at bedtime.she states currently on Baclofen 20 mg tablet three times daily.she is concerned that she is not getting her Tizanadine at night.States was prescribed tizanidine 2 mg tablet to take 1 mg tablet daily at bedtime.States when pill is cut it crumbles and she has to take her finger and literally sweep the medicine cup.she has records on her phone states from Neurologist Dr.Pharr for orders for Tizanidine 1 mg tablet daily at bedtime though current medication orders in the facility indicates Tizanidine 2 mg tablet daily.current medication list discussed with patient but got upset and started crying and insist that she is taking  half a pill at bedtime.Medication confirmed with facility Nurse too.recommended increase dose to 4 mg tablet daily but patient did not confirm for dose to be adjusted.will notify provider if dose adjustment required. No spasm reported during visit.     Past Medical History:  Diagnosis Date  . Actinic keratosis   . BCC (basal cell carcinoma of skin)   . Closed fracture of second metatarsal bone of right foot   . Closed fracture of third metatarsal bone of right foot with routine healing   . IBS (irritable bowel syndrome)   . Multiple sclerosis ([place])   . Neurogenic bladder   . Rectal polyp    Tubular adenoma  . Recurrent UTI   . SCC (squamous cell carcinoma)    Past Surgical History:  Procedure Laterality Date  . BOTOX INJECTION    . BREAST BIOPSY    . COLONOSCOPY    . SKIN CANCER EXCISION      No Known Allergies  Allergies as of 06/06/2020   No Known Allergies     Medication List       Accurate as of June 06, 2020 11:59 PM. If you have any questions, ask your nurse or doctor.        acetaminophen 325 MG tablet Commonly known as: TYLENOL Take 325 mg by mouth every 4 (four) hours as needed for mild pain, fever or headache.   baclofen 10 MG tablet Commonly known as: LIORESAL Take 10 mg by mouth 3 (three) times daily.   baclofen 20 MG tablet Commonly known as: LIORESAL Take 20 mg by mouth 3 (three) times daily.  BIOFREEZE EX Apply 5 % topically in the morning, at noon, in the evening, and at bedtime. Apply to Left Shoulder.   cetirizine 5 MG tablet Commonly known as: ZYRTEC Take 1 tablet (5 mg total) by mouth at bedtime.   Culturelle Caps Take by mouth. 10B Cell Capsule   dicyclomine 20 MG tablet Commonly known as: BENTYL Take 20 mg by mouth in the morning, at noon, in the evening, and at bedtime.   FLUoxetine 40 MG capsule Commonly known as: PROZAC Take 1 capsule (40 mg total) by mouth daily.   hydrocortisone cream 1 % Apply 1 application topically  every 12 (twelve) hours as needed for itching.   LORazepam 0.5 MG tablet Commonly known as: ATIVAN Take 0.5 mg by mouth daily as needed (Panic attack).   ondansetron 4 MG tablet Commonly known as: ZOFRAN Take 4 mg by mouth every 6 (six) hours as needed for nausea or vomiting.   potassium chloride 10 MEQ tablet Commonly known as: KLOR-CON Take 10 mEq by mouth daily.   pravastatin 40 MG tablet Commonly known as: PRAVACHOL Take 40 mg by mouth daily.   tamsulosin 0.4 MG Caps capsule Commonly known as: FLOMAX Take 0.4 mg by mouth daily.   tiZANidine 2 MG tablet Commonly known as: ZANAFLEX Take 2 mg by mouth at bedtime.   vancomycin 125 MG capsule Commonly known as: VANCOCIN Take 1 capsule (125 mg total) by mouth 4 (four) times daily for 10 days, THEN 1 capsule (125 mg total) in the morning, at noon, and at bedtime for 7 days, THEN 1 capsule (125 mg total) in the morning and at bedtime for 7 days, THEN 1 capsule (125 mg total) daily for 7 days. Start taking on: June 02, 2020   vitamin B-12 1000 MCG tablet Commonly known as: CYANOCOBALAMIN Take 1,000 mcg by mouth daily.   Cholecalciferol 25 MCG (1000 UT) tablet Take 1,000 Units by mouth daily. Take along with a 5000 unit tablet to = 6000 units   Vitamin D (Cholecalciferol) 25 MCG (1000 UT) Tabs Take 5,000 Units by mouth daily. Along with a 1000 unit to = 6000 units       Review of Systems  Constitutional: Negative for chills, fatigue and fever.  Respiratory: Negative for cough, chest tightness, shortness of breath and wheezing.   Cardiovascular: Negative for chest pain, palpitations and leg swelling.  Gastrointestinal: Negative for abdominal distention, abdominal pain, constipation, diarrhea, nausea and vomiting.  Musculoskeletal: Positive for gait problem. Negative for joint swelling and myalgias.       Muscle spasm worsening at night as per HPI   Neurological: Positive for weakness. Negative for dizziness, speech  difficulty, light-headedness and headaches.       Paraplegic   Psychiatric/Behavioral: Negative for agitation, behavioral problems and sleep disturbance. The patient is nervous/anxious.     Immunization History  Administered Date(s) Administered  . Influenza, High Dose Seasonal PF 07/01/2018  . Influenza,inj,Quad PF,6+ Mos 05/20/2013, 05/26/2014, 06/18/2015, 06/18/2016, 06/29/2017, 06/20/2019  . Influenza-Unspecified 06/20/2019  . Moderna SARS-COVID-2 Vaccination 08/19/2019, 10/08/2019  . PPD Test 08/23/2019  . Td 08/18/1998  . Tdap 03/11/2012   Pertinent  Health Maintenance Due  Topic Date Due  . PAP SMEAR-Modifier  Never done  . MAMMOGRAM  Never done  . COLONOSCOPY  Never done  . INFLUENZA VACCINE  03/18/2020   Fall Risk  05/31/2020 04/19/2020  Falls in the past year? 0 0  Risk for fall due to : No Fall Risks No Fall Risks  Follow up Falls evaluation completed Falls evaluation completed   Functional Status Survey:    Vitals:   06/06/20 0947  BP: (!) 109/57  Pulse: (!) 57  Resp: 20  Temp: 97.6 F (36.4 C)  SpO2: 98%  Weight: 142 lb 12.8 oz (64.8 kg)  Height: 5\' 5"  (1.651 m)   Body mass index is 23.76 kg/m. Physical Exam Vitals and nursing note reviewed.  Constitutional:      General: She is not in acute distress.    Appearance: She is normal weight. She is not ill-appearing.  HENT:     Head: Normocephalic.     Mouth/Throat:     Mouth: Mucous membranes are moist.     Pharynx: Oropharynx is clear. No oropharyngeal exudate or posterior oropharyngeal erythema.  Eyes:     General: No scleral icterus.       Right eye: No discharge.        Left eye: No discharge.     Conjunctiva/sclera: Conjunctivae normal.     Pupils: Pupils are equal, round, and reactive to light.  Cardiovascular:     Rate and Rhythm: Normal rate and regular rhythm.     Pulses: Normal pulses.     Heart sounds: Normal heart sounds. No murmur heard.  No friction rub. No gallop.   Pulmonary:      Effort: Pulmonary effort is normal. No respiratory distress.     Breath sounds: Normal breath sounds. No wheezing, rhonchi or rales.  Chest:     Chest wall: No tenderness.  Abdominal:     General: Bowel sounds are normal. There is no distension.     Palpations: Abdomen is soft. There is no mass.     Tenderness: There is no abdominal tenderness. There is no right CVA tenderness, left CVA tenderness, guarding or rebound.  Musculoskeletal:     Cervical back: Normal range of motion. No rigidity or tenderness.  Lymphadenopathy:     Cervical: No cervical adenopathy.  Neurological:     Mental Status: She is alert and oriented to person, place, and time.     Motor: Weakness present.     Gait: Gait abnormal.     Comments: Forgetful   Psychiatric:        Mood and Affect: Affect is tearful.        Speech: Speech normal.        Behavior: Behavior is agitated.        Thought Content: Thought content normal.    Labs reviewed: Recent Labs    10/01/19 2151 10/10/19 0000 02/08/20 0000 02/13/20 0000 04/26/20 0000  NA 143   < > 138 138 138  K 3.4*   < > 3.3* 3.7 3.9  CL 106   < > 101 103 101  CO2 25   < > 24* 19 22  GLUCOSE 116*  --   --   --   --   BUN 10   < > 9 8 14   CREATININE 0.88   < > 0.7 0.7 0.7  CALCIUM 9.4   < > 9.5 9.0 8.8   < > = values in this interval not displayed.   Recent Labs    10/01/19 2151 10/02/19 0000 10/31/19 0000 01/09/20 0000 02/08/20 0000  AST 18   < > 7* 12* 8*  ALT 15   < > 6* 10 3*  ALKPHOS 52   < > 52 58 56  BILITOT 0.7  --   --   --   --  PROT 7.1  --   --   --   --   ALBUMIN 3.7   < > 3.4* 3.1* 3.8   < > = values in this interval not displayed.   Recent Labs    10/01/19 2151 10/10/19 0000 11/03/19 0000 11/03/19 0000 11/10/19 0000 11/10/19 0000 11/21/19 0000 12/13/19 0000 04/26/20 0000 04/30/20 0000 05/04/20 0000  WBC 21.6*   < > 23.4   < > 14.6   < > 8.1   < > 12.3 15.7 9.6  NEUTROABS 18.9*   < > 20  --  12  --  6  --   --   --    --   HGB 11.7*   < > 12.1   < > 10.7*   < > 11.0*   < > 10.9* 12.3 10.9*  HCT 37.8   < > 38   < > 33*   < > 34*   < > 34* 39 34*  MCV 86.3  --   --   --   --   --   --   --   --   --   --   PLT 335   < > 335   < > 430*   < > 450*   < > 367 405* 471*   < > = values in this interval not displayed.   No results found for: TSH No results found for: HGBA1C Lab Results  Component Value Date   CHOL 163 05/23/2020   HDL 51 05/23/2020   LDLCALC 50 05/23/2020   TRIG 309 (A) 05/23/2020    Significant Diagnostic Results in last 30 days:  No results found.  Assessment/Plan  1. 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 - continue on Baclofen 20 mg tablet three times daily and Tizanidine 2 mg tablet at bedtime.  - continue to follow up with Neurologist as directed.   2. Pressure ulcer of sacral region, stage 2 ([facility]) No signs of infection. - continue with wound care - continue to encourage oral intake  - continue on Protein supplements.   3. Current severe episode of major depressive disorder without psychotic features without prior episode ([place]) Tearful during visit.takes her Ativan as needed  - continue on Fluoxetine 40 mg tablet daily  - will consider Psychiatry referral   4. Muscle spasms of both lower extremities Very confused on her medication as above.  - continue on Baclofen 20 mg tablet three times daily and Tizanidine 2 mg tablet at bedtime.  Family/ staff Communication: Reviewed plan of care with patient and facility Nurse   Labs/tests ordered: None

## 2020-06-07 NOTE — Telephone Encounter (Signed)
[facility] Patient Advocate Encounter  Completed and sent Contractor for [facility] for this patient who is insured.    [employer] Access assistance phone number for follow up is [phone].  This encounter will be updated until final determination.,   [name], [place] Patient [facility] for Infectious Disease Phone: [phone] Fax:  [phone]

## 2020-06-12 ENCOUNTER — Telehealth: Payer: Self-pay

## 2020-06-12 NOTE — Telephone Encounter (Signed)
[facility] Patient Advocate Encounter   I have been unsuccsessful in reaching patient to be able to get patient signature on her Zinplava Application. I sent a fax to [facility] to see if I can get a nurse to have Patient sign the 2 forms, so I can fax back application to [place], [place] Patient [facility] for Infectious Disease Phone: [phone] Fax:  [phone]

## 2020-06-20 ENCOUNTER — Telehealth: Payer: Self-pay

## 2020-06-20 NOTE — Telephone Encounter (Signed)
[facility] Patient Advocate Encounter   Received notification from Medicare D  that prior authorization for Zinplave  is required.   PA submitted on 06/20/20 Key [id number] Status is pending The drug is non formulary on the patient plan, sent a PA to see if they can override and make it formulary on [employer].  I also emailed members service [email or web address] to see if we can get a benefits letter so I can fax to [employer] @ [phone]).    [facility] will continue to follow.   [name], [facility] Patient [facility] for Infectious Disease Phone: [phone] Fax:  [phone]

## 2020-06-21 ENCOUNTER — Non-Acute Institutional Stay (SKILLED_NURSING_FACILITY): Payer: Medicare Other | Admitting: Family

## 2020-06-21 ENCOUNTER — Ambulatory Visit: Payer: Medicare Other

## 2020-06-21 ENCOUNTER — Encounter: Payer: Self-pay | Admitting: Family

## 2020-06-21 ENCOUNTER — Telehealth: Payer: Self-pay

## 2020-06-21 DIAGNOSIS — L89153 Pressure ulcer of sacral region, stage 3: Secondary | ICD-10-CM

## 2020-06-21 DIAGNOSIS — R112 Nausea with vomiting, unspecified: Secondary | ICD-10-CM | POA: Diagnosis not present

## 2020-06-21 NOTE — Telephone Encounter (Signed)
Dr Baxter Flattery - Butch Penny has been working on this for several weeks and we are at a standstill. [employer] will not pay for medication because her insurance approved it but she is in the coverage gap with her Medicare Part D and there is no assistance available right now for Part D patient copays. Her copay is $899 for the infusion.

## 2020-06-21 NOTE — Telephone Encounter (Signed)
[facility] Patient Advocate Encounter  Prior Authorization for [name] has been approved.    Effective dates: 06/20/20 through 06/20/21  Patients co-pay is $898.32. I searched for Co-pay Assistance and there is absolutely no other assistance available for Renee Huang ,due to her having Medicare Part [facility] will continue to follow.  [name], [facility] Patient [name] for Infectious Disease Phone: [phone] Fax:  [phone]

## 2020-06-21 NOTE — Progress Notes (Signed)
Location:    [place].   Nursing Home Room Number: 210-W Place of Service:  SNF (31) Provider:  Marlowe Sax, NP     Patient Care Team: [name], DO as PCP - General (Geriatric Medicine) Rehab, [place] ([place]) [place], Nelda Bucks, NP as Nurse Practitioner (Family Medicine) Carlyle Basques, MD as Consulting Physician (Infectious Diseases) Ala Bent, MD as Referring Physician (Neurology)  Extended Emergency Contact Information Primary Emergency Contact: [name] Mobile Phone: [phone] Relation: Other Secondary Emergency Contact: [name] Mobile Phone: [phone] Relation: Other  Code Status:  FULL CODE Goals of care: Advanced Directive information Advanced Directives 06/21/2020  Does Patient Have a Medical Advance Directive? No  Type of Advance Directive -  Does patient want to make changes to medical advance directive? No - Patient declined  Pre-existing out of facility DNR order (yellow form or pink MOST form) -     Chief Complaint  Patient presents with   Acute Visit    Sacral pain with wound care and nausea.     HPI:  Pt is a 64 y.o. female seen today for an acute visit for evaluation of sacral wound pain and nausea.she is seen in her room today per facility wound Nurse request.Nurse states patient was in pain during sacral wound dressing change.Patient request pain medication prior to wound care.Wound Nurse reports pressure ulcer measure 5.5 X 5.5 X 0.2 cm wound care Graves PA - C continues to follow up.current orders in place to cleanse with saline,pat dry,apply santyl  And cover with foam dressings.has a pressure relieving mattress.she spends most time on supine position.  Also states continues to have nausea whenever she smells food during meals unable to eat much due to nausea.Has worked with [place] who recommended follow up with GI specialist but declines for now.  She denies any fever or  chills.C-diff diarrhea has improved on oral Vancomycin.     Past Medical History:  Diagnosis Date   Actinic keratosis    BCC (basal cell carcinoma of skin)    Closed fracture of second metatarsal bone of right foot    Closed fracture of third metatarsal bone of right foot with routine healing    IBS (irritable bowel syndrome)    Multiple sclerosis (HCC)    Neurogenic bladder    Rectal polyp    Tubular adenoma   Recurrent UTI    SCC (squamous cell carcinoma)    Past Surgical History:  Procedure Laterality Date   BOTOX INJECTION     BREAST BIOPSY     COLONOSCOPY     SKIN CANCER EXCISION      No Known Allergies  Allergies as of 06/21/2020   No Known Allergies     Medication List       Accurate as of June 21, 2020  4:38 PM. If you have any questions, ask your nurse or doctor.        acetaminophen 325 MG tablet Commonly known as: TYLENOL Take 325 mg by mouth every 4 (four) hours as needed for mild pain, fever or headache.   baclofen 10 MG tablet Commonly known as: LIORESAL Take 10 mg by mouth 3 (three) times daily.   baclofen 20 MG tablet Commonly known as: LIORESAL Take 20 mg by mouth 3 (three) times daily.   BIOFREEZE EX Apply 5 % topically in the morning, at noon, in the evening, and at bedtime. Apply to Left Shoulder.   cetirizine 5 MG  tablet Commonly known as: ZYRTEC Take 1 tablet (5 mg total) by mouth at bedtime.   Culturelle Caps Take by mouth. 10B Cell Capsule   dicyclomine 20 MG tablet Commonly known as: BENTYL Take 20 mg by mouth in the morning, at noon, in the evening, and at bedtime.   FLUoxetine 40 MG capsule Commonly known as: PROZAC Take 1 capsule (40 mg total) by mouth daily.   hydrocortisone cream 1 % Apply 1 application topically every 12 (twelve) hours as needed for itching.   LORazepam 0.5 MG tablet Commonly known as: ATIVAN Take 0.5 mg by mouth daily as needed (Panic attack).   ondansetron 4 MG tablet Commonly  known as: ZOFRAN Take 4 mg by mouth every 6 (six) hours as needed for nausea or vomiting.   potassium chloride 10 MEQ tablet Commonly known as: KLOR-CON Take 10 mEq by mouth daily.   pravastatin 40 MG tablet Commonly known as: PRAVACHOL Take 40 mg by mouth daily.   tamsulosin 0.4 MG Caps capsule Commonly known as: FLOMAX Take 0.4 mg by mouth daily.   tiZANidine 2 MG tablet Commonly known as: ZANAFLEX Take 2 mg by mouth at bedtime.   vancomycin 125 MG capsule Commonly known as: VANCOCIN Take 1 capsule (125 mg total) by mouth 4 (four) times daily for 10 days, THEN 1 capsule (125 mg total) in the morning, at noon, and at bedtime for 7 days, THEN 1 capsule (125 mg total) in the morning and at bedtime for 7 days, THEN 1 capsule (125 mg total) daily for 7 days. Start taking on: June 02, 2020   vitamin B-12 1000 MCG tablet Commonly known as: CYANOCOBALAMIN Take 1,000 mcg by mouth daily.   Cholecalciferol 25 MCG (1000 UT) tablet Take 1,000 Units by mouth daily. Take along with a 5000 unit tablet to = 6000 units   Vitamin D (Cholecalciferol) 25 MCG (1000 UT) Tabs Take 5,000 Units by mouth daily. Along with a 1000 unit to = 6000 units       Review of Systems  Constitutional: Negative for appetite change, chills, fatigue and fever.  Respiratory: Negative for cough, chest tightness, shortness of breath and wheezing.   Cardiovascular: Negative for chest pain, palpitations and leg swelling.  Gastrointestinal: Positive for nausea. Negative for abdominal distention, abdominal pain, constipation, diarrhea and vomiting.  Skin: Negative for color change and rash.  Neurological: Positive for weakness. Negative for dizziness, speech difficulty, light-headedness and headaches.  Psychiatric/Behavioral: Negative for agitation, confusion and sleep disturbance. The patient is not nervous/anxious.     Immunization History  Administered Date(s) Administered   Influenza, High Dose Seasonal PF  07/01/2018   Influenza,inj,Quad PF,6+ Mos 05/20/2013, 05/26/2014, 06/18/2015, 06/18/2016, 06/29/2017, 06/20/2019   Influenza-Unspecified 06/20/2019   Moderna SARS-COVID-2 Vaccination 08/19/2019, 10/08/2019   PPD Test 08/23/2019   Td 08/18/1998   Tdap 03/11/2012   Pertinent  Health Maintenance Due  Topic Date Due   PAP SMEAR-Modifier  Never done   MAMMOGRAM  Never done   COLONOSCOPY  Never done   INFLUENZA VACCINE  03/18/2020   Fall Risk  05/31/2020 04/19/2020  Falls in the past year? 0 0  Risk for fall due to : No Fall Risks No Fall Risks  Follow up Falls evaluation completed Falls evaluation completed   Functional Status Survey:    Vitals:   06/21/20 1631  BP: 120/68  Pulse: 77  Resp: 16  Temp: 98 F (36.7 C)  SpO2: 98%  Weight: 142 lb 12.8 oz (64.8 kg)  Height: 5\' 5"  (1.651 m)   Body mass index is 23.76 kg/m. Physical Exam Vitals and nursing note reviewed.  Constitutional:      General: She is not in acute distress.    Appearance: She is not ill-appearing.  HENT:     Head: Normocephalic.  Eyes:     General: No scleral icterus.       Right eye: No discharge.        Left eye: No discharge.     Conjunctiva/sclera: Conjunctivae normal.     Pupils: Pupils are equal, round, and reactive to light.  Cardiovascular:     Rate and Rhythm: Normal rate and regular rhythm.     Pulses: Normal pulses.     Heart sounds: Normal heart sounds. No murmur heard.  No friction rub. No gallop.   Pulmonary:     Effort: Pulmonary effort is normal. No respiratory distress.     Breath sounds: Normal breath sounds. No wheezing, rhonchi or rales.  Chest:     Chest wall: No tenderness.  Abdominal:     General: Bowel sounds are normal. There is no distension.     Palpations: Abdomen is soft. There is no mass.     Tenderness: There is no abdominal tenderness. There is no right CVA tenderness, left CVA tenderness, guarding or rebound.  Musculoskeletal:        General: No  swelling.     Right lower leg: No edema.     Left lower leg: No edema.     Comments: Bilateral lower extremities paraplegic   Skin:    General: Skin is warm and dry.     Coloration: Skin is not pale.     Findings: No bruising, erythema or rash.     Comments: Stage 3 Sacral pressure ulcer with serous drainage  without any signs of infection or odor.   Neurological:     Mental Status: She is alert and oriented to person, place, and time.     Cranial Nerves: No cranial nerve deficit.     Motor: Weakness present.     Gait: Gait abnormal.     Comments: Paraplegic   Psychiatric:        Mood and Affect: Mood normal.        Speech: Speech normal.        Behavior: Behavior normal.        Thought Content: Thought content normal.        Judgment: Judgment normal.     Labs reviewed: Recent Labs    10/01/19 2151 10/10/19 0000 02/08/20 0000 02/13/20 0000 04/26/20 0000  NA 143   < > 138 138 138  K 3.4*   < > 3.3* 3.7 3.9  CL 106   < > 101 103 101  CO2 25   < > 24* 19 22  GLUCOSE 116*  --   --   --   --   BUN 10   < > 9 8 14   CREATININE 0.88   < > 0.7 0.7 0.7  CALCIUM 9.4   < > 9.5 9.0 8.8   < > = values in this interval not displayed.   Recent Labs    10/01/19 2151 10/02/19 0000 10/31/19 0000 01/09/20 0000 02/08/20 0000  AST 18   < > 7* 12* 8*  ALT 15   < > 6* 10 3*  ALKPHOS 52   < > 52 58 56  BILITOT 0.7  --   --   --   --  PROT 7.1  --   --   --   --   ALBUMIN 3.7   < > 3.4* 3.1* 3.8   < > = values in this interval not displayed.   Recent Labs    10/01/19 2151 10/10/19 0000 11/03/19 0000 11/03/19 0000 11/10/19 0000 11/10/19 0000 11/21/19 0000 12/13/19 0000 04/26/20 0000 04/30/20 0000 05/04/20 0000  WBC 21.6*   < > 23.4   < > 14.6   < > 8.1   < > 12.3 15.7 9.6  NEUTROABS 18.9*   < > 20  --  12  --  6  --   --   --   --   HGB 11.7*   < > 12.1   < > 10.7*   < > 11.0*   < > 10.9* 12.3 10.9*  HCT 37.8   < > 38   < > 33*   < > 34*   < > 34* 39 34*  MCV 86.3  --    --   --   --   --   --   --   --   --   --   PLT 335   < > 335   < > 430*   < > 450*   < > 367 405* 471*   < > = values in this interval not displayed.   No results found for: TSH No results found for: HGBA1C Lab Results  Component Value Date   CHOL 163 05/23/2020   HDL 51 05/23/2020   LDLCALC 50 05/23/2020   TRIG 309 (A) 05/23/2020    Significant Diagnostic Results in last 30 days:  No results found.  Assessment/Plan 1. Pressure ulcer of sacral region, stage 3 (HCC) Afebrile. - No signs of infections  - continue with current wound care  - continue to follow up with wound care management specialist provider Graves PA-C  - start on Tramadol 50 mg tablet take 1/2 tablet ( 25 mg tablet ) by mouth 30 minutes prior to wound care.patient does not want narcotic that will make her drowsy. Will continue to monitor.she might benefit from scheduling pain meds but has declined.   2. Non-intractable vomiting with nausea, unspecified vomiting type Reports nausea and vomiting worst during meals.unclear etiology.Will schedule Zofran 4 mg tablet one by mouth three times before meals.  Consider referral to GI if symptoms persist.    Family/ staff Communication: Reviewed plan of care with patient and facility Nurse   Labs/tests ordered: None

## 2020-06-27 ENCOUNTER — Encounter: Payer: Self-pay | Admitting: Family

## 2020-06-27 ENCOUNTER — Non-Acute Institutional Stay (SKILLED_NURSING_FACILITY): Payer: Medicare Other | Admitting: Family

## 2020-06-27 DIAGNOSIS — F322 Major depressive disorder, single episode, severe without psychotic features: Secondary | ICD-10-CM

## 2020-06-27 DIAGNOSIS — I10 Essential (primary) hypertension: Secondary | ICD-10-CM

## 2020-06-27 DIAGNOSIS — M62838 Other muscle spasm: Secondary | ICD-10-CM | POA: Diagnosis not present

## 2020-06-27 DIAGNOSIS — E785 Hyperlipidemia, unspecified: Secondary | ICD-10-CM

## 2020-06-27 DIAGNOSIS — L89153 Pressure ulcer of sacral region, stage 3: Secondary | ICD-10-CM | POA: Diagnosis not present

## 2020-06-27 DIAGNOSIS — A0471 Enterocolitis due to Clostridium difficile, recurrent: Secondary | ICD-10-CM

## 2020-06-27 NOTE — Progress Notes (Signed)
Location:    [place].   Nursing Home Room Number: 210-W Place of Service:  SNF (31) Provider: Marlowe Sax, NP     Patient Care Team: [name], DO as PCP - General (Geriatric Medicine) [facility]) [place], Nelda Bucks, NP as Nurse Practitioner (Family Medicine) Carlyle Basques, MD as Consulting Physician (Infectious Diseases) Ala Bent, MD as Referring Physician (Neurology)  Extended Emergency Contact Information Primary Emergency Contact: [name] Mobile Phone: [phone] Relation: Other Secondary Emergency Contact: [name] Mobile Phone: [phone] Relation: Other  Code Status:  Full Code  Goals of care: Advanced Directive information Advanced Directives 06/27/2020  Does Patient Have a Medical Advance Directive? No  Type of Advance Directive -  Does patient want to make changes to medical advance directive? No - Patient declined  Pre-existing out of facility DNR order (yellow form or pink MOST form) -     Chief Complaint  Patient presents with   Medical Management of Chronic Issues    Routine Visit.    Health Maintenance    Discuss the need for Mammogram, Hepatitis C Screening, HIV Screening, Colonoscopy, and Pap Smear.   Immunizations    Discuss the need for Influenza Vaccine.     HPI:  Pt is a 64 y.o. female seen today for medical management of chronic diseases.she has a medical history of Hypertension,Multiple sclerosis,recurrent C-diff,Neurogenic bladder,IBS among other conditions.she is seen in her room awake in bed.she was introduced to new [facility] Nurse practitioner Amy Cleophas Dunker who will be taking over care in the facility.Patient was pleased to meet with provider.she complained of issues with staff not filing her water pitcher with ice water which she likes to drink due to ongoing diarrhea try's to stay hydrate. She is tearful during the visit.Facility Nurse notified to provider  ice water to patient.  Her main concern today is worsening painful muscle spasm at bedtime which causes her legs to turn outward.she uses her reacher to pull legs back to the middle due to paralysis on the lower extremities.states current muscle relaxant ineffective. Has had no diarrhea today.she is on oral vancomycin for C-diff.Appetite still not there but tries to eat yorgut.Nausea has improved with Zofran with meals.she reports no vomiting.  She has had no recent fall episodes.  Facility wound care Nurse and Wound Care PA continue to manage sacral wounds.Nurse reports patient in a lot of pain during wound care.currnetly on Norco 5/325 mg tablet every 6 hrs as needed.    Past Medical History:  Diagnosis Date   Actinic keratosis    BCC (basal cell carcinoma of skin)    Closed fracture of second metatarsal bone of right foot    Closed fracture of third metatarsal bone of right foot with routine healing    IBS (irritable bowel syndrome)    Multiple sclerosis (HCC)    Neurogenic bladder    Rectal polyp    Tubular adenoma   Recurrent UTI    SCC (squamous cell carcinoma)    Past Surgical History:  Procedure Laterality Date   BOTOX INJECTION     BREAST BIOPSY     COLONOSCOPY     SKIN CANCER EXCISION      No Known Allergies  Allergies as of 06/27/2020   No Known Allergies     Medication List       Accurate as of June 27, 2020  9:22 AM. If you have any questions, ask your nurse or doctor.  STOP taking these medications   sodium chloride 0.9 % SOLN 250 mL with bezlotoxumab 1000 MG/40ML SOLN 10 mg/kg Stopped by: Sandrea Hughs, NP     TAKE these medications   acetaminophen 325 MG tablet Commonly known as: TYLENOL Take 325 mg by mouth every 4 (four) hours as needed for mild pain, fever or headache.   baclofen 10 MG tablet Commonly known as: LIORESAL Take 30 mg by mouth at bedtime. Take 3 tablets to = 30 mg   baclofen 20 MG tablet Commonly known  as: LIORESAL Take 20 mg by mouth 3 (three) times daily.   BIOFREEZE EX Apply 5 % topically in the morning, at noon, in the evening, and at bedtime. Apply to Left Shoulder.   cetirizine 5 MG tablet Commonly known as: ZYRTEC Take 1 tablet (5 mg total) by mouth at bedtime.   Culturelle Caps Take by mouth. 10B Cell Capsule   dicyclomine 20 MG tablet Commonly known as: BENTYL Take 20 mg by mouth in the morning, at noon, in the evening, and at bedtime.   feeding supplement (PRO-STAT SUGAR FREE 64) Liqd Take 30 mLs by mouth in the morning and at bedtime.   FLUoxetine 40 MG capsule Commonly known as: PROZAC Take 1 capsule (40 mg total) by mouth daily.   hydrocortisone cream 1 % Apply 1 application topically every 12 (twelve) hours as needed for itching.   JUVEN PO Take 1 each by mouth in the morning and at bedtime.   LORazepam 0.5 MG tablet Commonly known as: ATIVAN Take 0.5 mg by mouth daily as needed (Panic attack).   ondansetron 4 MG tablet Commonly known as: ZOFRAN Take 4 mg by mouth See admin instructions. 1 tab tid prn  1 tab tid 11/4-11/19/21 and then resume prn   potassium chloride 10 MEQ tablet Commonly known as: KLOR-CON Take 10 mEq by mouth daily.   pravastatin 40 MG tablet Commonly known as: PRAVACHOL Take 40 mg by mouth daily.   tamsulosin 0.4 MG Caps capsule Commonly known as: FLOMAX Take 0.4 mg by mouth daily. What changed: Another medication with the same name was removed. Continue taking this medication, and follow the directions you see here. Changed by: Sandrea Hughs, NP   tiZANidine 2 MG tablet Commonly known as: ZANAFLEX Take 2 mg by mouth at bedtime.   traMADol 50 MG tablet Commonly known as: ULTRAM Take 25 mg by mouth daily. Give 30 minutes prior to wound care   vancomycin 250 MG capsule Commonly known as: VANCOCIN Take 250 mg by mouth 2 (two) times daily. What changed: Another medication with the same name was removed. Continue taking  this medication, and follow the directions you see here. Changed by: Sandrea Hughs, NP   vitamin B-12 1000 MCG tablet Commonly known as: CYANOCOBALAMIN Take 1,000 mcg by mouth daily.   VITAMIN C PO Take 500 mg by mouth 2 (two) times daily.   Cholecalciferol 25 MCG (1000 UT) tablet Take 1,000 Units by mouth daily. Take along with a 5000 unit tablet to = 6000 units   Vitamin D (Cholecalciferol) 25 MCG (1000 UT) Tabs Take 5,000 Units by mouth daily. Along with a 1000 unit to = 6000 units   zinc sulfate 220 (50 Zn) MG capsule Take 220 mg by mouth daily.       Review of Systems  Constitutional: Negative for appetite change, chills, fatigue and fever.  HENT: Negative for congestion, postnasal drip, rhinorrhea, sinus pressure, sinus pain, sneezing and sore  throat.   Eyes: Negative for discharge, redness, itching and visual disturbance.  Respiratory: Negative for cough, chest tightness, shortness of breath and wheezing.   Cardiovascular: Negative for chest pain, palpitations and leg swelling.  Gastrointestinal: Negative for abdominal distention, abdominal pain, constipation and vomiting.       Nausea has improved on Zofran   Endocrine: Negative for cold intolerance, heat intolerance, polydipsia, polyphagia and polyuria.  Genitourinary: Negative for dysuria, flank pain and urgency.       Incontinent   Musculoskeletal: Positive for arthralgias and gait problem. Negative for joint swelling, myalgias and neck pain.  Skin: Positive for wound. Negative for color change, pallor and rash.  Neurological: Positive for weakness. Negative for dizziness, speech difficulty, light-headedness, numbness and headaches.  Hematological: Does not bruise/bleed easily.  Psychiatric/Behavioral: Negative for agitation, behavioral problems, confusion, hallucinations, sleep disturbance and suicidal ideas. The patient is nervous/anxious.     Immunization History  Administered Date(s) Administered    Influenza, High Dose Seasonal PF 07/01/2018   Influenza,inj,Quad PF,6+ Mos 05/20/2013, 05/26/2014, 06/18/2015, 06/18/2016, 06/29/2017, 06/20/2019   Influenza-Unspecified 06/20/2019   Moderna SARS-COVID-2 Vaccination 08/19/2019, 10/08/2019   PPD Test 08/23/2019   Td 08/18/1998   Tdap 03/11/2012   Pertinent  Health Maintenance Due  Topic Date Due   PAP SMEAR-Modifier  Never done   MAMMOGRAM  Never done   COLONOSCOPY  Never done   INFLUENZA VACCINE  03/18/2020   Fall Risk  05/31/2020 04/19/2020  Falls in the past year? 0 0  Risk for fall due to : No Fall Risks No Fall Risks  Follow up Falls evaluation completed Falls evaluation completed   Functional Status Survey:    Vitals:   06/27/20 0900  BP: (!) 146/86  Pulse: 88  Resp: (!) 22  Temp: (!) 96.6 F (35.9 C)  SpO2: 98%  Weight: 140 lb (63.5 kg)  Height: 5\' 5"  (1.651 m)   Body mass index is 23.3 kg/m. Physical Exam Vitals and nursing note reviewed.  Constitutional:      General: She is not in acute distress.    Appearance: She is not ill-appearing.  HENT:     Head: Normocephalic.     Nose: Nose normal. No congestion or rhinorrhea.     Mouth/Throat:     Mouth: Mucous membranes are moist.     Pharynx: Oropharynx is clear. No oropharyngeal exudate or posterior oropharyngeal erythema.  Eyes:     General: No scleral icterus.       Right eye: No discharge.        Left eye: No discharge.     Conjunctiva/sclera: Conjunctivae normal.     Pupils: Pupils are equal, round, and reactive to light.  Neck:     Vascular: No carotid bruit.  Cardiovascular:     Rate and Rhythm: Normal rate and regular rhythm.     Pulses: Normal pulses.     Heart sounds: Normal heart sounds. No murmur heard.  No friction rub. No gallop.   Pulmonary:     Effort: Pulmonary effort is normal. No respiratory distress.     Breath sounds: Normal breath sounds. No wheezing, rhonchi or rales.  Chest:     Chest wall: No tenderness.  Abdominal:      General: Bowel sounds are normal. There is no distension.     Palpations: Abdomen is soft. There is no mass.     Tenderness: There is no abdominal tenderness. There is no right CVA tenderness, left CVA tenderness, guarding or rebound.  Musculoskeletal:        General: No swelling or tenderness.     Cervical back: Normal range of motion. No rigidity or tenderness.     Right lower leg: No edema.     Left lower leg: No edema.     Comments: Paraplegic   Lymphadenopathy:     Cervical: No cervical adenopathy.  Skin:    General: Skin is warm and dry.     Coloration: Skin is not pale.     Findings: No bruising, erythema or rash.     Comments: Wound not visualized this visit dressing already changed by wound care Nurse.states just got in a comfortable position.  Neurological:     Mental Status: She is alert and oriented to person, place, and time.     Cranial Nerves: No cranial nerve deficit.     Motor: Weakness present.     Gait: Gait abnormal.     Comments: Bilateral lower extremities weakness   Psychiatric:        Mood and Affect: Affect is tearful.        Thought Content: Thought content normal.        Judgment: Judgment normal.     Labs reviewed: Recent Labs    10/01/19 2151 10/10/19 0000 02/08/20 0000 02/13/20 0000 04/26/20 0000  NA 143   < > 138 138 138  K 3.4*   < > 3.3* 3.7 3.9  CL 106   < > 101 103 101  CO2 25   < > 24* 19 22  GLUCOSE 116*  --   --   --   --   BUN 10   < > 9 8 14   CREATININE 0.88   < > 0.7 0.7 0.7  CALCIUM 9.4   < > 9.5 9.0 8.8   < > = values in this interval not displayed.   Recent Labs    10/01/19 2151 10/02/19 0000 10/31/19 0000 01/09/20 0000 02/08/20 0000  AST 18   < > 7* 12* 8*  ALT 15   < > 6* 10 3*  ALKPHOS 52   < > 52 58 56  BILITOT 0.7  --   --   --   --   PROT 7.1  --   --   --   --   ALBUMIN 3.7   < > 3.4* 3.1* 3.8   < > = values in this interval not displayed.   Recent Labs    10/01/19 2151 10/10/19 0000  11/03/19 0000 11/03/19 0000 11/10/19 0000 11/10/19 0000 11/21/19 0000 12/13/19 0000 04/26/20 0000 04/30/20 0000 05/04/20 0000  WBC 21.6*   < > 23.4   < > 14.6   < > 8.1   < > 12.3 15.7 9.6  NEUTROABS 18.9*   < > 20  --  12  --  6  --   --   --   --   HGB 11.7*   < > 12.1   < > 10.7*   < > 11.0*   < > 10.9* 12.3 10.9*  HCT 37.8   < > 38   < > 33*   < > 34*   < > 34* 39 34*  MCV 86.3  --   --   --   --   --   --   --   --   --   --   PLT 335   < > 335   < > 430*   < >  450*   < > 367 405* 471*   < > = values in this interval not displayed.   No results found for: TSH No results found for: HGBA1C Lab Results  Component Value Date   CHOL 163 05/23/2020   HDL 51 05/23/2020   LDLCALC 50 05/23/2020   TRIG 309 (A) 05/23/2020    Significant Diagnostic Results in last 30 days:  No results found.  Assessment/Plan 1. Muscle spasms of both lower extremities Worsening lower extremities painful spasm worst at bedtime. - continue on baclofen and adjust tizanidine to 4 mg tablet at bedtime. - continue to monitor   2. Pressure ulcer of sacral region, stage 3 (HCC) Wound not visualized this visit dressing already changed by wound care Nurse.states just got in a comfortable position. Continue with wound care management with wound care nurse and PA  Administer Norco 5/325 mg tablet one by mouth 30 minutes prior to wound care dressing change for better pain control during dressing change.  3. Essential hypertension B/p stable. Continue to monitor  4. Hyperlipidemia LDL goal <100 Latest LDL reviewed at goal but TRG are high.Omega -3 fatty acid started on previous visit. - continue on Pravachol   5. Recurrent Clostridium difficile diarrhea Diarrhea has improved on Vancomycin  - continue to encouraged oral intake and Hydration.  6. Current severe episode of major depressive disorder without psychotic features without prior episode ([place]) Tearful during visit. - continue on Fluoxetine and  Lorazepam PRN  - continue to monitor for mood changes.   Family/ staff Communication: Reviewed plan of care with patient and facility Nurse   Labs/tests ordered: Labs drawn prior to visit awaiting results.

## 2020-06-28 LAB — COMPREHENSIVE METABOLIC PANEL
Calcium: 9.1 (ref 8.7–10.7)
GFR calc Af Amer: >90
GFR calc non Af Amer: 87.27

## 2020-06-28 LAB — POCT ERYTHROCYTE SEDIMENTATION RATE, NON-AUTOMATED: Sed Rate: 107

## 2020-06-28 LAB — BASIC METABOLIC PANEL
BUN: 26 — AB (ref 4–21)
CO2: 24 — AB (ref 13–22)
Chloride: 97 — AB (ref 99–108)
Creatinine: 0.7 (ref 0.5–1.1)
Glucose: 72
Potassium: 4.6 (ref 3.4–5.3)
Sodium: 131 — AB (ref 137–147)

## 2020-06-28 LAB — CBC AND DIFFERENTIAL
HCT: 33 — AB (ref 36–46)
Hemoglobin: 10.5 — AB (ref 12.0–16.0)
Platelets: 695 — AB (ref 150–399)
WBC: 18.6

## 2020-06-28 LAB — CBC: RBC: 4.23 (ref 3.87–5.11)

## 2020-06-29 ENCOUNTER — Encounter: Payer: Self-pay | Admitting: Internal Medicine

## 2020-06-29 ENCOUNTER — Non-Acute Institutional Stay (SKILLED_NURSING_FACILITY): Payer: Medicare Other | Admitting: Internal Medicine

## 2020-06-29 DIAGNOSIS — L89300 Pressure ulcer of unspecified buttock, unstageable: Secondary | ICD-10-CM | POA: Diagnosis not present

## 2020-06-29 DIAGNOSIS — A0471 Enterocolitis due to Clostridium difficile, recurrent: Secondary | ICD-10-CM

## 2020-06-29 DIAGNOSIS — M8618 Other acute osteomyelitis, other site: Secondary | ICD-10-CM

## 2020-06-29 DIAGNOSIS — G35 Multiple sclerosis: Secondary | ICD-10-CM

## 2020-06-29 MED ORDER — HYDROCODONE-ACETAMINOPHEN 5-325 MG PO TABS: 1.0000 | ORAL_TABLET | Freq: Four times a day (QID) | ORAL | 0 refills | Status: DC | PRN

## 2020-06-29 NOTE — Progress Notes (Signed)
Location:   [facility] Service:   SNF Provider:  Rasheda Ledger L. Mariea Clonts, D.O., C.M.D.  [name], DO  Patient Care Team: [name], DO as PCP - General (Geriatric Medicine) Rehab, [place] ([place]) [place], Nelda Bucks, NP as Nurse Practitioner (Family Medicine) Carlyle Basques, MD as Consulting Physician (Infectious Diseases) Ala Bent, MD as Referring Physician (Neurology)  Extended Emergency Contact Information Primary Emergency Contact: [name] Mobile Phone: [phone] Relation: Other Secondary Emergency Contact: [name] Mobile Phone: [phone] Relation: Other  Goals of care: Advanced Directive information Advanced Directives 06/27/2020  Does Patient Have a Medical Advance Directive? No  Type of Advance Directive -  Does patient want to make changes to medical advance directive? No - Patient declined  Pre-existing out of facility DNR order (yellow form or pink MOST form) -     No chief complaint on file.   HPI:  Pt is a 64 y.o. female seen today for an acute visit for elevated ESR and CRP and WBC 18.6 done 06/28/20 due to concerns that her sacral pressure injury had bone that could be probed and she has been having considerable pain.  When I was here Tuesday, I'd prescribed her hydrocodone for pain not just before dressing changes as originally ordered but every 6 hrs as needed for 14 days.   Re: her refractory c diff, the monoclonal ab tx would not be given due to cost--in donut hole and patient assistance from [employer] cannot help amid that b/c technically insurance approved.  Not to mention, she's long completed the qid vanc dosing--she's down to daily dosing on vanc taper.  She had loose stool on Monday and Tuesday, but none reported more recently per CNAs.    Per nursing notes:  "UNSTAGEABLE PRESSURE INJURY TO SACRUM: measures 6.0 x 4.5 x 2.0cm, total undermining length: 7.5cm; total undermining width  measures 6.5cm. Small amount of serous exudate note. Slight odor noted. Order in place for pain medication 30 min prior to wound care. Periwound intact. No periwound maceration noted. Lupita Leash, PA-C [facility], in facility on wound rounds 06/25/2020, wound assessed. Order in place to cleanse with Dakins solution, pat dry, pack with Santyl and moistened dakins gauze and cover with foam dressing QD. Pressure Guard mattress in place, settings to be at "3" at all times. Resident is own RR, updated and agrees with treatment plan of care. Dr. Mariea Clonts updated in facility."  Past Medical History:  Diagnosis Date  . Actinic keratosis   . BCC (basal cell carcinoma of skin)   . Closed fracture of second metatarsal bone of right foot   . Closed fracture of third metatarsal bone of right foot with routine healing   . IBS (irritable bowel syndrome)   . Multiple sclerosis ([place])   . Neurogenic bladder   . Rectal polyp    Tubular adenoma  . Recurrent UTI   . SCC (squamous cell carcinoma)    Past Surgical History:  Procedure Laterality Date  . BOTOX INJECTION    . BREAST BIOPSY    . COLONOSCOPY    . SKIN CANCER EXCISION      No Known Allergies  Outpatient Encounter Medications as of 06/29/2020  Medication Sig  . acetaminophen (TYLENOL) 325 MG tablet Take 325 mg by mouth every 4 (four) hours as needed for mild pain, fever or headache.   . Amino Acids-Protein Hydrolys (FEEDING SUPPLEMENT, PRO-STAT SUGAR FREE 64,) LIQD Take 30 mLs by mouth  in the morning and at bedtime.  . Ascorbic Acid (VITAMIN C PO) Take 500 mg by mouth 2 (two) times daily.  . baclofen (LIORESAL) 10 MG tablet Take 30 mg by mouth at bedtime. Take 3 tablets to = 30 mg  . baclofen (LIORESAL) 20 MG tablet Take 20 mg by mouth 3 (three) times daily.  . cetirizine (ZYRTEC) 5 MG tablet Take 1 tablet (5 mg total) by mouth at bedtime.  . Cholecalciferol 25 MCG (1000 UT) tablet Take 1,000 Units by mouth daily. Take along with a 5000 unit tablet to =  6000 units  . dicyclomine (BENTYL) 20 MG tablet Take 20 mg by mouth in the morning, at noon, in the evening, and at bedtime.   Marland Kitchen FLUoxetine (PROZAC) 40 MG capsule Take 1 capsule (40 mg total) by mouth daily.  Marland Kitchen HYDROcodone-acetaminophen (NORCO/VICODIN) 5-325 MG tablet Take 1 tablet by mouth every 6 (six) hours as needed for up to 14 days for moderate pain or severe pain.  . hydrocortisone cream 1 % Apply 1 application topically every 12 (twelve) hours as needed for itching.   . Lactobacillus Rhamnosus, GG, (CULTURELLE) CAPS Take by mouth. 10B Cell Capsule  . LORazepam (ATIVAN) 0.5 MG tablet Take 0.5 mg by mouth daily as needed (Panic attack).  . Menthol, Topical Analgesic, (BIOFREEZE EX) Apply 5 % topically in the morning, at noon, in the evening, and at bedtime. Apply to Left Shoulder.  . Nutritional Supplements (JUVEN PO) Take 1 each by mouth in the morning and at bedtime.  . ondansetron (ZOFRAN) 4 MG tablet Take 4 mg by mouth See admin instructions. 1 tab tid prn  1 tab tid 11/4-11/19/21 and then resume prn  . potassium chloride (KLOR-CON) 10 MEQ tablet Take 10 mEq by mouth daily.   . pravastatin (PRAVACHOL) 40 MG tablet Take 40 mg by mouth daily.  . tamsulosin (FLOMAX) 0.4 MG CAPS capsule Take 0.4 mg by mouth daily.  Marland Kitchen tiZANidine (ZANAFLEX) 2 MG tablet Take 2 mg by mouth at bedtime.  . traMADol (ULTRAM) 50 MG tablet Take 25 mg by mouth daily. Give 30 minutes prior to wound care  . vancomycin (VANCOCIN) 250 MG capsule Take 250 mg by mouth 2 (two) times daily.  . vitamin B-12 (CYANOCOBALAMIN) 1000 MCG tablet Take 1,000 mcg by mouth daily.  . Vitamin D, Cholecalciferol, 25 MCG (1000 UT) TABS Take 5,000 Units by mouth daily. Along with a 1000 unit to = 6000 units  . zinc sulfate 220 (50 Zn) MG capsule Take 220 mg by mouth daily.   No facility-administered encounter medications on file as of 06/29/2020.    Review of Systems  Constitutional: Positive for malaise/fatigue. Negative for chills  and fever.       Very little intake recently  HENT: Negative for congestion and sore throat.   Eyes: Negative for blurred vision.  Respiratory: Negative for cough and shortness of breath.   Cardiovascular: Negative for chest pain, palpitations and leg swelling.  Gastrointestinal: Positive for diarrhea. Negative for abdominal pain, blood in stool, constipation and melena.  Genitourinary: Negative for dysuria.  Musculoskeletal: Negative for falls and joint pain.  Skin:       Large sacral pressure injury with bone that is palpable  Neurological: Negative for dizziness and loss of consciousness.  Psychiatric/Behavioral: Negative for memory loss. The patient is nervous/anxious. The patient does not have insomnia.        Confusion at present; thinks someone is going to kick her out of here  on the street in february    Immunization History  Administered Date(s) Administered  . Influenza, High Dose Seasonal PF 07/01/2018  . Influenza,inj,Quad PF,6+ Mos 05/20/2013, 05/26/2014, 06/18/2015, 06/18/2016, 06/29/2017, 06/20/2019  . Influenza-Unspecified 06/20/2019  . Moderna SARS-COVID-2 Vaccination 08/19/2019, 10/08/2019  . PPD Test 08/23/2019  . Td 08/18/1998  . Tdap 03/11/2012   Pertinent  Health Maintenance Due  Topic Date Due  . PAP SMEAR-Modifier  Never done  . MAMMOGRAM  Never done  . COLONOSCOPY  Never done  . INFLUENZA VACCINE  03/18/2020   Fall Risk  05/31/2020 04/19/2020  Falls in the past year? 0 0  Risk for fall due to : No Fall Risks No Fall Risks  Follow up Falls evaluation completed Falls evaluation completed   Functional Status Survey:    There were no vitals filed for this visit. There is no height or weight on file to calculate BMI. Physical Exam Constitutional:      Appearance: She is not toxic-appearing.     Comments: tearful  HENT:     Head: Normocephalic and atraumatic.  Eyes:     Extraocular Movements: Extraocular movements intact.     Pupils: Pupils are  equal, round, and reactive to light.  Cardiovascular:     Rate and Rhythm: Normal rate and regular rhythm.     Heart sounds: No murmur heard.   Pulmonary:     Effort: Pulmonary effort is normal.     Breath sounds: Normal breath sounds.  Abdominal:     General: Bowel sounds are normal.     Tenderness: There is no abdominal tenderness.  Musculoskeletal:        General: Normal range of motion.     Right lower leg: No edema.     Left lower leg: No edema.  Skin:    Comments: Pressure injury of buttock, spine of sacrum visible and probable, some yellow granulation tissue deep in wound over some muscle; mild surrounding erythema and mild odor detected  Neurological:     General: No focal deficit present.     Mental Status: She is alert and oriented to person, place, and time.  Psychiatric:        Mood and Affect: Mood normal.        Behavior: Behavior normal.     Labs reviewed: Recent Labs    10/01/19 2151 10/10/19 0000 02/08/20 0000 02/13/20 0000 04/26/20 0000  NA 143   < > 138 138 138  K 3.4*   < > 3.3* 3.7 3.9  CL 106   < > 101 103 101  CO2 25   < > 24* 19 22  GLUCOSE 116*  --   --   --   --   BUN 10   < > 9 8 14   CREATININE 0.88   < > 0.7 0.7 0.7  CALCIUM 9.4   < > 9.5 9.0 8.8   < > = values in this interval not displayed.   Recent Labs    10/01/19 2151 10/02/19 0000 10/31/19 0000 01/09/20 0000 02/08/20 0000  AST 18   < > 7* 12* 8*  ALT 15   < > 6* 10 3*  ALKPHOS 52   < > 52 58 56  BILITOT 0.7  --   --   --   --   PROT 7.1  --   --   --   --   ALBUMIN 3.7   < > 3.4* 3.1* 3.8   < > =  values in this interval not displayed.   Recent Labs    10/01/19 2151 10/10/19 0000 11/03/19 0000 11/03/19 0000 11/10/19 0000 11/10/19 0000 11/21/19 0000 12/13/19 0000 04/26/20 0000 04/30/20 0000 05/04/20 0000  WBC 21.6*   < > 23.4   < > 14.6   < > 8.1   < > 12.3 15.7 9.6  NEUTROABS 18.9*   < > 20  --  12  --  6  --   --   --   --   HGB 11.7*   < > 12.1   < > 10.7*    < > 11.0*   < > 10.9* 12.3 10.9*  HCT 37.8   < > 38   < > 33*   < > 34*   < > 34* 39 34*  MCV 86.3  --   --   --   --   --   --   --   --   --   --   PLT 335   < > 335   < > 430*   < > 450*   < > 367 405* 471*   < > = values in this interval not displayed.   No results found for: TSH No results found for: HGBA1C Lab Results  Component Value Date   CHOL 163 05/23/2020   HDL 51 05/23/2020   LDLCALC 50 05/23/2020   TRIG 309 (A) 05/23/2020    Significant Diagnostic Results in last 30 days:  No results found.  Assessment/Plan 1. Other acute osteomyelitis, other site [facility]) -suspect--Wound care NP will be here Monday and plan is for her to obtain bone biopsy and send for culture -pt is on her oral vanc 150m daily at this point for her c diff  -has leukocytosis and CRP 38.78 and ESR of 107, some hyponatremia -will start IVFs for hyponatremia--NS @ 80cc/hr x 2 liters -repeat bmp and cbc Monday  2. 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 Multiple sclerosis, primary progressive [place] -off treatment due to her c diff   4. Pressure injury of buttock, unstageable, unspecified laterality ([place] -visible sacral bone now -concern for osteo -cont packing, foam dressing, dakins per wound care specialist due to odor -need bone bx and if positive, will need Dr. SStorm Friskinput to determine next steps due to her c diff  Family/ staff Communication: d/w snf nurse and wound care nurse  Labs/tests ordered:  Repeat cbc and bmp monday  Nadja Lina L. Mima Cranmore, D.O. [facility] [address] [place] [id number] Phone (Mon-Fri 8am-5pm):  [phone] Call:  [phone]& follow prompts after 5pm & weekends Office Phone:  [phone] Fax:  [phone]

## 2020-07-02 LAB — BASIC METABOLIC PANEL
CO2: 23 — AB (ref 13–22)
Chloride: 104 (ref 99–108)
Creatinine: 0.6 (ref 0.5–1.1)
Glucose: 68
Potassium: 5.1 (ref 3.4–5.3)
Sodium: 137 (ref 137–147)

## 2020-07-02 LAB — CBC AND DIFFERENTIAL
HCT: 33 — AB (ref 36–46)
Hemoglobin: 10.1 — AB (ref 12.0–16.0)
Platelets: 551 — AB (ref 150–399)
WBC: 18.1

## 2020-07-02 LAB — COMPREHENSIVE METABOLIC PANEL
Calcium: 8.9 (ref 8.7–10.7)
GFR calc Af Amer: >90
GFR calc non Af Amer: >90

## 2020-07-02 LAB — CBC: RBC: 4.1 (ref 3.87–5.11)

## 2020-07-03 ENCOUNTER — Other Ambulatory Visit: Payer: Self-pay | Admitting: Internal Medicine

## 2020-07-03 MED ORDER — HYDROCODONE-ACETAMINOPHEN 5-325 MG PO TABS: 1.0000 | ORAL_TABLET | ORAL | 0 refills | Status: DC

## 2020-07-03 NOTE — Progress Notes (Signed)
Spoke with Leonard Downing, wound care nurse and received message from dietitian about pt's nutritional status.  Check iron panel with her cbc on thurs along with repeat esr and crp.  Wound care NP did not come as planned Monday so will xray sacrum and bilateral pelvis in the interim to r/o any severe osteo.  May need MRI if not helpful but we'd hoped to get bone biopsy as gold std.  Pain worse and q6h not holding her even when given fairly routinely so increase to q4h scheduled while awake.  D/c prn.  Avoid exceeding 3g/d of apap.  Add remeron 7.9m qhs for appetite and mood though we know why appetite poor due to her c diff and now possible osteo.

## 2020-07-04 ENCOUNTER — Encounter: Payer: Self-pay | Admitting: Orthopedic Surgery

## 2020-07-04 ENCOUNTER — Non-Acute Institutional Stay (SKILLED_NURSING_FACILITY): Payer: Medicare Other | Admitting: Orthopedic Surgery

## 2020-07-04 DIAGNOSIS — L89300 Pressure ulcer of unspecified buttock, unstageable: Secondary | ICD-10-CM

## 2020-07-04 DIAGNOSIS — A0471 Enterocolitis due to Clostridium difficile, recurrent: Secondary | ICD-10-CM | POA: Diagnosis not present

## 2020-07-04 LAB — CBC AND DIFFERENTIAL
HCT: 29 — AB (ref 36–46)
Hemoglobin: 9.5 — AB (ref 12.0–16.0)
Platelets: 555 — AB (ref 150–399)
WBC: 17.6

## 2020-07-04 LAB — IRON,TIBC AND FERRITIN PANEL
Ferritin: 491
Iron: 20

## 2020-07-04 LAB — CBC: RBC: 3.65 — AB (ref 3.87–5.11)

## 2020-07-04 LAB — POCT ERYTHROCYTE SEDIMENTATION RATE, NON-AUTOMATED: Sed Rate: 74

## 2020-07-04 NOTE — Progress Notes (Signed)
Location:    [place] [facility] Room Number: 210/W Place of Service:  SNF (31) Provider:  Windell Moulding NP  [name], DO  Patient Care Team: [name], DO as PCP - General (Geriatric Medicine) Rehab, [place] ([place], Nelda Bucks, NP as Nurse Practitioner (Family Medicine) Carlyle Basques, MD as Consulting Physician (Infectious Diseases) Ala Bent, MD as Referring Physician (Neurology)  Extended Emergency Contact Information Primary Emergency Contact: [name] Mobile Phone: [phone] Relation: Other Secondary Emergency Contact: [name] Mobile Phone: [phone] Relation: Other  Code Status:  Full Code Goals of care: Advanced Directive information Advanced Directives 07/04/2020  Does Patient Have a Medical Advance Directive? Yes  Type of Advance Directive -  Does patient want to make changes to medical advance directive? No - Patient declined  Pre-existing out of facility DNR order (yellow form or pink MOST form) -     Chief Complaint  Patient presents with  . Acute Visit    HPI:  Pt is a 64 y.o. female seen today for an acute visit for    Past Medical History:  Diagnosis Date  . Actinic keratosis   . BCC (basal cell carcinoma of skin)   . Closed fracture of second metatarsal bone of right foot   . Closed fracture of third metatarsal bone of right foot with routine healing   . IBS (irritable bowel syndrome)   . Multiple sclerosis ([place])   . Neurogenic bladder   . Rectal polyp    Tubular adenoma  . Recurrent UTI   . SCC (squamous cell carcinoma)    Past Surgical History:  Procedure Laterality Date  . BOTOX INJECTION    . BREAST BIOPSY    . COLONOSCOPY    . SKIN CANCER EXCISION      No Known Allergies  Allergies as of 07/04/2020   No Known Allergies     Medication List       Accurate as of July 04, 2020  4:04 PM. If you have any questions, ask your nurse or  doctor.        STOP taking these medications   traMADol 50 MG tablet Commonly known as: ULTRAM Stopped by: Yvonna Alanis, NP     TAKE these medications   acetaminophen 325 MG tablet Commonly known as: TYLENOL Take 325 mg by mouth every 4 (four) hours as needed for mild pain, fever or headache.   baclofen 10 MG tablet Commonly known as: LIORESAL Take 30 mg by mouth at bedtime. Take 3 tablets to = 30 mg   baclofen 20 MG tablet Commonly known as: LIORESAL Take 20 mg by mouth 3 (three) times daily.   BIOFREEZE EX Apply 5 % topically in the morning, at noon, in the evening, and at bedtime. Apply to Left Shoulder.   cetirizine 5 MG tablet Commonly known as: ZYRTEC Take 1 tablet (5 mg total) by mouth at bedtime.   Culturelle Caps Take by mouth. 10B Cell Capsule   dicyclomine 20 MG tablet Commonly known as: BENTYL Take 20 mg by mouth in the morning, at noon, in the evening, and at bedtime.   feeding supplement (PRO-STAT SUGAR FREE 64) Liqd Take 30 mLs by mouth in the morning and at bedtime.   FLUoxetine 40 MG capsule Commonly known as: PROZAC Take 1 capsule (40 mg total) by mouth daily.   HYDROcodone-acetaminophen 5-325 MG tablet Commonly known as: NORCO/VICODIN Take 1 tablet by mouth every  4 (four) hours. While awake   hydrocortisone cream 1 % Apply 1 application topically every 12 (twelve) hours as needed for itching.   JUVEN PO Take 1 each by mouth in the morning and at bedtime.   ENSURE CLEAR PO Take by mouth in the morning and at bedtime. D/t weight loss and wound healing   LORazepam 0.5 MG tablet Commonly known as: ATIVAN Take 0.5 mg by mouth daily as needed (Panic attack).   mirtazapine 7.5 MG tablet Commonly known as: REMERON Take 7.5 mg by mouth at bedtime.   NON FORMULARY Magic Cup with L/D meals d/t weight loss.   ondansetron 4 MG tablet Commonly known as: ZOFRAN Take 4 mg by mouth in the morning, at noon, and at bedtime. Nausea and  Vomiting Start taking on: July 06, 2020   potassium chloride 10 MEQ tablet Commonly known as: KLOR-CON Take 10 mEq by mouth daily.   pravastatin 40 MG tablet Commonly known as: PRAVACHOL Take 40 mg by mouth daily.   tamsulosin 0.4 MG Caps capsule Commonly known as: FLOMAX Take 0.4 mg by mouth daily.   tiZANidine 2 MG tablet Commonly known as: ZANAFLEX Take 4 mg by mouth at bedtime.   vitamin B-12 1000 MCG tablet Commonly known as: CYANOCOBALAMIN Take 1,000 mcg by mouth daily.   VITAMIN C PO Take 500 mg by mouth 2 (two) times daily.   Cholecalciferol 25 MCG (1000 UT) tablet Take 1,000 Units by mouth daily. Take along with a 5000 unit tablet to = 6000 units   Vitamin D (Cholecalciferol) 25 MCG (1000 UT) Tabs Take 5,000 Units by mouth daily. Along with a 1000 unit to = 6000 units       Review of Systems  Immunization History  Administered Date(s) Administered  . Influenza, High Dose Seasonal PF 07/01/2018  . Influenza,inj,Quad PF,6+ Mos 05/20/2013, 05/26/2014, 06/18/2015, 06/18/2016, 06/29/2017, 06/20/2019  . Influenza-Unspecified 06/20/2019  . Moderna SARS-COVID-2 Vaccination 08/19/2019, 10/08/2019  . PPD Test 08/23/2019  . Td 08/18/1998  . Tdap 03/11/2012   Pertinent  Health Maintenance Due  Topic Date Due  . PAP SMEAR-Modifier  Never done  . MAMMOGRAM  Never done  . COLONOSCOPY  Never done  . INFLUENZA VACCINE  03/18/2020   Fall Risk  05/31/2020 04/19/2020  Falls in the past year? 0 0  Risk for fall due to : No Fall Risks No Fall Risks  Follow up Falls evaluation completed Falls evaluation completed   Functional Status Survey:    There were no vitals filed for this visit. There is no height or weight on file to calculate BMI. Physical Exam  Labs reviewed: Recent Labs    10/01/19 2151 10/10/19 0000 02/13/20 0000 02/13/20 0000 04/26/20 0000 06/28/20 0000 07/02/20 0000  NA 143   < > 138   < > 138 131* 137  K 3.4*   < > 3.7   < > 3.9 4.6 5.1   CL 106   < > 103   < > 101 97* 104  CO2 25   < > 19   < > 22 24* 23*  GLUCOSE 116*  --   --   --   --   --   --   BUN 10   < > 8  --  14 26*  --   CREATININE 0.88   < > 0.7   < > 0.7 0.7 0.6  CALCIUM 9.4   < > 9.0   < > 8.8 9.1 8.9   < > =  values in this interval not displayed.   Recent Labs    10/01/19 2151 10/02/19 0000 10/31/19 0000 01/09/20 0000 02/08/20 0000  AST 18   < > 7* 12* 8*  ALT 15   < > 6* 10 3*  ALKPHOS 52   < > 52 58 56  BILITOT 0.7  --   --   --   --   PROT 7.1  --   --   --   --   ALBUMIN 3.7   < > 3.4* 3.1* 3.8   < > = values in this interval not displayed.   Recent Labs    10/01/19 2151 10/10/19 0000 11/03/19 0000 11/03/19 0000 11/10/19 0000 11/10/19 0000 11/21/19 0000 12/13/19 0000 06/28/20 0000 07/02/20 0000 07/04/20 0000  WBC 21.6*   < > 23.4   < > 14.6   < > 8.1   < > 18.6 18.1 17.6  NEUTROABS 18.9*   < > 20  --  12  --  6  --   --   --   --   HGB 11.7*   < > 12.1   < > 10.7*   < > 11.0*   < > 10.5* 10.1* 9.5*  HCT 37.8   < > 38   < > 33*   < > 34*   < > 33* 33* 29*  MCV 86.3  --   --   --   --   --   --   --   --   --   --   PLT 335   < > 335   < > 430*   < > 450*   < > 695* 551* 555*   < > = values in this interval not displayed.   No results found for: TSH No results found for: HGBA1C Lab Results  Component Value Date   CHOL 163 05/23/2020   HDL 51 05/23/2020   LDLCALC 50 05/23/2020   TRIG 309 (A) 05/23/2020    Significant Diagnostic Results in last 30 days:  No results found.  Assessment/Plan There are no diagnoses linked to this encounter.   Family/ staff Communication:   Labs/tests ordered:

## 2020-07-04 NOTE — Progress Notes (Signed)
Location:  [facility] Room Number: 210/W Place of Service:  SNF (31) Provider:  Windell Moulding, AGNP-C  [name], DO  Patient Care Team: [name], DO as PCP - General (Geriatric Medicine) Rehab, [facility] ([place]) [place], Nelda Bucks, NP as Nurse Practitioner (Family Medicine) Carlyle Basques, MD as Consulting Physician (Infectious Diseases) Ala Bent, MD as Referring Physician (Neurology)  Extended Emergency Contact Information Primary Emergency Contact: [name] Mobile Phone: [phone] Relation: Other Secondary Emergency Contact: [name] Mobile Phone: [phone] Relation: Other  Goals of care: Advanced Directive information Advanced Directives 07/04/2020  Does Patient Have a Medical Advance Directive? Yes  Type of Advance Directive -  Does patient want to make changes to medical advance directive? No - Patient declined  Pre-existing out of facility DNR order (yellow form or pink MOST form) -     Chief Complaint  Patient presents with  . Acute Visit    HPI:  Pt is a 64 y.o. female seen today for an acute visit for elevated labs.   She is a resident of [facility]. Seen today at beside. She is drowsy, but happy to have company in the room. She has been on isolation precautions due to recurrent colitis. States she is not having many episodes of diarrhea at this time.   Her pain medication was increased a few days ago and she is having better pain control, especially during wound care of her sacral area.   Her main issue is with the nursing staff of the facility. Upset her pitcher is not filled with fresh water a few times a day. She is concerned about becoming dehydrated. Small cans of soda are present near her bed. Her lunch tray has a small tea on it. She is upset that the facility continues to give her drinks high in sugar and will dehydrate her even more.     She is very tearful during the whole encounter. States she is lonely and not cared for. Praises the PT staff for looking out for her and making sure she is cared for.   PT reports for is more drosy since pain medication is given more often.   Wound nurse concerned unstageable pressure injury over sacral area has increased with necrotic tissue and has a odor.    Past Medical History:  Diagnosis Date  . Actinic keratosis   . BCC (basal cell carcinoma of skin)   . Closed fracture of second metatarsal bone of right foot   . Closed fracture of third metatarsal bone of right foot with routine healing   . IBS (irritable bowel syndrome)   . Multiple sclerosis ([place])   . Neurogenic bladder   . Rectal polyp    Tubular adenoma  . Recurrent UTI   . SCC (squamous cell carcinoma)    Past Surgical History:  Procedure Laterality Date  . BOTOX INJECTION    . BREAST BIOPSY    . COLONOSCOPY    . SKIN CANCER EXCISION      No Known Allergies  Outpatient Encounter Medications as of 07/04/2020  Medication Sig  . acetaminophen (TYLENOL) 325 MG tablet Take 325 mg by mouth every 4 (four) hours as needed for mild pain, fever or headache.   . Amino Acids-Protein Hydrolys (FEEDING SUPPLEMENT, PRO-STAT SUGAR FREE 64,) LIQD Take 30 mLs by mouth in the morning and at bedtime.  . Ascorbic Acid (VITAMIN C PO) Take 500 mg  by mouth 2 (two) times daily.  . baclofen (LIORESAL) 10 MG tablet Take 30 mg by mouth at bedtime. Take 3 tablets to = 30 mg  . baclofen (LIORESAL) 20 MG tablet Take 20 mg by mouth 3 (three) times daily.  . cetirizine (ZYRTEC) 5 MG tablet Take 1 tablet (5 mg total) by mouth at bedtime.  . Cholecalciferol 25 MCG (1000 UT) tablet Take 1,000 Units by mouth daily. Take along with a 5000 unit tablet to = 6000 units  . dicyclomine (BENTYL) 20 MG tablet Take 20 mg by mouth in the morning, at noon, in the evening, and at bedtime.   Marland Kitchen FLUoxetine (PROZAC) 40 MG capsule Take 1 capsule (40 mg total) by  mouth daily.  Marland Kitchen HYDROcodone-acetaminophen (NORCO/VICODIN) 5-325 MG tablet Take 1 tablet by mouth every 4 (four) hours. While awake  . hydrocortisone cream 1 % Apply 1 application topically every 12 (twelve) hours as needed for itching.   . Lactobacillus Rhamnosus, GG, (CULTURELLE) CAPS Take by mouth. 10B Cell Capsule  . LORazepam (ATIVAN) 0.5 MG tablet Take 0.5 mg by mouth daily as needed (Panic attack).  . Menthol, Topical Analgesic, (BIOFREEZE EX) Apply 5 % topically in the morning, at noon, in the evening, and at bedtime. Apply to Left Shoulder.  . mirtazapine (REMERON) 7.5 MG tablet Take 7.5 mg by mouth at bedtime.  . NON FORMULARY Magic Cup with L/D meals d/t weight loss.  . Nutritional Supplements (ENSURE CLEAR PO) Take by mouth in the morning and at bedtime. D/t weight loss and wound healing  . Nutritional Supplements (JUVEN PO) Take 1 each by mouth in the morning and at bedtime.  Derrill Memo ON 07/06/2020] ondansetron (ZOFRAN) 4 MG tablet Take 4 mg by mouth in the morning, at noon, and at bedtime. Nausea and Vomiting  . potassium chloride (KLOR-CON) 10 MEQ tablet Take 10 mEq by mouth daily.   . pravastatin (PRAVACHOL) 40 MG tablet Take 40 mg by mouth daily.  . tamsulosin (FLOMAX) 0.4 MG CAPS capsule Take 0.4 mg by mouth daily.  Marland Kitchen tiZANidine (ZANAFLEX) 2 MG tablet Take 4 mg by mouth at bedtime.   . vitamin B-12 (CYANOCOBALAMIN) 1000 MCG tablet Take 1,000 mcg by mouth daily.  . Vitamin D, Cholecalciferol, 25 MCG (1000 UT) TABS Take 5,000 Units by mouth daily. Along with a 1000 unit to = 6000 units   No facility-administered encounter medications on file as of 07/04/2020.    Review of Systems  Constitutional: Negative for activity change, appetite change and fever.  Respiratory: Negative for cough and shortness of breath.   Cardiovascular: Negative for chest pain and leg swelling.  Gastrointestinal: Positive for diarrhea. Negative for abdominal pain.  Genitourinary: Negative for dysuria  and hematuria.  Musculoskeletal: Positive for arthralgias and myalgias.       Contracted lower extremities  Skin:       Large sacral wound with bone exposure  Neurological: Positive for weakness.  Psychiatric/Behavioral: Positive for dysphoric mood. The patient is nervous/anxious.     Immunization History  Administered Date(s) Administered  . Influenza, High Dose Seasonal PF 07/01/2018  . Influenza,inj,Quad PF,6+ Mos 05/20/2013, 05/26/2014, 06/18/2015, 06/18/2016, 06/29/2017, 06/20/2019  . Influenza-Unspecified 06/20/2019  . Moderna SARS-COVID-2 Vaccination 08/19/2019, 10/08/2019  . PPD Test 08/23/2019  . Td 08/18/1998  . Tdap 03/11/2012   Pertinent  Health Maintenance Due  Topic Date Due  . PAP SMEAR-Modifier  Never done  . MAMMOGRAM  Never done  . COLONOSCOPY  Never done  . INFLUENZA  VACCINE  03/18/2020   Fall Risk  05/31/2020 04/19/2020  Falls in the past year? 0 0  Risk for fall due to : No Fall Risks No Fall Risks  Follow up Falls evaluation completed Falls evaluation completed   Functional Status Survey:    Vitals:   07/04/20 1606  BP: 126/74  Pulse: 78  Resp: 18  Temp: 98 F (36.7 C)  SpO2: 98%  Weight: 135 lb (61.2 kg)  Height: 5\' 5"  (1.651 m)   Body mass index is 22.47 kg/m. Physical Exam Vitals and nursing note reviewed.  Constitutional:      Appearance: Normal appearance. She is normal weight.  HENT:     Head: Normocephalic.  Cardiovascular:     Rate and Rhythm: Normal rate and regular rhythm.     Pulses: Normal pulses.     Heart sounds: Normal heart sounds. No murmur heard.   Pulmonary:     Effort: Pulmonary effort is normal. No respiratory distress.     Breath sounds: Normal breath sounds. No wheezing.  Abdominal:     General: Abdomen is flat. There is no distension.     Palpations: Abdomen is soft.     Tenderness: There is no abdominal tenderness.     Comments: Hyperactive bowel sounds  Skin:    General: Skin is cool and dry.      Capillary Refill: Capillary refill takes less than 2 seconds.       Neurological:     Mental Status: She is alert.  Psychiatric:        Attention and Perception: Attention and perception normal.        Mood and Affect: Mood is depressed. Affect is tearful.        Speech: Speech normal.        Thought Content: Thought content normal.        Cognition and Memory: Cognition normal.        Judgment: Judgment normal.     Labs reviewed: Recent Labs    10/01/19 2151 10/10/19 0000 02/13/20 0000 02/13/20 0000 04/26/20 0000 06/28/20 0000 07/02/20 0000  NA 143   < > 138   < > 138 131* 137  K 3.4*   < > 3.7   < > 3.9 4.6 5.1  CL 106   < > 103   < > 101 97* 104  CO2 25   < > 19   < > 22 24* 23*  GLUCOSE 116*  --   --   --   --   --   --   BUN 10   < > 8  --  14 26*  --   CREATININE 0.88   < > 0.7   < > 0.7 0.7 0.6  CALCIUM 9.4   < > 9.0   < > 8.8 9.1 8.9   < > = values in this interval not displayed.   Recent Labs    10/01/19 2151 10/02/19 0000 10/31/19 0000 01/09/20 0000 02/08/20 0000  AST 18   < > 7* 12* 8*  ALT 15   < > 6* 10 3*  ALKPHOS 52   < > 52 58 56  BILITOT 0.7  --   --   --   --   PROT 7.1  --   --   --   --   ALBUMIN 3.7   < > 3.4* 3.1* 3.8   < > = values in this interval not displayed.   Recent  Labs    10/01/19 2151 10/10/19 0000 11/03/19 0000 11/03/19 0000 11/10/19 0000 11/10/19 0000 11/21/19 0000 12/13/19 0000 06/28/20 0000 07/02/20 0000 07/04/20 0000  WBC 21.6*   < > 23.4   < > 14.6   < > 8.1   < > 18.6 18.1 17.6  NEUTROABS 18.9*   < > 20  --  12  --  6  --   --   --   --   HGB 11.7*   < > 12.1   < > 10.7*   < > 11.0*   < > 10.5* 10.1* 9.5*  HCT 37.8   < > 38   < > 33*   < > 34*   < > 33* 33* 29*  MCV 86.3  --   --   --   --   --   --   --   --   --   --   PLT 335   < > 335   < > 430*   < > 450*   < > 695* 551* 555*   < > = values in this interval not displayed.   No results found for: TSH No results found for: HGBA1C Lab Results  Component  Value Date   CHOL 163 05/23/2020   HDL 51 05/23/2020   LDLCALC 50 05/23/2020   TRIG 309 (A) 05/23/2020    Significant Diagnostic Results in last 30 days:  No results found.  Assessment/Plan 1. Pressure injury of buttock, unstageable, unspecified laterality ([place]) - cbc elevated at 17.6, sed rate and CRP also elevated - on exam eschar and slough have possibly increased, as well as odor, concerned it is becoming infected - bone biopsy to be done this Monday by wound NP to r/o osteomyelitis - x-ray of area inconclusive of sacral bone involvement - after discussion with Dr. Mariea Clonts, picc line to be placed -unasyn 3g IV Q6 for 4 weeks to be started - cbc/diff in 1 week    2. 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 Mariea Clonts, will restart oral Lucianne Lei - Vancomycin 125 mg PO BID x 4 weeks - will address nursing staff about importance of hydration and keeping pitcher filled with fresh water and to avoid drinks high in sugar and caffeine - vanc trough in 1 week - bmp in 1 week    Family/ staff Communication: Plan discussed with patient and nursing facility  Labs/tests ordered:  Cbc/diff, bmp, van trough-1 week

## 2020-07-04 NOTE — Progress Notes (Deleted)
Location:    [facility] Room Number: 210/W Place of Service:  SNF (31) Provider: Windell Moulding NP   [name], DO  Patient Care Team: [name], DO as PCP - General (Geriatric Medicine) Rehab, [place] ([place]) [place], Nelda Bucks, NP as Nurse Practitioner (Family Medicine) Carlyle Basques, MD as Consulting Physician (Infectious Diseases) Ala Bent, MD as Referring Physician (Neurology)  Extended Emergency Contact Information Primary Emergency Contact: [name] Mobile Phone: [phone] Relation: Other Secondary Emergency Contact: [name] Mobile Phone: [phone] Relation: Other  Code Status:  Full Code Goals of care: Advanced Directive information Advanced Directives 07/04/2020  Does Patient Have a Medical Advance Directive? Yes  Type of Advance Directive -  Does patient want to make changes to medical advance directive? No - Patient declined  Pre-existing out of facility DNR order (yellow form or pink MOST form) -     Chief Complaint  Patient presents with  . Acute Visit    Drowsiness and Confusion    HPI:  Pt is a 64 y.o. female seen today for an acute visit for    Past Medical History:  Diagnosis Date  . Actinic keratosis   . BCC (basal cell carcinoma of skin)   . Closed fracture of second metatarsal bone of right foot   . Closed fracture of third metatarsal bone of right foot with routine healing   . IBS (irritable bowel syndrome)   . Multiple sclerosis ([place])   . Neurogenic bladder   . Rectal polyp    Tubular adenoma  . Recurrent UTI   . SCC (squamous cell carcinoma)    Past Surgical History:  Procedure Laterality Date  . BOTOX INJECTION    . BREAST BIOPSY    . COLONOSCOPY    . SKIN CANCER EXCISION      No Known Allergies  Allergies as of 07/04/2020   No Known Allergies     Medication List       Accurate as of July 04, 2020  2:33 PM. If you have any  questions, ask your nurse or doctor.        STOP taking these medications   traMADol 50 MG tablet Commonly known as: ULTRAM Stopped by: Yvonna Alanis, NP     TAKE these medications   acetaminophen 325 MG tablet Commonly known as: TYLENOL Take 325 mg by mouth every 4 (four) hours as needed for mild pain, fever or headache.   baclofen 10 MG tablet Commonly known as: LIORESAL Take 30 mg by mouth at bedtime. Take 3 tablets to = 30 mg   baclofen 20 MG tablet Commonly known as: LIORESAL Take 20 mg by mouth 3 (three) times daily.   BIOFREEZE EX Apply 5 % topically in the morning, at noon, in the evening, and at bedtime. Apply to Left Shoulder.   cetirizine 5 MG tablet Commonly known as: ZYRTEC Take 1 tablet (5 mg total) by mouth at bedtime.   Culturelle Caps Take by mouth. 10B Cell Capsule   dicyclomine 20 MG tablet Commonly known as: BENTYL Take 20 mg by mouth in the morning, at noon, in the evening, and at bedtime.   feeding supplement (PRO-STAT SUGAR FREE 64) Liqd Take 30 mLs by mouth in the morning and at bedtime.   FLUoxetine 40 MG capsule Commonly known as: PROZAC Take 1 capsule (40 mg total) by mouth daily.   HYDROcodone-acetaminophen 5-325 MG tablet Commonly known as: NORCO/VICODIN  Take 1 tablet by mouth every 4 (four) hours. While awake   hydrocortisone cream 1 % Apply 1 application topically every 12 (twelve) hours as needed for itching.   JUVEN PO Take 1 each by mouth in the morning and at bedtime.   ENSURE CLEAR PO Take by mouth in the morning and at bedtime. D/t weight loss and wound healing   LORazepam 0.5 MG tablet Commonly known as: ATIVAN Take 0.5 mg by mouth daily as needed (Panic attack).   mirtazapine 7.5 MG tablet Commonly known as: REMERON Take 7.5 mg by mouth at bedtime.   NON FORMULARY Magic Cup with L/D meals d/t weight loss.   ondansetron 4 MG tablet Commonly known as: ZOFRAN Take 4 mg by mouth in the morning, at noon, and at  bedtime. Nausea and Vomiting Start taking on: July 06, 2020   potassium chloride 10 MEQ tablet Commonly known as: KLOR-CON Take 10 mEq by mouth daily.   pravastatin 40 MG tablet Commonly known as: PRAVACHOL Take 40 mg by mouth daily.   tamsulosin 0.4 MG Caps capsule Commonly known as: FLOMAX Take 0.4 mg by mouth daily.   tiZANidine 2 MG tablet Commonly known as: ZANAFLEX Take 4 mg by mouth at bedtime.   vitamin B-12 1000 MCG tablet Commonly known as: CYANOCOBALAMIN Take 1,000 mcg by mouth daily.   VITAMIN C PO Take 500 mg by mouth 2 (two) times daily.   Cholecalciferol 25 MCG (1000 UT) tablet Take 1,000 Units by mouth daily. Take along with a 5000 unit tablet to = 6000 units   Vitamin D (Cholecalciferol) 25 MCG (1000 UT) Tabs Take 5,000 Units by mouth daily. Along with a 1000 unit to = 6000 units       Review of Systems  Immunization History  Administered Date(s) Administered  . Influenza, High Dose Seasonal PF 07/01/2018  . Influenza,inj,Quad PF,6+ Mos 05/20/2013, 05/26/2014, 06/18/2015, 06/18/2016, 06/29/2017, 06/20/2019  . Influenza-Unspecified 06/20/2019  . Moderna SARS-COVID-2 Vaccination 08/19/2019, 10/08/2019  . PPD Test 08/23/2019  . Td 08/18/1998  . Tdap 03/11/2012   Pertinent  Health Maintenance Due  Topic Date Due  . PAP SMEAR-Modifier  Never done  . MAMMOGRAM  Never done  . COLONOSCOPY  Never done  . INFLUENZA VACCINE  03/18/2020   Fall Risk  05/31/2020 04/19/2020  Falls in the past year? 0 0  Risk for fall due to : No Fall Risks No Fall Risks  Follow up Falls evaluation completed Falls evaluation completed   Functional Status Survey:    Vitals:   07/04/20 1413  BP: 121/72  Pulse: 84  Resp: 20  Temp: 98.1 F (36.7 C)  SpO2: 98%  Weight: 135 lb (61.2 kg)  Height: 5\' 5"  (1.651 m)   Body mass index is 22.47 kg/m. Physical Exam  Labs reviewed: Recent Labs    10/01/19 2151 10/10/19 0000 02/13/20 0000 02/13/20 0000  04/26/20 0000 06/28/20 0000 07/02/20 0000  NA 143   < > 138   < > 138 131* 137  K 3.4*   < > 3.7   < > 3.9 4.6 5.1  CL 106   < > 103   < > 101 97* 104  CO2 25   < > 19   < > 22 24* 23*  GLUCOSE 116*  --   --   --   --   --   --   BUN 10   < > 8  --  14 26*  --  CREATININE 0.88   < > 0.7   < > 0.7 0.7 0.6  CALCIUM 9.4   < > 9.0   < > 8.8 9.1 8.9   < > = values in this interval not displayed.   Recent Labs    10/01/19 2151 10/02/19 0000 10/31/19 0000 01/09/20 0000 02/08/20 0000  AST 18   < > 7* 12* 8*  ALT 15   < > 6* 10 3*  ALKPHOS 52   < > 52 58 56  BILITOT 0.7  --   --   --   --   PROT 7.1  --   --   --   --   ALBUMIN 3.7   < > 3.4* 3.1* 3.8   < > = values in this interval not displayed.   Recent Labs    10/01/19 2151 10/10/19 0000 11/03/19 0000 11/03/19 0000 11/10/19 0000 11/10/19 0000 11/21/19 0000 12/13/19 0000 06/28/20 0000 07/02/20 0000 07/04/20 0000  WBC 21.6*   < > 23.4   < > 14.6   < > 8.1   < > 18.6 18.1 17.6  NEUTROABS 18.9*   < > 20  --  12  --  6  --   --   --   --   HGB 11.7*   < > 12.1   < > 10.7*   < > 11.0*   < > 10.5* 10.1* 9.5*  HCT 37.8   < > 38   < > 33*   < > 34*   < > 33* 33* 29*  MCV 86.3  --   --   --   --   --   --   --   --   --   --   PLT 335   < > 335   < > 430*   < > 450*   < > 695* 551* 555*   < > = values in this interval not displayed.   No results found for: TSH No results found for: HGBA1C Lab Results  Component Value Date   CHOL 163 05/23/2020   HDL 51 05/23/2020   LDLCALC 50 05/23/2020   TRIG 309 (A) 05/23/2020    Significant Diagnostic Results in last 30 days:  No results found.  Assessment/Plan There are no diagnoses linked to this encounter.   Family/ staff Communication:   Labs/tests ordered:

## 2020-07-04 NOTE — Progress Notes (Signed)
This encounter was created in error - please disregard.

## 2020-07-05 ENCOUNTER — Inpatient Hospital Stay: Admission: RE | Admit: 2020-07-05 | Payer: Medicare Other | Source: Ambulatory Visit

## 2020-07-07 ENCOUNTER — Inpatient Hospital Stay (HOSPITAL_COMMUNITY)
Admission: EM | Admit: 2020-07-07 | Discharge: 2020-07-13 | DRG: 539 | Disposition: A | Discharge status: Skilled Nursing Facility | Payer: Medicare Other | Source: Skilled Nursing Facility | Attending: Internal Medicine | Admitting: Internal Medicine

## 2020-07-07 ENCOUNTER — Emergency Department (HOSPITAL_COMMUNITY): Payer: Medicare Other

## 2020-07-07 ENCOUNTER — Encounter (HOSPITAL_COMMUNITY): Payer: Self-pay | Admitting: Emergency Medicine

## 2020-07-07 DIAGNOSIS — T148XXA Other injury of unspecified body region, initial encounter: Secondary | ICD-10-CM | POA: Diagnosis not present

## 2020-07-07 DIAGNOSIS — N320 Bladder-neck obstruction: Secondary | ICD-10-CM | POA: Diagnosis present

## 2020-07-07 DIAGNOSIS — Z85828 Personal history of other malignant neoplasm of skin: Secondary | ICD-10-CM

## 2020-07-07 DIAGNOSIS — L89154 Pressure ulcer of sacral region, stage 4: Secondary | ICD-10-CM

## 2020-07-07 DIAGNOSIS — N319 Neuromuscular dysfunction of bladder, unspecified: Secondary | ICD-10-CM | POA: Diagnosis present

## 2020-07-07 DIAGNOSIS — N1339 Other hydronephrosis: Secondary | ICD-10-CM | POA: Diagnosis present

## 2020-07-07 DIAGNOSIS — G9340 Encephalopathy, unspecified: Secondary | ICD-10-CM | POA: Diagnosis not present

## 2020-07-07 DIAGNOSIS — Z7189 Other specified counseling: Secondary | ICD-10-CM | POA: Diagnosis not present

## 2020-07-07 DIAGNOSIS — E8809 Other disorders of plasma-protein metabolism, not elsewhere classified: Secondary | ICD-10-CM | POA: Diagnosis present

## 2020-07-07 DIAGNOSIS — E538 Deficiency of other specified B group vitamins: Secondary | ICD-10-CM | POA: Diagnosis present

## 2020-07-07 DIAGNOSIS — I959 Hypotension, unspecified: Secondary | ICD-10-CM | POA: Diagnosis present

## 2020-07-07 DIAGNOSIS — R609 Edema, unspecified: Secondary | ICD-10-CM | POA: Diagnosis present

## 2020-07-07 DIAGNOSIS — E876 Hypokalemia: Secondary | ICD-10-CM

## 2020-07-07 DIAGNOSIS — T402X5A Adverse effect of other opioids, initial encounter: Secondary | ICD-10-CM | POA: Diagnosis present

## 2020-07-07 DIAGNOSIS — N390 Urinary tract infection, site not specified: Secondary | ICD-10-CM | POA: Diagnosis present

## 2020-07-07 DIAGNOSIS — D509 Iron deficiency anemia, unspecified: Secondary | ICD-10-CM | POA: Diagnosis present

## 2020-07-07 DIAGNOSIS — D649 Anemia, unspecified: Secondary | ICD-10-CM | POA: Diagnosis not present

## 2020-07-07 DIAGNOSIS — I10 Essential (primary) hypertension: Secondary | ICD-10-CM | POA: Diagnosis present

## 2020-07-07 DIAGNOSIS — M869 Osteomyelitis, unspecified: Secondary | ICD-10-CM | POA: Diagnosis not present

## 2020-07-07 DIAGNOSIS — Z2239 Carrier of other specified bacterial diseases: Secondary | ICD-10-CM

## 2020-07-07 DIAGNOSIS — M4648 Discitis, unspecified, sacral and sacrococcygeal region: Secondary | ICD-10-CM | POA: Diagnosis not present

## 2020-07-07 DIAGNOSIS — F32A Depression, unspecified: Secondary | ICD-10-CM | POA: Diagnosis present

## 2020-07-07 DIAGNOSIS — D638 Anemia in other chronic diseases classified elsewhere: Secondary | ICD-10-CM | POA: Diagnosis present

## 2020-07-07 DIAGNOSIS — Z79899 Other long term (current) drug therapy: Secondary | ICD-10-CM

## 2020-07-07 DIAGNOSIS — K5900 Constipation, unspecified: Secondary | ICD-10-CM

## 2020-07-07 DIAGNOSIS — L89121 Pressure ulcer of left upper back, stage 1: Secondary | ICD-10-CM | POA: Diagnosis present

## 2020-07-07 DIAGNOSIS — L89611 Pressure ulcer of right heel, stage 1: Secondary | ICD-10-CM | POA: Diagnosis present

## 2020-07-07 DIAGNOSIS — Z20822 Contact with and (suspected) exposure to covid-19: Secondary | ICD-10-CM | POA: Diagnosis present

## 2020-07-07 DIAGNOSIS — Z87440 Personal history of urinary (tract) infections: Secondary | ICD-10-CM

## 2020-07-07 DIAGNOSIS — M8618 Other acute osteomyelitis, other site: Secondary | ICD-10-CM | POA: Diagnosis not present

## 2020-07-07 DIAGNOSIS — K589 Irritable bowel syndrome without diarrhea: Secondary | ICD-10-CM | POA: Diagnosis present

## 2020-07-07 DIAGNOSIS — G928 Other toxic encephalopathy: Secondary | ICD-10-CM | POA: Diagnosis present

## 2020-07-07 DIAGNOSIS — A0471 Enterocolitis due to Clostridium difficile, recurrent: Secondary | ICD-10-CM | POA: Diagnosis present

## 2020-07-07 DIAGNOSIS — E785 Hyperlipidemia, unspecified: Secondary | ICD-10-CM | POA: Diagnosis present

## 2020-07-07 DIAGNOSIS — F13921 Sedative, hypnotic or anxiolytic use, unspecified with intoxication delirium: Secondary | ICD-10-CM | POA: Diagnosis not present

## 2020-07-07 DIAGNOSIS — Z8249 Family history of ischemic heart disease and other diseases of the circulatory system: Secondary | ICD-10-CM

## 2020-07-07 DIAGNOSIS — R52 Pain, unspecified: Secondary | ICD-10-CM

## 2020-07-07 DIAGNOSIS — G35B0 Primary progressive multiple sclerosis, unspecified: Secondary | ICD-10-CM | POA: Diagnosis present

## 2020-07-07 DIAGNOSIS — R410 Disorientation, unspecified: Secondary | ICD-10-CM | POA: Diagnosis not present

## 2020-07-07 DIAGNOSIS — B965 Pseudomonas (aeruginosa) (mallei) (pseudomallei) as the cause of diseases classified elsewhere: Secondary | ICD-10-CM | POA: Diagnosis present

## 2020-07-07 DIAGNOSIS — G35 Multiple sclerosis: Secondary | ICD-10-CM | POA: Diagnosis present

## 2020-07-07 DIAGNOSIS — M86159 Other acute osteomyelitis, unspecified femur: Secondary | ICD-10-CM | POA: Diagnosis not present

## 2020-07-07 DIAGNOSIS — G9341 Metabolic encephalopathy: Secondary | ICD-10-CM | POA: Diagnosis present

## 2020-07-07 DIAGNOSIS — M7989 Other specified soft tissue disorders: Secondary | ICD-10-CM | POA: Diagnosis not present

## 2020-07-07 DIAGNOSIS — M4628 Osteomyelitis of vertebra, sacral and sacrococcygeal region: Principal | ICD-10-CM

## 2020-07-07 DIAGNOSIS — Z515 Encounter for palliative care: Secondary | ICD-10-CM

## 2020-07-07 DIAGNOSIS — Z7401 Bed confinement status: Secondary | ICD-10-CM

## 2020-07-07 DIAGNOSIS — L8990 Pressure ulcer of unspecified site, unspecified stage: Secondary | ICD-10-CM | POA: Insufficient documentation

## 2020-07-07 LAB — TYPE AND SCREEN
ABO/RH(D): O POS
Antibody Screen: NEGATIVE

## 2020-07-07 LAB — CBC WITH DIFFERENTIAL/PLATELET
Abs Immature Granulocytes: 0.09 10*3/uL — ABNORMAL HIGH (ref 0.00–0.07)
Abs Immature Granulocytes: 0.14 10*3/uL — ABNORMAL HIGH (ref 0.00–0.07)
Basophils Absolute: 0.1 10*3/uL (ref 0.0–0.1)
Basophils Absolute: 0.1 10*3/uL (ref 0.0–0.1)
Basophils Relative: 0 %
Basophils Relative: 0 %
Eosinophils Absolute: 0.3 10*3/uL (ref 0.0–0.5)
Eosinophils Absolute: 0.3 10*3/uL (ref 0.0–0.5)
Eosinophils Relative: 2 %
Eosinophils Relative: 2 %
HCT: 26.4 % — ABNORMAL LOW (ref 36.0–46.0)
HCT: 29.5 % — ABNORMAL LOW (ref 36.0–46.0)
Hemoglobin: 7.8 g/dL — ABNORMAL LOW (ref 12.0–15.0)
Hemoglobin: 8.9 g/dL — ABNORMAL LOW (ref 12.0–15.0)
Immature Granulocytes: 1 %
Immature Granulocytes: 1 %
Lymphocytes Relative: 12 %
Lymphocytes Relative: 12 %
Lymphs Abs: 1.7 10*3/uL (ref 0.7–4.0)
Lymphs Abs: 2.2 10*3/uL (ref 0.7–4.0)
MCH: 25.3 pg — ABNORMAL LOW (ref 26.0–34.0)
MCH: 25.7 pg — ABNORMAL LOW (ref 26.0–34.0)
MCHC: 29.5 g/dL — ABNORMAL LOW (ref 30.0–36.0)
MCHC: 30.2 g/dL (ref 30.0–36.0)
MCV: 85.7 fL (ref 80.0–100.0)
MCV: 85.3 fL (ref 80.0–100.0)
Monocytes Absolute: 0.9 10*3/uL (ref 0.1–1.0)
Monocytes Absolute: 1.1 10*3/uL — ABNORMAL HIGH (ref 0.1–1.0)
Monocytes Relative: 6 %
Monocytes Relative: 6 %
Neutro Abs: 11.6 10*3/uL — ABNORMAL HIGH (ref 1.7–7.7)
Neutro Abs: 14.3 10*3/uL — ABNORMAL HIGH (ref 1.7–7.7)
Neutrophils Relative %: 79 %
Neutrophils Relative %: 79 %
Platelets: 465 10*3/uL — ABNORMAL HIGH (ref 150–400)
Platelets: 630 10*3/uL — ABNORMAL HIGH (ref 150–400)
RBC: 3.08 MIL/uL — ABNORMAL LOW (ref 3.87–5.11)
RBC: 3.46 MIL/uL — ABNORMAL LOW (ref 3.87–5.11)
RDW: 18 % — ABNORMAL HIGH (ref 11.5–15.5)
RDW: 18.1 % — ABNORMAL HIGH (ref 11.5–15.5)
WBC: 14.6 10*3/uL — ABNORMAL HIGH (ref 4.0–10.5)
WBC: 18.1 10*3/uL — ABNORMAL HIGH (ref 4.0–10.5)
nRBC: 0 % (ref 0.0–0.2)
nRBC: 0 % (ref 0.0–0.2)

## 2020-07-07 LAB — BLOOD GAS, ARTERIAL
Acid-Base Excess: 2.6 mmol/L — ABNORMAL HIGH (ref 0.0–2.0)
Bicarbonate: 26.9 mmol/L (ref 20.0–28.0)
Drawn by: 29503
O2 Saturation: 94.8 %
Patient temperature: 97.8
pCO2 arterial: 42.1 mmHg (ref 32.0–48.0)
pH, Arterial: 7.42 (ref 7.350–7.450)
pO2, Arterial: 74.4 mmHg — ABNORMAL LOW (ref 83.0–108.0)

## 2020-07-07 LAB — URINALYSIS, ROUTINE W REFLEX MICROSCOPIC
Bilirubin Urine: NEGATIVE
Glucose, UA: NEGATIVE mg/dL
Hgb urine dipstick: NEGATIVE
Ketones, ur: NEGATIVE mg/dL
Nitrite: NEGATIVE
Protein, ur: 100 mg/dL — AB
Specific Gravity, Urine: 1.016 (ref 1.005–1.030)
WBC, UA: >50 WBC/hpf — ABNORMAL HIGH (ref 0–5)
pH: 6 (ref 5.0–8.0)

## 2020-07-07 LAB — COMPREHENSIVE METABOLIC PANEL
ALT: 7 U/L (ref 0–44)
ALT: 7 U/L (ref 0–44)
AST: 10 U/L — ABNORMAL LOW (ref 15–41)
AST: 13 U/L — ABNORMAL LOW (ref 15–41)
Albumin: 1.6 g/dL — ABNORMAL LOW (ref 3.5–5.0)
Albumin: 1.8 g/dL — ABNORMAL LOW (ref 3.5–5.0)
Alkaline Phosphatase: 75 U/L (ref 38–126)
Alkaline Phosphatase: 84 U/L (ref 38–126)
Anion gap: 8 (ref 5–15)
Anion gap: 10 (ref 5–15)
BUN: 18 mg/dL (ref 8–23)
BUN: 15 mg/dL (ref 8–23)
CO2: 26 mmol/L (ref 22–32)
CO2: 26 mmol/L (ref 22–32)
Calcium: 7.8 mg/dL — ABNORMAL LOW (ref 8.9–10.3)
Calcium: 8.1 mg/dL — ABNORMAL LOW (ref 8.9–10.3)
Chloride: 105 mmol/L (ref 98–111)
Chloride: 105 mmol/L (ref 98–111)
Creatinine, Ser: 0.62 mg/dL (ref 0.44–1.00)
Creatinine, Ser: 0.58 mg/dL (ref 0.44–1.00)
GFR, Estimated: >60 mL/min (ref >60)
GFR, Estimated: >60 mL/min (ref >60)
Glucose, Bld: 88 mg/dL (ref 70–99)
Glucose, Bld: 73 mg/dL (ref 70–99)
Potassium: 4 mmol/L (ref 3.5–5.1)
Potassium: 3.6 mmol/L (ref 3.5–5.1)
Sodium: 139 mmol/L (ref 135–145)
Sodium: 141 mmol/L (ref 135–145)
Total Bilirubin: 0.5 mg/dL (ref 0.3–1.2)
Total Bilirubin: 0.5 mg/dL (ref 0.3–1.2)
Total Protein: 4.5 g/dL — ABNORMAL LOW (ref 6.5–8.1)
Total Protein: 5.1 g/dL — ABNORMAL LOW (ref 6.5–8.1)

## 2020-07-07 LAB — RESP PANEL BY RT-PCR (FLU A&B, COVID) ARPGX2
Influenza A by PCR: NEGATIVE
Influenza B by PCR: NEGATIVE
SARS Coronavirus 2 by RT PCR: NEGATIVE

## 2020-07-07 LAB — RETICULOCYTES
Immature Retic Fract: 18 % — ABNORMAL HIGH (ref 2.3–15.9)
RBC.: 3.43 MIL/uL — ABNORMAL LOW (ref 3.87–5.11)
Retic Count, Absolute: 77.2 10*3/uL (ref 19.0–186.0)
Retic Ct Pct: 2.3 % (ref 0.4–3.1)

## 2020-07-07 LAB — AMMONIA: Ammonia: 13 umol/L (ref 9–35)

## 2020-07-07 LAB — IRON AND TIBC
Iron: 19 ug/dL — ABNORMAL LOW (ref 28–170)
Saturation Ratios: 16 % (ref 10.4–31.8)
TIBC: 120 ug/dL — ABNORMAL LOW (ref 250–450)
UIBC: 101 ug/dL

## 2020-07-07 LAB — APTT: aPTT: 35 seconds (ref 24–36)

## 2020-07-07 LAB — VITAMIN B12: Vitamin B-12: 2488 pg/mL — ABNORMAL HIGH (ref 180–914)

## 2020-07-07 LAB — RAPID URINE DRUG SCREEN, HOSP PERFORMED
Amphetamines: NOT DETECTED
Barbiturates: NOT DETECTED
Benzodiazepines: NOT DETECTED
Cocaine: NOT DETECTED
Opiates: POSITIVE — AB
Tetrahydrocannabinol: NOT DETECTED

## 2020-07-07 LAB — PROTIME-INR
INR: 1.4 — ABNORMAL HIGH (ref 0.8–1.2)
Prothrombin Time: 16.6 seconds — ABNORMAL HIGH (ref 11.4–15.2)

## 2020-07-07 LAB — HIV ANTIBODY (ROUTINE TESTING W REFLEX): HIV Screen 4th Generation wRfx: NONREACTIVE

## 2020-07-07 LAB — LACTIC ACID, PLASMA: Lactic Acid, Venous: 1.3 mmol/L (ref 0.5–1.9)

## 2020-07-07 LAB — FOLATE: Folate: 4 ng/mL — ABNORMAL LOW (ref >5.9)

## 2020-07-07 LAB — FERRITIN: Ferritin: 346 ng/mL — ABNORMAL HIGH (ref 11–307)

## 2020-07-07 LAB — ABO/RH: ABO/RH(D): O POS

## 2020-07-07 MED ORDER — LACTATED RINGERS IV SOLN: INTRAVENOUS | Status: AC

## 2020-07-07 MED ORDER — TIZANIDINE HCL 4 MG PO TABS
4.0000 mg | ORAL_TABLET | Freq: Every day | ORAL | Status: DC
  Administered 2020-07-07 – 2020-07-09 (×3): 4 mg via ORAL
  Filled 2020-07-07 (×3): qty 1

## 2020-07-07 MED ORDER — BACLOFEN 20 MG PO TABS
20.0000 mg | ORAL_TABLET | Freq: Three times a day (TID) | ORAL | Status: DC
  Administered 2020-07-07 – 2020-07-13 (×15): 20 mg via ORAL
  Filled 2020-07-07 (×15): qty 1

## 2020-07-07 MED ORDER — FOLIC ACID 1 MG PO TABS
1.0000 mg | ORAL_TABLET | Freq: Every day | ORAL | Status: DC
  Administered 2020-07-07 – 2020-07-13 (×7): 1 mg via ORAL
  Filled 2020-07-07 (×7): qty 1

## 2020-07-07 MED ORDER — DICYCLOMINE HCL 20 MG PO TABS
20.0000 mg | ORAL_TABLET | Freq: Three times a day (TID) | ORAL | Status: DC
  Administered 2020-07-07 – 2020-07-13 (×17): 20 mg via ORAL
  Filled 2020-07-07 (×22): qty 1

## 2020-07-07 MED ORDER — LORATADINE 10 MG PO TABS
10.0000 mg | ORAL_TABLET | Freq: Every day | ORAL | Status: DC
  Administered 2020-07-07 – 2020-07-13 (×7): 10 mg via ORAL
  Filled 2020-07-07 (×7): qty 1

## 2020-07-07 MED ORDER — VITAMIN B-12 1000 MCG PO TABS
1000.0000 ug | ORAL_TABLET | Freq: Every day | ORAL | Status: DC
  Administered 2020-07-07 – 2020-07-13 (×7): 1000 ug via ORAL
  Filled 2020-07-07 (×8): qty 1

## 2020-07-07 MED ORDER — LORAZEPAM 0.5 MG PO TABS
0.5000 mg | ORAL_TABLET | Freq: Every day | ORAL | Status: DC | PRN
  Administered 2020-07-10: 0.5 mg via ORAL
  Filled 2020-07-07: qty 1

## 2020-07-07 MED ORDER — LACTATED RINGERS IV BOLUS (SEPSIS)
1000.0000 mL | Freq: Once | INTRAVENOUS | Status: AC
  Administered 2020-07-07: 1000 mL via INTRAVENOUS

## 2020-07-07 MED ORDER — METRONIDAZOLE 500 MG PO TABS
500.0000 mg | ORAL_TABLET | Freq: Three times a day (TID) | ORAL | Status: DC
  Administered 2020-07-07 – 2020-07-09 (×6): 500 mg via ORAL
  Filled 2020-07-07 (×6): qty 1

## 2020-07-07 MED ORDER — VANCOMYCIN 50 MG/ML ORAL SOLUTION: 125.0000 mg | ORAL | Status: DC

## 2020-07-07 MED ORDER — FERROUS SULFATE 325 (65 FE) MG PO TABS
325.0000 mg | ORAL_TABLET | Freq: Every day | ORAL | Status: DC
  Administered 2020-07-08 – 2020-07-13 (×6): 325 mg via ORAL
  Filled 2020-07-07 (×6): qty 1

## 2020-07-07 MED ORDER — ASCORBIC ACID 500 MG PO TABS
500.0000 mg | ORAL_TABLET | Freq: Two times a day (BID) | ORAL | Status: DC
  Administered 2020-07-07 – 2020-07-13 (×13): 500 mg via ORAL
  Filled 2020-07-07 (×13): qty 1

## 2020-07-07 MED ORDER — VANCOMYCIN 50 MG/ML ORAL SOLUTION: 125.0000 mg | Freq: Two times a day (BID) | ORAL | Status: DC

## 2020-07-07 MED ORDER — MIRTAZAPINE 15 MG PO TABS
7.5000 mg | ORAL_TABLET | Freq: Every day | ORAL | Status: DC
  Administered 2020-07-07 – 2020-07-12 (×6): 7.5 mg via ORAL
  Filled 2020-07-07 (×6): qty 1

## 2020-07-07 MED ORDER — TAMSULOSIN HCL 0.4 MG PO CAPS
0.4000 mg | ORAL_CAPSULE | Freq: Every day | ORAL | Status: DC
  Administered 2020-07-07 – 2020-07-13 (×7): 0.4 mg via ORAL
  Filled 2020-07-07 (×7): qty 1

## 2020-07-07 MED ORDER — ENOXAPARIN SODIUM 40 MG/0.4ML ~~LOC~~ SOLN
40.0000 mg | SUBCUTANEOUS | Status: DC
  Administered 2020-07-07 – 2020-07-13 (×7): 40 mg via SUBCUTANEOUS
  Filled 2020-07-07 (×7): qty 0.4

## 2020-07-07 MED ORDER — CHLORHEXIDINE GLUCONATE CLOTH 2 % EX PADS
6.0000 | MEDICATED_PAD | Freq: Every day | CUTANEOUS | Status: DC
  Administered 2020-07-08 – 2020-07-13 (×6): 6 via TOPICAL

## 2020-07-07 MED ORDER — BACLOFEN 20 MG PO TABS
30.0000 mg | ORAL_TABLET | Freq: Every day | ORAL | Status: DC
  Administered 2020-07-07 – 2020-07-09 (×3): 30 mg via ORAL
  Filled 2020-07-07 (×2): qty 1

## 2020-07-07 MED ORDER — PRAVASTATIN SODIUM 40 MG PO TABS
40.0000 mg | ORAL_TABLET | Freq: Every day | ORAL | Status: DC
  Administered 2020-07-07 – 2020-07-13 (×7): 40 mg via ORAL
  Filled 2020-07-07 (×7): qty 1

## 2020-07-07 MED ORDER — SODIUM CHLORIDE 0.9 % IV SOLN
1.0000 g | INTRAVENOUS | Status: DC
  Filled 2020-07-07: qty 10

## 2020-07-07 MED ORDER — VANCOMYCIN HCL 1250 MG/250ML IV SOLN
1250.0000 mg | Freq: Every day | INTRAVENOUS | Status: DC
  Administered 2020-07-07 – 2020-07-09 (×3): 1250 mg via INTRAVENOUS
  Filled 2020-07-07 (×3): qty 250

## 2020-07-07 MED ORDER — SODIUM CHLORIDE 0.9 % IV SOLN
1.0000 g | Freq: Once | INTRAVENOUS | Status: AC
  Administered 2020-07-07: 1 g via INTRAVENOUS
  Filled 2020-07-07: qty 10

## 2020-07-07 MED ORDER — PRO-STAT SUGAR FREE PO LIQD
30.0000 mL | Freq: Two times a day (BID) | ORAL | Status: DC
  Administered 2020-07-07 – 2020-07-12 (×10): 30 mL via ORAL
  Filled 2020-07-07 (×13): qty 30

## 2020-07-07 MED ORDER — VANCOMYCIN 50 MG/ML ORAL SOLUTION
125.0000 mg | Freq: Four times a day (QID) | ORAL | Status: DC
  Administered 2020-07-07 – 2020-07-09 (×8): 125 mg via ORAL
  Filled 2020-07-07 (×12): qty 2.5

## 2020-07-07 MED ORDER — VITAMIN D3 25 MCG (1000 UNIT) PO TABS
6000.0000 [IU] | ORAL_TABLET | Freq: Every day | ORAL | Status: DC
  Administered 2020-07-07 – 2020-07-13 (×7): 6000 [IU] via ORAL
  Filled 2020-07-07 (×8): qty 6

## 2020-07-07 MED ORDER — POTASSIUM CHLORIDE CRYS ER 10 MEQ PO TBCR
10.0000 meq | EXTENDED_RELEASE_TABLET | Freq: Every day | ORAL | Status: DC
  Administered 2020-07-07 – 2020-07-13 (×7): 10 meq via ORAL
  Filled 2020-07-07 (×13): qty 1

## 2020-07-07 MED ORDER — SODIUM CHLORIDE 0.9 % IV SOLN
2.0000 g | INTRAVENOUS | Status: DC
  Administered 2020-07-07 – 2020-07-08 (×2): 2 g via INTRAVENOUS
  Administered 2020-07-08: 1 g via INTRAVENOUS
  Filled 2020-07-07 (×2): qty 2

## 2020-07-07 MED ORDER — VANCOMYCIN 50 MG/ML ORAL SOLUTION: 125.0000 mg | Freq: Every day | ORAL | Status: DC

## 2020-07-07 NOTE — Evaluation (Signed)
Clinical/Bedside Swallow Evaluation Patient Details  Name: Renee Huang MRN: [id number] Date of Birth: [birth year]  Today's Date: 07/07/2020 Time: SLP Start Time (ACUTE ONLY): 1535 SLP Stop Time (ACUTE ONLY): 1600 SLP Time Calculation (min) (ACUTE ONLY): 25 min  Past Medical History:  Past Medical History:  Diagnosis Date  . Actinic keratosis   . BCC (basal cell carcinoma of skin)   . Closed fracture of second metatarsal bone of right foot   . Closed fracture of third metatarsal bone of right foot with routine healing   . IBS (irritable bowel syndrome)   . Multiple sclerosis ([place])   . Neurogenic bladder   . Rectal polyp    Tubular adenoma  . Recurrent UTI   . SCC (squamous cell carcinoma)    Past Surgical History:  Past Surgical History:  Procedure Laterality Date  . BOTOX INJECTION    . BREAST BIOPSY    . COLONOSCOPY    . SKIN CANCER EXCISION     HPI:  64 y.o. female with history of primary progressive multiple sclerosis presently not on any disease modifying medication was found to be lethargic at the facility and was brought to the ER.  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  Patient had worsening back pain and as per the report was given some hydrocodone following which patient became more lethargic BSE generated d/t coughing with liquids in ED; CXR on 07/07/20 indicated Small left pleural effusion  Assessment / Plan / Recommendation Clinical Impression  Pt seen for skilled BSE with various consistencies administered including: thin via cup, puree and soft solid with slow bolus preparation/transition to pharynx, suspected delay in the initiation of the swallow and delayed cough noted with larger volume of thin liquids during intake.  Belching noted intermittently during PO consumption which may indicate GERD.  She also fatigued quickly during meal with regular consistency and exhibited slow, deliberate mastication throughout intake.   Recommend downgrading pt to D3 (mechanical soft) diet with thin liquids via small sips by cup and no straws.  ST will f/u for diet tolerance while in acute setting.  Thank you for this consult. SLP Visit Diagnosis: Dysphagia, oropharyngeal phase (R13.12)    Aspiration Risk  Mild aspiration risk;Moderate aspiration risk    Diet Recommendation   Dysphagia 3/thin liquids (small amounts only by cup)  Medication Administration: Other (Comment) (whole meds with liquids, small sips)    Other  Recommendations Oral Care Recommendations: Oral care BID   Follow up Recommendations Skilled Nursing facility      Frequency and Duration min 1 x/week  1 week       Prognosis Prognosis for Safe Diet Advancement: Good      Swallow Study   General Date of Onset: 07/07/20 HPI: 64 y.o. female with history of primary progressive multiple sclerosis presently not on any disease modifying medication was found to be lethargic at the facility and was brought to the ER.  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  Patient had worsening back pain and as per the report was given some hydrocodone following which patient became more lethargic Type of Study: Bedside Swallow Evaluation Previous Swallow Assessment: n/a Diet Prior to this Study: Regular;Thin liquids Temperature Spikes Noted: Yes (low grade, 99) Respiratory Status: Room air History of Recent Intubation: No Behavior/Cognition: Alert;Cooperative;Requires cueing Oral Cavity Assessment: Dry Oral Care Completed by SLP: No Oral Cavity - Dentition: Adequate natural dentition Vision: Functional for self-feeding Self-Feeding  Abilities: Total assist Patient Positioning: Upright in bed Baseline Vocal Quality: Low vocal intensity Volitional Cough: Weak Volitional Swallow: Able to elicit    Oral/Motor/Sensory Function Overall Oral Motor/Sensory Function: Generalized oral weakness   Ice Chips Ice chips: Not tested    Thin Liquid Thin Liquid: Impaired Presentation: Cup Oral Phase Impairments: Reduced lingual movement/coordination Oral Phase Functional Implications: Prolonged oral transit Pharyngeal  Phase Impairments: Throat Clearing - Delayed Other Comments:  (with larger volumes)    Nectar Thick Nectar Thick Liquid: Not tested   Honey Thick Honey Thick Liquid: Not tested   Puree Puree: Impaired Presentation: Spoon Oral Phase Impairments: Reduced lingual movement/coordination Oral Phase Functional Implications: Prolonged oral transit Pharyngeal Phase Impairments: Suspected delayed Swallow   Solid     Solid: Impaired Presentation: Spoon Oral Phase Impairments: Reduced lingual movement/coordination Oral Phase Functional Implications: Prolonged oral transit Pharyngeal Phase Impairments: Suspected delayed Swallow      Elvina Sidle, M.S., CCC-SLP 07/07/2020,4:26 PM

## 2020-07-07 NOTE — Progress Notes (Signed)
Pt tx from Ed placed on teley box number 32. Pt placed in the bed, Chg completed, v/s stable. Report given to M[place] RN who will resume care.

## 2020-07-07 NOTE — ED Notes (Addendum)
Hourly rounding performed, fluid offered. Pt. Checked for clean brief and pt. Was dry. Side rails up and call bell within reach. Will continue to monitor.

## 2020-07-07 NOTE — H&P (Signed)
History and Physical    Renee Huang HYI:[id number] DOB: 1956/01/06 DOA: 07/07/2020  PCP: [name], DO  Patient coming from: Skilled nursing facility.  Chief Complaint: Lethargy.  HPI: Renee Huang is a 64 y.o. female with history of primary progressive multiple sclerosis presently not on any disease modifying medication was found to be lethargic at the facility and was brought to the ER.  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  Patient had worsening back pain and as per the report was given some hydrocodone following which patient became more lethargic.  It was told that patient was hypotensive.  ED Course: In the ER patient is mildly lethargic but answering questions appropriately and he is giving good history.  UA is concerning for UTI.  Patient is maintaining his sats.  Patient states she took some hydrocodone after she had some worsening back pain yesterday following which she became more confused.  Patient denies any headache nausea vomiting or abdominal pain.  In the ER patient had one episode of loose stools.  Labs showing worsening anemia usually hemoglobin is around 9 at baseline is around 7 at this time.  Stool did not look melanotic in the ER as per the ER physician.  Patient was started on ceftriaxone for UTI admitted for lethargy/acute encephalopathy could be related to pain medication although could be secondary to UTI.  Covid test was negative.  Review of Systems: As per HPI, rest all negative.   Past Medical History:  Diagnosis Date   Actinic keratosis    BCC (basal cell carcinoma of skin)    Closed fracture of second metatarsal bone of right foot    Closed fracture of third metatarsal bone of right foot with routine healing    IBS (irritable bowel syndrome)    Multiple sclerosis (HCC)    Neurogenic bladder    Rectal polyp    Tubular adenoma   Recurrent UTI    SCC (squamous cell carcinoma)     Past  Surgical History:  Procedure Laterality Date   BOTOX INJECTION     BREAST BIOPSY     COLONOSCOPY     SKIN CANCER EXCISION       reports that she has never smoked. She has never used smokeless tobacco. She reports current alcohol use of about 18.0 standard drinks of alcohol per week. She reports that she does not use drugs.  No Known Allergies  Family History  Problem Relation Age of Onset   Hypertension Mother    Hyperlipidemia Mother    Breast cancer Mother    Hypertension Father    Heart attack Father    Hypertension Brother    Diabetes Brother    Lung cancer Maternal Grandmother    Heart attack Paternal Grandfather     Prior to Admission medications   Medication Sig Start Date End Date Taking? Authorizing Provider  acetaminophen (TYLENOL) 325 MG tablet Take 325 mg by mouth every 4 (four) hours as needed for mild pain, fever or headache.    Yes [provider]  Amino Acids-Protein Hydrolys (FEEDING SUPPLEMENT, PRO-STAT SUGAR FREE 64,) LIQD Take 30 mLs by mouth in the morning and at bedtime.   Yes [provider]  ampicillin-sulbactam (UNASYN) IVPB Inject 3 g into the vein every 6 (six) hours. Every 6 hours for 4 weeks. STARTED 07/05/2020   Yes [provider]  Ascorbic Acid (VITAMIN C PO) Take 500 mg by mouth 2 (two) times daily.  Yes [provider]  baclofen (LIORESAL) 10 MG tablet Take 30 mg by mouth at bedtime. Take 3 tablets to = 30 mg   Yes [provider]  baclofen (LIORESAL) 20 MG tablet Take 20 mg by mouth 3 (three) times daily. 01/24/20  Yes [provider]  cetirizine (ZYRTEC) 5 MG tablet Take 1 tablet (5 mg total) by mouth at bedtime. 03/23/20  Yes Reed, Tiffany L, DO  Cholecalciferol 25 MCG (1000 UT) tablet Take 1,000 Units by mouth daily. Take along with a 5000 unit tablet to = 6000 units   Yes [provider]  dicyclomine (BENTYL) 20 MG tablet Take 20 mg by mouth in the morning, at noon, in  the evening, and at bedtime.    Yes [provider]  FLUoxetine (PROZAC) 40 MG capsule Take 1 capsule (40 mg total) by mouth daily. 03/23/20  Yes Reed, Tiffany L, DO  HYDROcodone-acetaminophen (NORCO/VICODIN) 5-325 MG tablet Take 1 tablet by mouth every 4 (four) hours. While awake 07/03/20  Yes Reed, Tiffany L, DO  hydrocortisone cream 1 % Apply 1 application topically every 12 (twelve) hours as needed for itching.    Yes [provider]  Lactobacillus Rhamnosus, GG, (CULTURELLE) CAPS Take by mouth. 10B Cell Capsule   Yes [provider]  LORazepam (ATIVAN) 0.5 MG tablet Take 0.5 mg by mouth daily as needed (Panic attack).   Yes [provider]  Menthol, Topical Analgesic, (BIOFREEZE EX) Apply 5 % topically in the morning, at noon, in the evening, and at bedtime. Apply to Left Shoulder.   Yes [provider]  mirtazapine (REMERON) 7.5 MG tablet Take 7.5 mg by mouth at bedtime. 07/03/20  Yes [provider]  NON FORMULARY Magic Cup with L/D meals d/t weight loss. 07/02/20  Yes [provider]  Nutritional Supplements (ENSURE CLEAR PO) Take by mouth in the morning and at bedtime. D/t weight loss and wound healing 07/02/20  Yes [provider]  Nutritional Supplements (JUVEN PO) Take 1 each by mouth in the morning and at bedtime.   Yes [provider]  ondansetron (ZOFRAN) 4 MG tablet Take 4 mg by mouth in the morning, at noon, and at bedtime. Nausea and Vomiting 07/06/20  Yes [provider]  potassium chloride (KLOR-CON) 10 MEQ tablet Take 10 mEq by mouth daily.  02/08/20  Yes [provider]  pravastatin (PRAVACHOL) 40 MG tablet Take 40 mg by mouth daily.   Yes [provider]  tamsulosin (FLOMAX) 0.4 MG CAPS capsule Take 0.4 mg by mouth daily.   Yes [provider]  vitamin B-12 (CYANOCOBALAMIN) 1000 MCG tablet Take 1,000 mcg by mouth daily.   Yes [provider]  Vitamin D,  Cholecalciferol, 25 MCG (1000 UT) TABS Take 5,000 Units by mouth daily. Along with a 1000 unit to = 6000 units 01/23/20  Yes [provider]  tiZANidine (ZANAFLEX) 2 MG tablet Take 4 mg by mouth at bedtime.  06/28/20   [provider]    Physical Exam: Constitutional: Moderately built and nourished. Vitals:   07/07/20 0300 07/07/20 0315 07/07/20 0330 07/07/20 0345  BP: 105/69 (!) 115/56 114/64 (!) 111/48  Pulse: 65 64 70 76  Resp: 18 16 16 13   Temp:      TempSrc:      SpO2: 100% 100% 100% 100%  Weight:      Height:       Eyes: Anicteric no pallor. ENMT: No discharge from the ears eyes nose  or mouth. Neck: No neck rigidity no mass felt. Respiratory: No rhonchi or crepitations. Cardiovascular: S1-S2 heard. Abdomen: Soft nontender bowel sounds present. Musculoskeletal: No edema. Skin: Chronic skin changes. Neurologic: Alert awake oriented to name place and person.  Weak on all extremities. Psychiatric: Appears normal.  Normal affect.   Labs on Admission: I have personally reviewed following labs and imaging studies  CBC: Recent Labs  Lab 07/02/20 0000 07/04/20 0000 07/07/20 0102  WBC 18.1 17.6 14.6*  NEUTROABS  --   --  11.6*  HGB 10.1* 9.5* 7.8*  HCT 33* 29* 26.4*  MCV  --   --  85.7  PLT 551* 555* 793*   Basic Metabolic Panel: Recent Labs  Lab 07/02/20 0000 07/07/20 0102  NA 137 139  K 5.1 4.0  CL 104 105  CO2 23* 26  GLUCOSE  --  88  BUN  --  18  CREATININE 0.6 0.62  CALCIUM 8.9 7.8*   GFR: Estimated Creatinine Clearance: 63.9 mL/min (by C-G formula based on SCr of 0.62 mg/dL). Liver Function Tests: Recent Labs  Lab 07/07/20 0102  AST 10*  ALT 7  ALKPHOS 75  BILITOT 0.5  PROT 4.5*  ALBUMIN 1.6*   No results for input(s): LIPASE, AMYLASE in the last 168 hours. No results for input(s): AMMONIA in the last 168 hours. Coagulation Profile: Recent Labs  Lab 07/07/20 0102  INR 1.4*   Cardiac Enzymes: No results for input(s):  CKTOTAL, CKMB, CKMBINDEX, TROPONINI in the last 168 hours. BNP (last 3 results) No results for input(s): PROBNP in the last 8760 hours. HbA1C: No results for input(s): HGBA1C in the last 72 hours. CBG: No results for input(s): GLUCAP in the last 168 hours. Lipid Profile: No results for input(s): CHOL, HDL, LDLCALC, TRIG, CHOLHDL, LDLDIRECT in the last 72 hours. Thyroid Function Tests: No results for input(s): TSH, T4TOTAL, FREET4, T3FREE, THYROIDAB in the last 72 hours. Anemia Panel: No results for input(s): VITAMINB12, FOLATE, FERRITIN, TIBC, IRON, RETICCTPCT in the last 72 hours. Urine analysis:    Component Value Date/Time   COLORURINE YELLOW 07/07/2020 0102   APPEARANCEUR CLOUDY (A) 07/07/2020 0102   LABSPEC 1.016 07/07/2020 0102   PHURINE 6.0 07/07/2020 0102   GLUCOSEU NEGATIVE 07/07/2020 0102   HGBUR NEGATIVE 07/07/2020 0102   BILIRUBINUR NEGATIVE 07/07/2020 0102   KETONESUR NEGATIVE 07/07/2020 0102   PROTEINUR 100 (A) 07/07/2020 0102   NITRITE NEGATIVE 07/07/2020 0102   LEUKOCYTESUR LARGE (A) 07/07/2020 0102   Sepsis Labs: @LABRCNTIP (procalcitonin:4,lacticidven:4) )No results found for this or any previous visit (from the past 240 hour(s)).   Radiological Exams on Admission: DG Chest Port 1 View  Result Date: 07/07/2020 CLINICAL DATA:  Possible overdose. EXAM: PORTABLE CHEST 1 VIEW COMPARISON:  January 19, 2020 FINDINGS: A right-sided PICC line is seen with its distal tip noted at the junction of the superior vena cava and right atrium. There is no evidence of acute infiltrate. A small left pleural effusion is seen. No pneumothorax is identified. The heart size and mediastinal contours are within normal limits. The visualized skeletal structures are unremarkable. IMPRESSION: 1. Right-sided PICC line positioning, as described above. 2. Small left pleural effusion. Electronically Signed   By: Virgina Norfolk M.D.   On: 07/07/2020 02:40    EKG: Independently reviewed.  Normal  sinus rhythm.  Assessment/Plan Principal Problem:   Acute encephalopathy Active Problems:   Hypertension   Multiple sclerosis, primary progressive (HCC)   Recurrent colitis due to Clostridium difficile   Anemia  Acute lower UTI    1. Acute encephalopathy/lethargy could be due to the pain medication which we will hold off for now.  Patient also has possible UTI which could be contributing.  Follow urine cultures presently on ceftriaxone.  In addition will check ABG ammonia levels and urine drug screen. 2. Worsening anemia with chronic anemia.  We will check anemia panel.  Follow CBC. 3. History of hyperlipidemia on statins. 4. Primary progressive multiple sclerosis disease modifying medication.  On baclofen and tizanidine. 5. Recurrent C. difficile colitis.  On p.o. vancomycin.  Patient had another loose stools in the ER.  Vancomycin dosed per pharmacy.  Enteric precautions.  Note that patient is on baclofen and tizanidine and mirtazapine.  If there is no improvement in patient's alertness in the morning or if ABG shows any capillary retention may have to hold all these medications.  At the time of my exam patient was becoming more alert.   DVT prophylaxis: Lovenox. Code Status: Full code. Family Communication: Discussed with patient. Disposition Plan: Back to facility when stable. Consults called: None. Admission status: Observation.   Rise Patience MD [facility] Pager [phone].  If 7PM-7AM, please contact night-coverage [email or web address] Password [facility]  07/07/2020, 5:40 AM

## 2020-07-07 NOTE — Progress Notes (Signed)
PROGRESS NOTE    Renee Huang  FYB:[id number] DOB: 02/26/1956 DOA: 07/07/2020 PCP: [name], DO   Chief Complaint  Patient presents with  . Altered Mental Status   Brief Narrative:  Renee Huang is Renee Huang 64 y.o. female with history of primary progressive multiple sclerosis presently not on any disease modifying medication was found to be lethargic at the facility and was brought to the ER.  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  Patient had worsening back pain and as per the report was given some hydrocodone following which patient became more lethargic.  It was told that patient was hypotensive.  ED Course: In the ER patient is mildly lethargic but answering questions appropriately and he is giving good history.  UA is concerning for UTI.  Patient is maintaining his sats.  Patient states she took some hydrocodone after she had some worsening back pain yesterday following which she became more confused.  Patient denies any headache nausea vomiting or abdominal pain.  In the ER patient had one episode of loose stools.  Labs showing worsening anemia usually hemoglobin is around 9 at baseline is around 7 at this time.  Stool did not look melanotic in the ER as per the ER physician.  Patient was started on ceftriaxone for UTI admitted for lethargy/acute encephalopathy could be related to pain medication although could be secondary to UTI.  Covid test was negative.  Assessment & Plan:   Principal Problem:   Acute encephalopathy Active Problems:   Hypertension   Multiple sclerosis, primary progressive ([place])   Recurrent colitis due to Clostridium difficile   Anemia   Acute lower UTI  1. Acute metabolic encephalopathy 1. Sounds like temporally related to pain medication 2. Infection could be contributing - UA concerning for UTI also has stage IV decubitus ulcer with likely osteo 3. Follow cultures 4. Hold norco and tizanidine.  Will resume  baclofen, caution.   5. Negative ammonia, normal B12, follow TSH.  Utox positive for opiates. 6. Seems to be improving  2. Concern for Osteomyelitis  Stage IV Decubitus Ulcer  1. Stage IV decub, foul smelling -> plan was for bone marrow bx as outpatient 2. Will treat with vanc/ceftriaxone/flagyl  3. Please c/s ID and surgery in AM  3. Possible UTI: follow urine cx, on abx as noted above  4. Normocytic Anemia  Folate Deficiency  Iron Def  Anemia of Chronic Disease: 1. Folate and iron supplementation  5. HLD: continue statin  6. Primary progressive multiple sclerosis disease modifying medication.  On baclofen and tizanidine.  7. Recurrent C. difficile colitis.  On p.o. vancomycin.  Patient had another loose stools in the ER.  Vancomycin dosed per pharmacy.  Enteric precautions. 1. Her course of oral vanc will need to be extended while she's on antibiotics  DVT prophylaxis: lovenox Code Status: full  Family Communication: unable to reach  Disposition:   Status is: Observation  The patient will require care spanning > 2 midnights and should be moved to inpatient because: Inpatient level of care appropriate due to severity of illness  Dispo: The patient is from: SNF              Anticipated d/c is to: SNF              Anticipated d/c date is: > 3 days              Patient currently is not medically stable to d/c.  Consultants:   none  Procedures:   none  Antimicrobials:  Anti-infectives (From admission, onward)   Start     Dose/Rate Route Frequency Ordered Stop   08/12/20 1000  vancomycin (VANCOCIN) 50 mg/mL oral solution 125 mg       "Followed by" Linked Group Details   125 mg Oral Every 3 DAYS 07/07/20 0557 08/21/20 0959   08/04/20 1000  vancomycin (VANCOCIN) 50 mg/mL oral solution 125 mg       "Followed by" Linked Group Details   125 mg Oral Every other day 07/07/20 0557 08/12/20 0959   07/28/20 1000  vancomycin (VANCOCIN) 50 mg/mL oral solution 125 mg         "Followed by" Linked Group Details   125 mg Oral Daily 07/07/20 0557 08/04/20 0959   07/21/20 1000  vancomycin (VANCOCIN) 50 mg/mL oral solution 125 mg       "Followed by" Linked Group Details   125 mg Oral 2 times daily 07/07/20 0557 07/28/20 0959   07/08/20 0600  cefTRIAXone (ROCEPHIN) 1 g in sodium chloride 0.9 % 100 mL IVPB        1 g 200 mL/hr over 30 Minutes Intravenous Every 24 hours 07/07/20 0540     07/07/20 2200  metroNIDAZOLE (FLAGYL) tablet 500 mg        500 mg Oral Every 8 hours 07/07/20 1932     07/07/20 1000  vancomycin (VANCOCIN) 50 mg/mL oral solution 125 mg       "Followed by" Linked Group Details   125 mg Oral 4 times daily 07/07/20 0557 07/21/20 0959   07/07/20 0245  cefTRIAXone (ROCEPHIN) 1 g in sodium chloride 0.9 % 100 mL IVPB        1 g 200 mL/hr over 30 Minutes Intravenous  Once 07/07/20 0237 07/07/20 0635      Subjective: Renee Huang&Ox3  No new complaints, says she feels like she was improving, feels like sx started after norco  Objective: Vitals:   07/07/20 0600 07/07/20 0706 07/07/20 1037 07/07/20 1437  BP: (!) 129/54 (!) 120/41 (!) 128/49 139/83  Pulse: 100 92 89 84  Resp: 13 20 14 15   Temp:  99.5 F (37.5 C) 99.3 F (37.4 C) 98.2 F (36.8 C)  TempSrc:  Axillary Oral Oral  SpO2: 100% 100% 98% 97%  Weight:      Height:        Intake/Output Summary (Last 24 hours) at 07/07/2020 1923 Last data filed at 07/07/2020 1500 Gross per 24 hour  Intake 60 ml  Output 400 ml  Net -340 ml   Filed Weights   07/07/20 0112  Weight: 61.2 kg    Examination:  General exam: Appears calm and comfortable  Respiratory system: Clear to auscultation. Respiratory effort normal. Cardiovascular system: S1 & S2 heard, RRR. No JVD, murmurs, rubs, gallops or clicks. No pedal edema. Gastrointestinal system: Abdomen is nondistended, soft and nontender. Central nervous system: Alert and oriented. Symmetric upper extremity weakness < LE wekaness. Extremities: no LEE Skin:  No rashes, lesions or ulcers Psychiatry: Judgement and insight appear normal. Mood & affect appropriate.     Data Reviewed: I have personally reviewed following labs and imaging studies  CBC: Recent Labs  Lab 07/02/20 0000 07/04/20 0000 07/07/20 0102 07/07/20 0605  WBC 18.1 17.6 14.6* 18.1*  NEUTROABS  --   --  11.6* 14.3*  HGB 10.1* 9.5* 7.8* 8.9*  HCT 33* 29* 26.4* 29.5*  MCV  --   --  85.7 85.3  PLT 551* 555* 465* 630*    Basic Metabolic Panel: Recent Labs  Lab 07/02/20 0000 07/07/20 0102 07/07/20 0605  NA 137 139 141  K 5.1 4.0 3.6  CL 104 105 105  CO2 23* 26 26  GLUCOSE  --  88 73  BUN  --  18 15  CREATININE 0.6 0.62 0.58  CALCIUM 8.9 7.8* 8.1*    GFR: Estimated Creatinine Clearance: 63.9 mL/min (by C-G formula based on SCr of 0.58 mg/dL).  Liver Function Tests: Recent Labs  Lab 07/07/20 0102 07/07/20 0605  AST 10* 13*  ALT 7 7  ALKPHOS 75 84  BILITOT 0.5 0.5  PROT 4.5* 5.1*  ALBUMIN 1.6* 1.8*    CBG: No results for input(s): GLUCAP in the last 168 hours.   Recent Results (from the past 240 hour(s))  Blood culture (routine single)     Status: None (Preliminary result)   Collection Time: 07/07/20  1:02 AM   Specimen: BLOOD  Result Value Ref Range Status   Specimen Description   Final    BLOOD RIGHT WRIST Performed at [facility] [address] [place], [place] [address]    Special Requests   Final    BOTTLES DRAWN AEROBIC AND ANAEROBIC Blood Culture adequate volume Performed at [facility] [address] [place], [place] [address]    Culture   Final    NO GROWTH < 12 HOURS Performed at [facility] [address], [place],  [address]    Report Status PENDING  Incomplete  Resp Panel by RT-PCR (Flu Rocket Gunderson&B, Covid) Nasopharyngeal Swab     Status: None   Collection Time: 07/07/20  5:17 AM   Specimen: Nasopharyngeal Swab; Nasopharyngeal(NP) swabs in vial transport medium  Result Value  Ref Range Status   SARS Coronavirus 2 by RT PCR NEGATIVE NEGATIVE Final    Comment: (NOTE) SARS-CoV-2 target nucleic acids are NOT DETECTED.  The SARS-CoV-2 RNA is generally detectable in upper respiratory specimens during the acute phase of infection. The lowest concentration of SARS-CoV-2 viral copies this assay can detect is 138 copies/mL. Renee Huang negative result does not preclude SARS-Cov-2 infection and should not be used as the sole basis for treatment or other patient management decisions. Renee Huang negative result may occur with  improper specimen collection/handling, submission of specimen other than nasopharyngeal swab, presence of viral mutation(s) within the areas targeted by this assay, and inadequate number of viral copies(<138 copies/mL). Renee Huang negative result must be combined with clinical observations, patient history, and epidemiological information. The expected result is Negative.  Fact Sheet for Patients:  EntrepreneurPulse.com.au  Fact Sheet for Healthcare Providers:  IncredibleEmployment.be  This test is no t yet approved or cleared by the [place] FDA and  has been authorized for detection and/or diagnosis of SARS-CoV-2 by FDA under an Emergency Use Authorization (EUA). This EUA will remain  in effect (meaning this test can be used) for the duration of the COVID-19 declaration under Section 564(b)(1) of the Act, 21 U.S.C.section 360bbb-3(b)(1), unless the authorization is terminated  or revoked sooner.       Influenza Wisdom Seybold by PCR NEGATIVE NEGATIVE Final   Influenza B by PCR NEGATIVE NEGATIVE Final    Comment: (NOTE) The Xpert Xpress SARS-CoV-2/FLU/RSV plus assay is intended as an aid in the diagnosis of influenza from Nasopharyngeal swab specimens and should not be used as Renee Huang sole basis for treatment. Nasal washings and aspirates are unacceptable for Xpert Xpress SARS-CoV-2/FLU/RSV testing.  Fact  Sheet for  Patients: EntrepreneurPulse.com.au  Fact Sheet for Healthcare Providers: IncredibleEmployment.be  This test is not yet approved or cleared by the [place] FDA and has been authorized for detection and/or diagnosis of SARS-CoV-2 by FDA under an Emergency Use Authorization (EUA). This EUA will remain in effect (meaning this test can be used) for the duration of the COVID-19 declaration under Section 564(b)(1) of the Act, 21 U.S.C. section 360bbb-3(b)(1), unless the authorization is terminated or revoked.  Performed at [facility], [place] [address], [place], [place] [address]          Radiology Studies: [facility] Chest Port 1 View  Result Date: 07/07/2020 CLINICAL DATA:  Possible overdose. EXAM: PORTABLE CHEST 1 VIEW COMPARISON:  January 19, 2020 FINDINGS: Renee Huang right-sided PICC line is seen with its distal tip noted at the junction of the superior vena cava and right atrium. There is no evidence of acute infiltrate. Renee Huang small left pleural effusion is seen. No pneumothorax is identified. The heart size and mediastinal contours are within normal limits. The visualized skeletal structures are unremarkable. IMPRESSION: 1. Right-sided PICC line positioning, as described above. 2. Small left pleural effusion. Electronically Signed   By: Virgina Norfolk M.D.   On: 07/07/2020 02:40        Scheduled Meds: . vitamin C  500 mg Oral BID  . baclofen  20 mg Oral TID with meals  . baclofen  30 mg Oral QHS  . Chlorhexidine Gluconate Cloth  6 each Topical Daily  . cholecalciferol  6,000 Units Oral Daily  . dicyclomine  20 mg Oral TID AC  . enoxaparin (LOVENOX) injection  40 mg Subcutaneous Q24H  . feeding supplement (PRO-STAT SUGAR FREE 64)  30 mL Oral BID  . [START ON 07/08/2020] ferrous sulfate  325 mg Oral Q breakfast  . folic acid  1 mg Oral Daily  . loratadine  10 mg Oral Daily  . mirtazapine  7.5 mg Oral QHS  . potassium chloride  10 mEq  Oral Daily  . pravastatin  40 mg Oral Daily  . tamsulosin  0.4 mg Oral Daily  . tiZANidine  4 mg Oral QHS  . vancomycin  125 mg Oral QID   Followed by  . [START ON 07/21/2020] vancomycin  125 mg Oral BID   Followed by  . [START ON 07/28/2020] vancomycin  125 mg Oral Daily   Followed by  . [START ON 08/04/2020] vancomycin  125 mg Oral QODAY   Followed by  . [START ON 08/12/2020] vancomycin  125 mg Oral Q3 days  . vitamin B-12  1,000 mcg Oral Daily   Continuous Infusions: . [START ON 07/08/2020] cefTRIAXone (ROCEPHIN)  IV    . lactated ringers 125 mL/hr at 07/07/20 1844     LOS: 0 days    Time spent: over 30 min    Fayrene Helper, MD [facility]   To contact the attending provider between 7A-7P or the covering provider during after hours 7P-7A, please log into the web site [email or web address] and access using universal [place] password for that web site. If you do not have the password, please call the hospital operator.  07/07/2020, 7:23 PM

## 2020-07-07 NOTE — ED Triage Notes (Signed)
Pt from [facility]. New medication of hydrocodone. Staff unable to give dosing and reports possible overdose. Pt currently being tx for UTI and cdiff. Staff unable to provide additional information. Initial BP 60/40, then 80/palp after 300 cc NS. 22G RFA.

## 2020-07-07 NOTE — Progress Notes (Addendum)
Pharmacy Antibiotic Note  Renee Huang is a 64 y.o. female with admitted on 07/07/2020 with concern for osteomyelitis and UTI. Recently completed a lengthy course of PO vanc for recurrent Cdiff (Jun >> Oct 2021), and appears patient started on Unasyn 11/18 at facility to continue for 4 wks. Pharmacy has been consulted for vancomycin dosing for osteo; MD adding Rocephin and PO Flagyl, and will add PO vanc while on abx.  Plan:  Vancomycin 1250 mg IV q24 hr  Measure vancomycin levels at steady state as indicated  SCr q48 while on vanc  Rocephin, Flagyl, PO vanc per MD; will increase Rocephin to 2g - although intended for UTI, since this is providing osteo coverage as well, will increase to osteo dosing   Height: 5\' 5"  (165.1 cm) Weight: 61.2 kg (134 lb 14.7 oz) IBW/kg (Calculated) : 57  Temp (24hrs), Avg:98.6 F (37 C), Min:97.8 F (36.6 C), Max:99.5 F (37.5 C)  Recent Labs  Lab 07/02/20 0000 07/04/20 0000 07/07/20 0102 07/07/20 0605  WBC 18.1 17.6 14.6* 18.1*  CREATININE 0.6  --  0.62 0.58  LATICACIDVEN  --   --  1.3  --     Estimated Creatinine Clearance: 63.9 mL/min (by C-G formula based on SCr of 0.58 mg/dL).    No Known Allergies  Antimicrobials this admission: 11/18 Unasyn >> 11/20 (as outpatient) 11/20 vanc >>  11/20 CTX/Flagyl >>  11/20 PO vanc >>  Dose adjustments this admission: n/a  Microbiology results: 11/20 BCx (x1): sent 11/20 UCx: sent    Thank you for allowing pharmacy to be a part of this patient's care.  Raphaella Larkin A 07/07/2020 7:58 PM

## 2020-07-07 NOTE — Progress Notes (Signed)
Page NP Kennon Holter and made her aware that the pt is in a yellow mews, also informed Duffy Rhody RN who is in charge, placed the pt on the yellow mews protocol. She is alert and arousalable. I will continue to monitor.

## 2020-07-07 NOTE — ED Provider Notes (Signed)
[facility] DEPT Provider Note: Renee Spurling, MD, FACEP  CSN: [id number] MRN: [id number] ARRIVAL: 07/07/20 at [place]: WA09/[id number]   CHIEF COMPLAINT  Altered Mental Status  Level 5 caveat: Altered mental status HISTORY OF PRESENT ILLNESS  07/07/20 1:09 AM Renee Huang is a 64 y.o. female with multiple sclerosis who was sent from her nursing home for altered mental status.  EMS noted her to be hypotensive and started an IV fluid bolus with some improvement.  Her blood pressure on arrival is 80 systolic.  She was able to give her name and state that she was on an ambulance.  She was able to say she is not in any pain.  A full history cannot be obtained from her.  She has not had a fever so far as is known.   Past Medical History:  Diagnosis Date  . Actinic keratosis   . BCC (basal cell carcinoma of skin)   . Closed fracture of second metatarsal bone of right foot   . Closed fracture of third metatarsal bone of right foot with routine healing   . IBS (irritable bowel syndrome)   . Multiple sclerosis ([place])   . Neurogenic bladder   . Rectal polyp    Tubular adenoma  . Recurrent UTI   . SCC (squamous cell carcinoma)     Past Surgical History:  Procedure Laterality Date  . BOTOX INJECTION    . BREAST BIOPSY    . COLONOSCOPY    . SKIN CANCER EXCISION      Family History  Problem Relation Age of Onset  . Hypertension Mother   . Hyperlipidemia Mother   . Breast cancer Mother   . Hypertension Father   . Heart attack Father   . Hypertension Brother   . Diabetes Brother   . Lung cancer Maternal Grandmother   . Heart attack Paternal Grandfather     Social History   Tobacco Use  . Smoking status: Never Smoker  . Smokeless tobacco: Never Used  Vaping Use  . Vaping Use: Never used  Substance Use Topics  . Alcohol use: Yes    Alcohol/week: 18.0 standard drinks    Types: 2 Glasses of wine, 16 Standard drinks or equivalent per week  . Drug use: Never    Prior to  Admission medications   Medication Sig Start Date End Date Taking? Authorizing Provider  acetaminophen (TYLENOL) 325 MG tablet Take 325 mg by mouth every 4 (four) hours as needed for mild pain, fever or headache.    Yes [provider]  Amino Acids-Protein Hydrolys (FEEDING SUPPLEMENT, PRO-STAT SUGAR FREE 64,) LIQD Take 30 mLs by mouth in the morning and at bedtime.   Yes [provider]  ampicillin-sulbactam (UNASYN) IVPB Inject 3 g into the vein every 6 (six) hours. Every 6 hours for 4 weeks. STARTED 07/05/2020   Yes [provider]  Ascorbic Acid (VITAMIN C PO) Take 500 mg by mouth 2 (two) times daily.   Yes [provider]  baclofen (LIORESAL) 10 MG tablet Take 30 mg by mouth at bedtime. Take 3 tablets to = 30 mg   Yes [provider]  baclofen (LIORESAL) 20 MG tablet Take 20 mg by mouth 3 (three) times daily. 01/24/20  Yes [provider]  cetirizine (ZYRTEC) 5 MG tablet Take 1 tablet (5 mg total) by mouth at bedtime. 03/23/20  Yes Reed, Tiffany L, DO  Cholecalciferol 25 MCG (1000 UT) tablet Take 1,000 Units by mouth daily. Take along  with a 5000 unit tablet to = 6000 units   Yes [provider]  dicyclomine (BENTYL) 20 MG tablet Take 20 mg by mouth in the morning, at noon, in the evening, and at bedtime.    Yes [provider]  FLUoxetine (PROZAC) 40 MG capsule Take 1 capsule (40 mg total) by mouth daily. 03/23/20  Yes Reed, Tiffany L, DO  HYDROcodone-acetaminophen (NORCO/VICODIN) 5-325 MG tablet Take 1 tablet by mouth every 4 (four) hours. While awake 07/03/20  Yes Reed, Tiffany L, DO  hydrocortisone cream 1 % Apply 1 application topically every 12 (twelve) hours as needed for itching.    Yes [provider]  Lactobacillus Rhamnosus, GG, (CULTURELLE) CAPS Take by mouth. 10B Cell Capsule   Yes [provider]  LORazepam (ATIVAN) 0.5 MG tablet Take 0.5 mg by mouth daily as needed (Panic attack).   Yes [provider]  Menthol, Topical Analgesic, (BIOFREEZE EX) Apply 5 % topically in the morning, at noon, in the evening, and at bedtime. Apply to Left Shoulder.   Yes [provider]  mirtazapine (REMERON) 7.5 MG tablet Take 7.5 mg by mouth at bedtime. 07/03/20  Yes [provider]  NON FORMULARY Magic Cup with L/D meals d/t weight loss. 07/02/20  Yes [provider]  Nutritional Supplements (ENSURE CLEAR PO) Take by mouth in the morning and at bedtime. D/t weight loss and wound healing 07/02/20  Yes [provider]  Nutritional Supplements (JUVEN PO) Take 1 each by mouth in the morning and at bedtime.   Yes [provider]  ondansetron (ZOFRAN) 4 MG tablet Take 4 mg by mouth in the morning, at noon, and at bedtime. Nausea and Vomiting 07/06/20  Yes [provider]  potassium chloride (KLOR-CON) 10 MEQ tablet Take 10 mEq by mouth daily.  02/08/20  Yes [provider]  pravastatin (PRAVACHOL) 40 MG tablet Take 40 mg by mouth daily.   Yes [provider]  tamsulosin (FLOMAX) 0.4 MG CAPS capsule Take 0.4 mg by mouth daily.   Yes [provider]  vitamin B-12 (CYANOCOBALAMIN) 1000 MCG tablet Take 1,000 mcg by mouth daily.   Yes [provider]  Vitamin D, Cholecalciferol, 25 MCG (1000 UT) TABS Take 5,000 Units by mouth daily. Along with a 1000 unit to = 6000 units 01/23/20  Yes [provider]  tiZANidine (ZANAFLEX) 2 MG tablet Take 4 mg by mouth at bedtime.  06/28/20   [provider]    Allergies Patient has no known allergies.   REVIEW OF SYSTEMS  Cannot assess.  Altered mental status.   PHYSICAL EXAMINATION  Initial Vital Signs Blood pressure (!) 80/49, pulse 60, resp. rate 12, height 5\' 5"  (1.651 m), weight 61.2 kg, SpO2 99 %.  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 Skin: Warm and dry; stage IV sacral decubitus ulcer:    Psychiatric: Flat affect   RESULTS  Summary of this visit's results, reviewed and interpreted by myself:   EKG Interpretation  Date/Time:    Ventricular Rate:    PR Interval:    QRS Duration:  QT Interval:    QTC Calculation:   R Axis:     Text Interpretation:        Laboratory Studies: Results for orders placed or performed during the hospital encounter of 07/07/20 (from the past 24 hour(s))  Lactic acid, plasma     Status: None   Collection Time: 07/07/20  1:02 AM  Result Value Ref Range   Lactic Acid, Venous 1.3 0.5 - 1.9 mmol/L  Comprehensive metabolic panel     Status: Abnormal   Collection Time: 07/07/20  1:02 AM  Result Value Ref Range   Sodium 139 135 - 145 mmol/L   Potassium 4.0 3.5 - 5.1 mmol/L   Chloride 105 98 - 111 mmol/L   CO2 26 22 - 32 mmol/L   Glucose, Bld 88 70 - 99 mg/dL   BUN 18 8 - 23 mg/dL   Creatinine, Ser 0.62 0.44 - 1.00 mg/dL   Calcium 7.8 (L) 8.9 - 10.3 mg/dL   Total Protein 4.5 (L) 6.5 - 8.1 g/dL   Albumin 1.6 (L) 3.5 - 5.0 g/dL   AST 10 (L) 15 - 41 U/L   ALT 7 0 - 44 U/L   Alkaline Phosphatase 75 38 - 126 U/L   Total Bilirubin 0.5 0.3 - 1.2 mg/dL   GFR, Estimated >60 >60 mL/min   Anion gap 8 5 - 15  CBC WITH DIFFERENTIAL     Status: Abnormal   Collection Time: 07/07/20  1:02 AM  Result Value Ref Range   WBC 14.6 (H) 4.0 - 10.5 K/uL   RBC 3.08 (L) 3.87 - 5.11 MIL/uL   Hemoglobin 7.8 (L) 12.0 - 15.0 g/dL   HCT 26.4 (L) 36 - 46 %   MCV 85.7 80.0 - 100.0 fL   MCH 25.3  (L) 26.0 - 34.0 pg   MCHC 29.5 (L) 30.0 - 36.0 g/dL   RDW 18.0 (H) 11.5 - 15.5 %   Platelets 465 (H) 150 - 400 K/uL   nRBC 0.0 0.0 - 0.2 %   Neutrophils Relative % 79 %   Neutro Abs 11.6 (H) 1.7 - 7.7 K/uL   Lymphocytes Relative 12 %   Lymphs Abs 1.7 0.7 - 4.0 K/uL   Monocytes Relative 6 %   Monocytes Absolute 0.9 0.1 - 1.0 K/uL   Eosinophils Relative 2 %   Eosinophils Absolute 0.3 0.0 - 0.5 K/uL   Basophils Relative 0 %   Basophils Absolute 0.1 0.0 - 0.1 K/uL   Immature Granulocytes 1 %   Abs Immature Granulocytes 0.09 (H) 0.00 - 0.07 K/uL  Protime-INR     Status: Abnormal   Collection Time: 07/07/20  1:02 AM  Result Value Ref Range   Prothrombin Time 16.6 (H) 11.4 - 15.2 seconds   INR 1.4 (H) 0.8 - 1.2  APTT     Status: None   Collection Time: 07/07/20  1:02 AM  Result Value Ref Range   aPTT 35 24 - 36 seconds  Urinalysis, Routine w reflex microscopic Urine, Catheterized     Status: Abnormal   Collection Time: 07/07/20  1:02 AM  Result Value Ref Range   Color, Urine YELLOW YELLOW   APPearance CLOUDY (A) CLEAR   Specific Gravity, Urine 1.016 1.005 - 1.030   pH 6.0 5.0 - 8.0   Glucose, UA NEGATIVE NEGATIVE mg/dL   Hgb urine dipstick NEGATIVE NEGATIVE   Bilirubin Urine NEGATIVE NEGATIVE   Ketones, ur NEGATIVE NEGATIVE mg/dL   Protein, ur 100 (A) NEGATIVE mg/dL  Nitrite NEGATIVE NEGATIVE   Leukocytes,Ua LARGE (A) NEGATIVE   RBC / HPF 0-5 0 - 5 RBC/hpf   WBC, UA >50 (H) 0 - 5 WBC/hpf   Bacteria, UA FEW (A) NONE SEEN   WBC Clumps PRESENT    Imaging Studies: No results found.  ED COURSE and MDM  Nursing notes, initial and subsequent vitals signs, including pulse oximetry, reviewed and interpreted by myself.  Vitals:   07/07/20 0124 07/07/20 0130 07/07/20 0145 07/07/20 0200  BP:  (!) 95/50 (!) 102/50 (!) 107/50  Pulse:  67 71 67  Resp:  18 19 16   Temp: 97.8 F (36.6 C)     TempSrc: Rectal     SpO2:  100% 100% 99%  Weight:      Height:       Medications   lactated ringers infusion ( Intravenous New Bag/Given 07/07/20 0118)  cefTRIAXone (ROCEPHIN) 1 g in sodium chloride 0.9 % 100 mL IVPB (has no administration in time range)  lactated ringers bolus 1,000 mL (1,000 mLs Intravenous New Bag/Given 07/07/20 0113)   1:12 AM Fluid bolus LR 1 L initiated for continued hypotension.  Septic work-up initiated. Decubitus ulcer repacked and dressed.  2:37 AM Urine looked grossly cloudy on observation and urinalysis consistent with urinary tract infection.  We will start Rocephin.  Blood pressure has improved with IV fluids.  6:10 AM Dr. Hal Hope to admit to hospitalist service.   PROCEDURES  Procedures   ED DIAGNOSES     ICD-10-CM   1. Lower urinary tract infectious disease  N39.0   2. Normocytic anemia  D64.9   3. Delirium  R41.0   4. Sacral decubitus ulcer, stage IV ([place])  L89.154        Shanon Rosser, MD 07/07/20 [phone]

## 2020-07-08 ENCOUNTER — Inpatient Hospital Stay (HOSPITAL_COMMUNITY): Payer: Medicare Other

## 2020-07-08 ENCOUNTER — Other Ambulatory Visit: Payer: Self-pay

## 2020-07-08 DIAGNOSIS — D649 Anemia, unspecified: Secondary | ICD-10-CM | POA: Diagnosis not present

## 2020-07-08 DIAGNOSIS — L8990 Pressure ulcer of unspecified site, unspecified stage: Secondary | ICD-10-CM | POA: Insufficient documentation

## 2020-07-08 DIAGNOSIS — M4648 Discitis, unspecified, sacral and sacrococcygeal region: Secondary | ICD-10-CM

## 2020-07-08 DIAGNOSIS — G35 Multiple sclerosis: Secondary | ICD-10-CM | POA: Diagnosis not present

## 2020-07-08 DIAGNOSIS — G9340 Encephalopathy, unspecified: Secondary | ICD-10-CM | POA: Diagnosis not present

## 2020-07-08 DIAGNOSIS — N390 Urinary tract infection, site not specified: Secondary | ICD-10-CM | POA: Diagnosis not present

## 2020-07-08 LAB — CBC WITH DIFFERENTIAL/PLATELET
Abs Immature Granulocytes: 0.1 10*3/uL — ABNORMAL HIGH (ref 0.00–0.07)
Basophils Absolute: 0 10*3/uL (ref 0.0–0.1)
Basophils Relative: 0 %
Eosinophils Absolute: 0.3 10*3/uL (ref 0.0–0.5)
Eosinophils Relative: 2 %
HCT: 27.5 % — ABNORMAL LOW (ref 36.0–46.0)
Hemoglobin: 8.1 g/dL — ABNORMAL LOW (ref 12.0–15.0)
Immature Granulocytes: 1 %
Lymphocytes Relative: 11 %
Lymphs Abs: 1.4 10*3/uL (ref 0.7–4.0)
MCH: 25 pg — ABNORMAL LOW (ref 26.0–34.0)
MCHC: 29.5 g/dL — ABNORMAL LOW (ref 30.0–36.0)
MCV: 84.9 fL (ref 80.0–100.0)
Monocytes Absolute: 0.7 10*3/uL (ref 0.1–1.0)
Monocytes Relative: 6 %
Neutro Abs: 9.8 10*3/uL — ABNORMAL HIGH (ref 1.7–7.7)
Neutrophils Relative %: 80 %
Platelets: 517 10*3/uL — ABNORMAL HIGH (ref 150–400)
RBC: 3.24 MIL/uL — ABNORMAL LOW (ref 3.87–5.11)
RDW: 17.9 % — ABNORMAL HIGH (ref 11.5–15.5)
WBC: 12.3 10*3/uL — ABNORMAL HIGH (ref 4.0–10.5)
nRBC: 0 % (ref 0.0–0.2)

## 2020-07-08 LAB — COMPREHENSIVE METABOLIC PANEL
ALT: 7 U/L (ref 0–44)
AST: 12 U/L — ABNORMAL LOW (ref 15–41)
Albumin: 1.6 g/dL — ABNORMAL LOW (ref 3.5–5.0)
Alkaline Phosphatase: 71 U/L (ref 38–126)
Anion gap: 3 — ABNORMAL LOW (ref 5–15)
BUN: 10 mg/dL (ref 8–23)
CO2: 28 mmol/L (ref 22–32)
Calcium: 7.9 mg/dL — ABNORMAL LOW (ref 8.9–10.3)
Chloride: 109 mmol/L (ref 98–111)
Creatinine, Ser: 0.5 mg/dL (ref 0.44–1.00)
GFR, Estimated: >60 mL/min (ref >60)
Glucose, Bld: 80 mg/dL (ref 70–99)
Potassium: 3.2 mmol/L — ABNORMAL LOW (ref 3.5–5.1)
Sodium: 140 mmol/L (ref 135–145)
Total Bilirubin: 0.2 mg/dL — ABNORMAL LOW (ref 0.3–1.2)
Total Protein: 4.7 g/dL — ABNORMAL LOW (ref 6.5–8.1)

## 2020-07-08 LAB — PHOSPHORUS: Phosphorus: 2.4 mg/dL — ABNORMAL LOW (ref 2.5–4.6)

## 2020-07-08 LAB — C-REACTIVE PROTEIN: CRP: 10.7 mg/dL — ABNORMAL HIGH (ref <1.0)

## 2020-07-08 LAB — MAGNESIUM: Magnesium: 1.7 mg/dL (ref 1.7–2.4)

## 2020-07-08 LAB — SEDIMENTATION RATE: Sed Rate: 65 mm/hr — ABNORMAL HIGH (ref 0–22)

## 2020-07-08 LAB — PROCALCITONIN: Procalcitonin: 0.22 ng/mL

## 2020-07-08 MED ORDER — SODIUM CHLORIDE 0.9% FLUSH: 10.0000 mL | INTRAVENOUS | Status: DC | PRN

## 2020-07-08 MED ORDER — IOHEXOL 9 MG/ML PO SOLN
ORAL | Status: AC
  Filled 2020-07-08: qty 1000

## 2020-07-08 MED ORDER — SODIUM CHLORIDE (PF) 0.9 % IJ SOLN
INTRAMUSCULAR | Status: AC
  Filled 2020-07-08: qty 50

## 2020-07-08 MED ORDER — LIP MEDEX EX OINT
TOPICAL_OINTMENT | CUTANEOUS | Status: DC | PRN
  Filled 2020-07-08: qty 7

## 2020-07-08 MED ORDER — POTASSIUM CHLORIDE CRYS ER 20 MEQ PO TBCR
40.0000 meq | EXTENDED_RELEASE_TABLET | Freq: Once | ORAL | Status: AC
  Administered 2020-07-08: 40 meq via ORAL
  Filled 2020-07-08: qty 2

## 2020-07-08 MED ORDER — MAGNESIUM SULFATE 2 GM/50ML IV SOLN
2.0000 g | Freq: Once | INTRAVENOUS | Status: AC
  Administered 2020-07-08: 2 g via INTRAVENOUS
  Filled 2020-07-08: qty 50

## 2020-07-08 MED ORDER — IOHEXOL 9 MG/ML PO SOLN
500.0000 mL | ORAL | Status: AC
  Administered 2020-07-08 (×2): 500 mL via ORAL

## 2020-07-08 MED ORDER — IOHEXOL 300 MG/ML  SOLN
100.0000 mL | Freq: Once | INTRAMUSCULAR | Status: AC | PRN
  Administered 2020-07-08: 100 mL via INTRAVENOUS

## 2020-07-08 NOTE — Consult Note (Signed)
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 It is noted that patient has a recent history of colitis followed by ID (Dr. Baxter Flattery), recent UTIs, and an additional comorbidity of multiple sclerosis, followed by Dr. Chalmers Cater, Neurology, at [facility]. Wound type:Pressure Pressure Injury POA: Yes Measurement:Per Bedside RN P. Gundlach in Nursing Flow Sheet: 6.5cm x 6cm x 3cm Wound bed: Dry, 80% red with 20% yellow nonviable tissue. Purple discoloration of tissue and wound edge at 2 o'clock.  Drainage (amount, consistency, odor) Small amount serous Periwound: Friable, but open wound edge. Dressing procedure/placement/frequency:   It is noted that the Medical POC per ED provider is for consultation with ID and with Surgery for work up to rule out osteomyelitis. I agree with those recommendations.    In the interim, I will provide guidance for the conservative care of the wound using twice daily NS dampened roll gauze to fill the defect, topped with dry gauze and topped with either an ABD and paper tape or a silicone foam dressing. Turning and repositioning per our house protocol with the allowance of minimal time spent in the supine position. A mattress replacement with low air loss feature is provided to mitigate moisture and pressure risks as are Prevalon heel boots bilaterally for the prevention of pressure injuries in those areas.  [facility] nursing team will not follow, but will remain available to this patient, the nursing and medical teams.  Please re-consult if needed. Thanks, Maudie Flakes, MSN, RN, [place], Arther Abbott  Pager# [phone]

## 2020-07-08 NOTE — Progress Notes (Signed)
Patient ID: Renee Huang, female   DOB: May 21, 1956, 65 y.o.   MRN: [id number]  In bed, just got a ct. Dressing has recently been changed. I looked at picture and likely wont need any surgical debridement in or.  Will look tomorrow with dressing change though to make sure and figure out further therapy

## 2020-07-08 NOTE — Progress Notes (Addendum)
PROGRESS NOTE    Renee Huang  CZY:[id number] DOB: [birth year] DOA: 07/07/2020 PCP: [name], DO   Chief Complaint  Patient presents with  . Altered Mental Status   Brief Narrative:  Renee Huang is a 64 y.o. female with history of primary progressive multiple sclerosis presently not on any disease modifying medication was found to be lethargic at the facility and was brought to the ER.  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  Patient had worsening back pain and as per the report was given some hydrocodone following which patient became more lethargic.  It was told that patient was hypotensive.  ED Course: In the ER patient is mildly lethargic but answering questions appropriately and he is giving good history.  UA is concerning for UTI.  Patient is maintaining his sats.  Patient states she took some hydrocodone after she had some worsening back pain yesterday following which she became more confused.  Patient denies any headache nausea vomiting or abdominal pain.  In the ER patient had one episode of loose stools.  Labs showing worsening anemia usually hemoglobin is around 9 at baseline is around 7 at this time.  Stool did not look melanotic in the ER as per the ER physician.  Patient was started on ceftriaxone for UTI admitted for lethargy/acute encephalopathy could be related to pain medication although could be secondary to UTI.  Covid test was negative.  Assessment & Plan:  Acute metabolic encephalopathy It looks like the precipitant event was administration of hydrocodone that was given at her nursing facility for back pain.  Patient was noted to become more lethargic after given this pain medication.  Infection also likely contributing including UTI as well as infected stage IV decubitus ulcer.  Patient also noted to have osteomyelitis as discussed below.  Patient is on Norco and tizanidine on hold.  Baclofen is being continued.  Ammonia  level was normal.  B12 level was normal.  HIV nonreactive.  Back to baseline now.  Continue to monitor.  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  Wound care is following.  Patient currently on vancomycin ceftriaxone and metronidazole.  No imaging studies noted in the system.  We will proceed with CT scan of the abdomen and pelvis.  We will also need to be seen by infectious disease at some point in time. Procalcitonin 0.22.  CRP 10.7.  Possible UTI Follow-up cultures.  Continue with antibiotics as discussed above.  Recurrent C. difficile colitis On oral vancomycin.  Enteric precautions.  On an extended course of oral vancomycin while she is on other antibacterials.  Normocytic anemia/folate deficiency/iron deficiency Continue supplementation.  Monitor hemoglobins.  No evidence for overt bleeding.  Hypokalemia Will be repleted.  Magnesium 1.7 which will also be repleted.  Hypoalbuminemia Likely due to poor nutrition.  Hyperlipidemia Statin.  Primary progressive multiple sclerosis This is the reason for her bedbound status.   DVT prophylaxis: lovenox Code Status: full  Family Communication: unable to reach  Disposition:   Status is: Observation  The patient will require care spanning > 2 midnights and should be moved to inpatient because: Inpatient level of care appropriate due to severity of illness  Dispo: The patient is from: SNF              Anticipated d/c is to: SNF              Anticipated d/c date  is: > 3 days              Patient currently is not medically stable to d/c.  Consultants:   none  Procedures:   none  Antimicrobials:  Anti-infectives (From admission, onward)   Start     Dose/Rate Route Frequency Ordered Stop   08/12/20 1000  vancomycin (VANCOCIN) 50 mg/mL oral solution 125 mg       "Followed by" Linked Group Details   125 mg Oral Every 3 DAYS 07/07/20 0557 08/21/20 0959    08/04/20 1000  vancomycin (VANCOCIN) 50 mg/mL oral solution 125 mg       "Followed by" Linked Group Details   125 mg Oral Every other day 07/07/20 0557 08/12/20 0959   07/28/20 1000  vancomycin (VANCOCIN) 50 mg/mL oral solution 125 mg       "Followed by" Linked Group Details   125 mg Oral Daily 07/07/20 0557 08/04/20 0959   07/21/20 1000  vancomycin (VANCOCIN) 50 mg/mL oral solution 125 mg       "Followed by" Linked Group Details   125 mg Oral 2 times daily 07/07/20 0557 07/28/20 0959   07/08/20 0600  cefTRIAXone (ROCEPHIN) 1 g in sodium chloride 0.9 % 100 mL IVPB  Status:  Discontinued        1 g 200 mL/hr over 30 Minutes Intravenous Every 24 hours 07/07/20 0540 07/07/20 2016   07/07/20 2200  metroNIDAZOLE (FLAGYL) tablet 500 mg        500 mg Oral Every 8 hours 07/07/20 1932     07/07/20 2200  cefTRIAXone (ROCEPHIN) 2 g in sodium chloride 0.9 % 100 mL IVPB        2 g 200 mL/hr over 30 Minutes Intravenous Every 24 hours 07/07/20 2016     07/07/20 2100  vancomycin (VANCOREADY) IVPB 1250 mg/250 mL        1,250 mg 166.7 mL/hr over 90 Minutes Intravenous Daily 07/07/20 1947     07/07/20 1000  vancomycin (VANCOCIN) 50 mg/mL oral solution 125 mg       "Followed by" Linked Group Details   125 mg Oral 4 times daily 07/07/20 0557 07/21/20 0959   07/07/20 0245  cefTRIAXone (ROCEPHIN) 1 g in sodium chloride 0.9 % 100 mL IVPB        1 g 200 mL/hr over 30 Minutes Intravenous  Once 07/07/20 0237 07/07/20 0635      Subjective: Patient complains of back pain.  Denies any new complaints.  She mentions that she feels like her mentation is back to normal.  Objective: Vitals:   07/08/20 0300 07/08/20 0400 07/08/20 0433 07/08/20 0830  BP: (!) 119/54 (!) 126/57 (!) 121/52 (!) 134/52  Pulse: 92 81 80 (!) 101  Resp: (!) 21 16 16 18   Temp:   98 F (36.7 C) 97.8 F (36.6 C)  TempSrc:   Oral Oral  SpO2: 99% 98% 98% 98%  Weight:      Height:        Intake/Output Summary (Last 24 hours) at  07/08/2020 1110 Last data filed at 07/07/2020 2100 Gross per 24 hour  Intake 120 ml  Output --  Net 120 ml   Filed Weights   07/07/20 0112  Weight: 61.2 kg    Examination:  General appearance: Awake alert.  In no distress Resp: Clear to auscultation bilaterally.  Normal effort Cardio: S1-S2 is normal regular.  No S3-S4.  No rubs murmurs or bruit GI: Abdomen is soft.  Nontender nondistended.  Bowel sounds are present normal.  No masses organomegaly Extremities: Mild edema bilateral lower extremity Neurologic: Alert and oriented x3.  No focal neurological deficits.     Data Reviewed: I have personally reviewed following labs and imaging studies  CBC: Recent Labs  Lab 07/02/20 0000 07/04/20 0000 07/07/20 0102 07/07/20 0605 07/08/20 0915  WBC 18.1 17.6 14.6* 18.1* 12.3*  NEUTROABS  --   --  11.6* 14.3* 9.8*  HGB 10.1* 9.5* 7.8* 8.9* 8.1*  HCT 33* 29* 26.4* 29.5* 27.5*  MCV  --   --  85.7 85.3 84.9  PLT 551* 555* 465* 630* 517*    Basic Metabolic Panel: Recent Labs  Lab 07/02/20 0000 07/07/20 0102 07/07/20 0605 07/08/20 0915  NA 137 139 141 140  K 5.1 4.0 3.6 3.2*  CL 104 105 105 109  CO2 23* 26 26 28   GLUCOSE  --  88 73 80  BUN  --  18 15 10   CREATININE 0.6 0.62 0.58 0.50  CALCIUM 8.9 7.8* 8.1* 7.9*  MG  --   --   --  1.7  PHOS  --   --   --  2.4*    GFR: Estimated Creatinine Clearance: 63.9 mL/min (by C-G formula based on SCr of 0.5 mg/dL).  Liver Function Tests: Recent Labs  Lab 07/07/20 0102 07/07/20 0605 07/08/20 0915  AST 10* 13* 12*  ALT 7 7 7   ALKPHOS 75 84 71  BILITOT 0.5 0.5 0.2*  PROT 4.5* 5.1* 4.7*  ALBUMIN 1.6* 1.8* 1.6*     Recent Results (from the past 240 hour(s))  Blood culture (routine single)     Status: None (Preliminary result)   Collection Time: 07/07/20  1:02 AM   Specimen: BLOOD  Result Value Ref Range Status   Specimen Description   Final    BLOOD RIGHT WRIST Performed at [facility]  [address], [place], [place] [address]    Special Requests   Final    BOTTLES DRAWN AEROBIC AND ANAEROBIC Blood Culture adequate volume Performed at [facility] [address], [place], [place] [address]    Culture   Final    NO GROWTH 1 DAY Performed at [facility], [place] [address] [place], [place] [address]    Report Status PENDING  Incomplete  Urine culture     Status: None (Preliminary result)   Collection Time: 07/07/20  1:02 AM   Specimen: In/Out Cath Urine  Result Value Ref Range Status   Specimen Description   Final    IN/OUT CATH URINE Performed at [facility] [address], [place], [place] [address]    Special Requests   Final    NONE Performed at [facility], [place] [address] [place],  [address]    Culture   Final    CULTURE REINCUBATED FOR BETTER GROWTH Performed at [facility], [place] [address], [place],  [address]    Report Status PENDING  Incomplete  Resp Panel by RT-PCR (Flu A&B, Covid) Nasopharyngeal Swab     Status: None   Collection Time: 07/07/20  5:17 AM   Specimen: Nasopharyngeal Swab; Nasopharyngeal(NP) swabs in vial transport medium  Result Value Ref Range Status   SARS Coronavirus 2 by RT PCR NEGATIVE NEGATIVE Final    Comment: (NOTE) SARS-CoV-2 target nucleic acids are NOT DETECTED.  The SARS-CoV-2 RNA is generally detectable in upper respiratory specimens during the acute phase of infection. The lowest  concentration of SARS-CoV-2 viral copies this assay can detect is 138 copies/mL. A negative result does not preclude SARS-Cov-2 infection and should not be used as the sole basis for treatment or other patient management decisions. A negative result may occur with  improper specimen collection/handling, submission of specimen other than nasopharyngeal swab, presence of viral mutation(s) within the areas targeted by this assay, and inadequate number of  viral copies(<138 copies/mL). A negative result must be combined with clinical observations, patient history, and epidemiological information. The expected result is Negative.  Fact Sheet for Patients:  EntrepreneurPulse.com.au  Fact Sheet for Healthcare Providers:  IncredibleEmployment.be  This test is no t yet approved or cleared by the [place] FDA and  has been authorized for detection and/or diagnosis of SARS-CoV-2 by FDA under an Emergency Use Authorization (EUA). This EUA will remain  in effect (meaning this test can be used) for the duration of the COVID-19 declaration under Section 564(b)(1) of the Act, 21 U.S.C.section 360bbb-3(b)(1), unless the authorization is terminated  or revoked sooner.       Influenza A by PCR NEGATIVE NEGATIVE Final   Influenza B by PCR NEGATIVE NEGATIVE Final    Comment: (NOTE) The Xpert Xpress SARS-CoV-2/FLU/RSV plus assay is intended as an aid in the diagnosis of influenza from Nasopharyngeal swab specimens and should not be used as a sole basis for treatment. Nasal washings and aspirates are unacceptable for Xpert Xpress SARS-CoV-2/FLU/RSV testing.  Fact Sheet for Patients: EntrepreneurPulse.com.au  Fact Sheet for Healthcare Providers: IncredibleEmployment.be  This test is not yet approved or cleared by the [place] FDA and has been authorized for detection and/or diagnosis of SARS-CoV-2 by FDA under an Emergency Use Authorization (EUA). This EUA will remain in effect (meaning this test can be used) for the duration of the COVID-19 declaration under Section 564(b)(1) of the Act, 21 U.S.C. section 360bbb-3(b)(1), unless the authorization is terminated or revoked.  Performed at [facility], [place] [address] [place], [place] [address]          Radiology Studies: [facility] Chest Port 1 View  Result Date: 07/07/2020 CLINICAL DATA:   Possible overdose. EXAM: PORTABLE CHEST 1 VIEW COMPARISON:  January 19, 2020 FINDINGS: A right-sided PICC line is seen with its distal tip noted at the junction of the superior vena cava and right atrium. There is no evidence of acute infiltrate. A small left pleural effusion is seen. No pneumothorax is identified. The heart size and mediastinal contours are within normal limits. The visualized skeletal structures are unremarkable. IMPRESSION: 1. Right-sided PICC line positioning, as described above. 2. Small left pleural effusion. Electronically Signed   By: Virgina Norfolk M.D.   On: 07/07/2020 02:40        Scheduled Meds: . vitamin C  500 mg Oral BID  . baclofen  20 mg Oral TID with meals  . baclofen  30 mg Oral QHS  . Chlorhexidine Gluconate Cloth  6 each Topical Daily  . cholecalciferol  6,000 Units Oral Daily  . dicyclomine  20 mg Oral TID AC  . enoxaparin (LOVENOX) injection  40 mg Subcutaneous Q24H  . feeding supplement (PRO-STAT SUGAR FREE 64)  30 mL Oral BID  . ferrous sulfate  325 mg Oral Q breakfast  . folic acid  1 mg Oral Daily  . loratadine  10 mg Oral Daily  . metroNIDAZOLE  500 mg Oral Q8H  . mirtazapine  7.5 mg Oral QHS  . potassium chloride  10 mEq Oral Daily  .  pravastatin  40 mg Oral Daily  . tamsulosin  0.4 mg Oral Daily  . tiZANidine  4 mg Oral QHS  . vancomycin  125 mg Oral QID   Followed by  . [START ON 07/21/2020] vancomycin  125 mg Oral BID   Followed by  . [START ON 07/28/2020] vancomycin  125 mg Oral Daily   Followed by  . [START ON 08/04/2020] vancomycin  125 mg Oral QODAY   Followed by  . [START ON 08/12/2020] vancomycin  125 mg Oral Q3 days  . vitamin B-12  1,000 mcg Oral Daily   Continuous Infusions: . cefTRIAXone (ROCEPHIN)  IV 1 g (07/08/20 0446)  . vancomycin 1,250 mg (07/07/20 2133)     LOS: 1 day     Bonnielee Haff, MD [facility]   To contact the attending provider between 7A-7P or the covering provider during after hours  7P-7A, please log into the web site [email or web address] and access using universal [place] password for that web site. If you do not have the password, please call the hospital operator.  07/08/2020, 11:10 AM

## 2020-07-09 ENCOUNTER — Inpatient Hospital Stay (HOSPITAL_COMMUNITY): Payer: Medicare Other

## 2020-07-09 DIAGNOSIS — G35 Multiple sclerosis: Secondary | ICD-10-CM | POA: Diagnosis not present

## 2020-07-09 DIAGNOSIS — G9340 Encephalopathy, unspecified: Secondary | ICD-10-CM

## 2020-07-09 DIAGNOSIS — A0471 Enterocolitis due to Clostridium difficile, recurrent: Secondary | ICD-10-CM

## 2020-07-09 DIAGNOSIS — D649 Anemia, unspecified: Secondary | ICD-10-CM | POA: Diagnosis not present

## 2020-07-09 DIAGNOSIS — L89154 Pressure ulcer of sacral region, stage 4: Secondary | ICD-10-CM | POA: Diagnosis not present

## 2020-07-09 DIAGNOSIS — R410 Disorientation, unspecified: Secondary | ICD-10-CM

## 2020-07-09 DIAGNOSIS — M869 Osteomyelitis, unspecified: Secondary | ICD-10-CM

## 2020-07-09 DIAGNOSIS — N390 Urinary tract infection, site not specified: Secondary | ICD-10-CM | POA: Diagnosis not present

## 2020-07-09 DIAGNOSIS — M7989 Other specified soft tissue disorders: Secondary | ICD-10-CM | POA: Diagnosis not present

## 2020-07-09 DIAGNOSIS — M4628 Osteomyelitis of vertebra, sacral and sacrococcygeal region: Principal | ICD-10-CM

## 2020-07-09 LAB — COMPREHENSIVE METABOLIC PANEL
ALT: 8 U/L (ref 0–44)
AST: 11 U/L — ABNORMAL LOW (ref 15–41)
Albumin: 1.7 g/dL — ABNORMAL LOW (ref 3.5–5.0)
Alkaline Phosphatase: 68 U/L (ref 38–126)
Anion gap: 9 (ref 5–15)
BUN: 10 mg/dL (ref 8–23)
CO2: 24 mmol/L (ref 22–32)
Calcium: 7.9 mg/dL — ABNORMAL LOW (ref 8.9–10.3)
Chloride: 108 mmol/L (ref 98–111)
Creatinine, Ser: 0.48 mg/dL (ref 0.44–1.00)
GFR, Estimated: >60 mL/min (ref >60)
Glucose, Bld: 70 mg/dL (ref 70–99)
Potassium: 3.3 mmol/L — ABNORMAL LOW (ref 3.5–5.1)
Sodium: 141 mmol/L (ref 135–145)
Total Bilirubin: 0.4 mg/dL (ref 0.3–1.2)
Total Protein: 4.6 g/dL — ABNORMAL LOW (ref 6.5–8.1)

## 2020-07-09 LAB — CBC
HCT: 26.3 % — ABNORMAL LOW (ref 36.0–46.0)
Hemoglobin: 8 g/dL — ABNORMAL LOW (ref 12.0–15.0)
MCH: 26 pg (ref 26.0–34.0)
MCHC: 30.4 g/dL (ref 30.0–36.0)
MCV: 85.4 fL (ref 80.0–100.0)
Platelets: 483 10*3/uL — ABNORMAL HIGH (ref 150–400)
RBC: 3.08 MIL/uL — ABNORMAL LOW (ref 3.87–5.11)
RDW: 18 % — ABNORMAL HIGH (ref 11.5–15.5)
WBC: 11.4 10*3/uL — ABNORMAL HIGH (ref 4.0–10.5)
nRBC: 0 % (ref 0.0–0.2)

## 2020-07-09 LAB — PROCALCITONIN: Procalcitonin: 0.27 ng/mL

## 2020-07-09 MED ORDER — POLYETHYLENE GLYCOL 3350 17 G PO PACK
17.0000 g | PACK | Freq: Two times a day (BID) | ORAL | Status: DC
  Filled 2020-07-09: qty 1

## 2020-07-09 MED ORDER — COLLAGENASE 250 UNIT/GM EX OINT
TOPICAL_OINTMENT | Freq: Two times a day (BID) | CUTANEOUS | Status: DC
  Filled 2020-07-09 (×2): qty 30

## 2020-07-09 MED ORDER — SODIUM CHLORIDE 0.9 % IV SOLN
3.0000 g | Freq: Four times a day (QID) | INTRAVENOUS | Status: DC
  Administered 2020-07-09 – 2020-07-13 (×16): 3 g via INTRAVENOUS
  Filled 2020-07-09: qty 3
  Filled 2020-07-09: qty 8
  Filled 2020-07-09: qty 3
  Filled 2020-07-09 (×7): qty 8
  Filled 2020-07-09: qty 3
  Filled 2020-07-09 (×6): qty 8

## 2020-07-09 MED ORDER — SENNA 8.6 MG PO TABS
2.0000 | ORAL_TABLET | Freq: Every day | ORAL | Status: DC
  Administered 2020-07-10: 17.2 mg via ORAL
  Filled 2020-07-09: qty 2

## 2020-07-09 MED ORDER — VANCOMYCIN 50 MG/ML ORAL SOLUTION
125.0000 mg | Freq: Two times a day (BID) | ORAL | Status: DC
  Administered 2020-07-09 – 2020-07-13 (×8): 125 mg via ORAL
  Filled 2020-07-09 (×8): qty 2.5

## 2020-07-09 NOTE — Consult Note (Signed)
Date of Admission:  07/07/2020          Reason for Consult: Large decubitus ulcer with osteomyelitis in patient w recurrent CDI     Referring Provider: Dr. Maryland Pink   Assessment:  1. 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 Recurrent CDI 3. MS 4. Admission with confusion per pt due to opiates 5. No ssx of UTI 6. Depression  Plan:  1. Switch back to unasyn as per original plan x 4 weeks 2. Oral vancomycin 125mg  po BID to try to prevent CDI occurring 3. Could consider bone biopsy but of limited sensitivity given all of the antibiotics the patient has already received  Principal Problem:   Acute encephalopathy Active Problems:   Hypertension   Multiple sclerosis, primary progressive ([place])   Recurrent colitis due to Clostridium difficile   Anemia   Acute lower UTI   Acute metabolic encephalopathy   Pressure injury of skin   Scheduled Meds: . vitamin C  500 mg Oral BID  . baclofen  20 mg Oral TID with meals  . baclofen  30 mg Oral QHS  . Chlorhexidine Gluconate Cloth  6 each Topical Daily  . cholecalciferol  6,000 Units Oral Daily  . collagenase   Topical BID  . dicyclomine  20 mg Oral TID AC  . enoxaparin (LOVENOX) injection  40 mg Subcutaneous Q24H  . feeding supplement (PRO-STAT SUGAR FREE 64)  30 mL Oral BID  . ferrous sulfate  325 mg Oral Q breakfast  . folic acid  1 mg Oral Daily  . loratadine  10 mg Oral Daily  . mirtazapine  7.5 mg Oral QHS  . polyethylene glycol  17 g Oral BID  . potassium chloride  10 mEq Oral Daily  . pravastatin  40 mg Oral Daily  . senna  2 tablet Oral QHS  . tamsulosin  0.4 mg Oral Daily  . tiZANidine  4 mg Oral QHS  . vancomycin  125 mg Oral Q12H  . vitamin B-12  1,000 mcg Oral Daily   Continuous Infusions: . ampicillin-sulbactam (UNASYN) IV     PRN Meds:.lip balm, LORazepam, sodium chloride flush  HPI: Renee Huang is a 64 y.o. female with MS and recurrent CDI followed by my partner Dr Baxter Flattery for  this. The patient had developed a decubitus ulcer that now probes to bone.  Dr Mariea Clonts at [facility] had discussed with my partner Dr. Baxter Flattery and they had started IV unasyn with plan for bone biopsy for cultures. The patient was also started on prophylactic po vancomycin for CDI having recently completed a pulse ant taper along with zinplava infusion.  IN the interim the patient ahd developed conufsion  And lethargy that she tells me she believes was due to excess of opiate for pain. Because she had pyuria in ER there was concerns for UTI. Patient has subsequently been started on ceftriaxone, IV vancomycin and po vancomycin. CT scan done on admission indeed shows coccygeal osteomyelitis underlying her decubitus ulcer.  [facility] have seen the patient and see no indication for debridement from their standpoint.   Bone biopsy originally planned seems of low yield at this point.  Would go back to IV unasyn and aim for [redacted] weeks along with continued po vancomycin in prophylactic type dose  I will arrange for HSFU for her in our clinic.   Review of Systems: Review of Systems  Constitutional: Negative for chills, diaphoresis, fever, malaise/fatigue and weight loss.  HENT: Negative for congestion,  hearing loss, sore throat and tinnitus.   Eyes: Negative for blurred vision and double vision.  Respiratory: Negative for cough, sputum production, shortness of breath and wheezing.   Cardiovascular: Negative for chest pain, palpitations and leg swelling.  Gastrointestinal: Negative for abdominal pain, blood in stool, constipation, diarrhea, heartburn, melena, nausea and vomiting.  Genitourinary: Negative for dysuria, flank pain and hematuria.  Musculoskeletal: Positive for back pain. Negative for falls, joint pain and myalgias.  Skin: Negative for itching and rash.  Neurological: Negative for dizziness, sensory change, focal weakness, loss of consciousness, weakness and headaches.  Endo/Heme/Allergies: Does not  bruise/bleed easily.  Psychiatric/Behavioral: Positive for depression. Negative for memory loss and suicidal ideas. The patient is nervous/anxious.     Past Medical History:  Diagnosis Date  . Actinic keratosis   . BCC (basal cell carcinoma of skin)   . Closed fracture of second metatarsal bone of right foot   . Closed fracture of third metatarsal bone of right foot with routine healing   . IBS (irritable bowel syndrome)   . Multiple sclerosis ([place])   . Neurogenic bladder   . Rectal polyp    Tubular adenoma  . Recurrent UTI   . SCC (squamous cell carcinoma)     Social History   Tobacco Use  . Smoking status: Never Smoker  . Smokeless tobacco: Never Used  Vaping Use  . Vaping Use: Never used  Substance Use Topics  . Alcohol use: Yes    Alcohol/week: 18.0 standard drinks    Types: 2 Glasses of wine, 16 Standard drinks or equivalent per week  . Drug use: Never    Family History  Problem Relation Age of Onset  . Hypertension Mother   . Hyperlipidemia Mother   . Breast cancer Mother   . Hypertension Father   . Heart attack Father   . Hypertension Brother   . Diabetes Brother   . Lung cancer Maternal Grandmother   . Heart attack Paternal Grandfather    No Known Allergies  OBJECTIVE: Blood pressure 135/78, pulse 85, temperature 98 F (36.7 C), temperature source Oral, resp. rate 20, height 5\' 5"  (1.651 m), weight 61.2 kg, SpO2 97 %.  Physical Exam Constitutional:      General: She is not in acute distress.    Appearance: She is not diaphoretic.  HENT:     Head: Normocephalic and atraumatic.     Right Ear: External ear normal.     Left Ear: External ear normal.     Nose: Nose normal.     Mouth/Throat:     Pharynx: No oropharyngeal exudate.  Eyes:     General: No scleral icterus.    Conjunctiva/sclera: Conjunctivae normal.  Cardiovascular:     Rate and Rhythm: Normal rate and regular rhythm.     Heart sounds: Normal heart sounds. No murmur heard.  No  friction rub. No gallop.   Pulmonary:     Effort: Pulmonary effort is normal. No respiratory distress.     Breath sounds: Normal breath sounds. No stridor. No wheezing or rales.  Abdominal:     General: Bowel sounds are normal. There is no distension.     Palpations: Abdomen is soft.  Musculoskeletal:        General: No tenderness. Normal range of motion.     Cervical back: Normal range of motion and neck supple.  Lymphadenopathy:     Cervical: No cervical adenopathy.  Skin:    General: Skin is warm and dry.  Coloration: Skin is not pale.     Findings: No erythema or rash.  Neurological:     Mental Status: She is alert and oriented to person, place, and time.     Coordination: Coordination normal.  Psychiatric:        Attention and Perception: Attention normal.        Mood and Affect: Mood is anxious and depressed.        Speech: Speech normal.        Behavior: Behavior normal.        Thought Content: Thought content normal.        Cognition and Memory: Cognition normal.        Judgment: Judgment normal.    Wound not examined but pictures from [facility] reviewed  Lab Results Lab Results  Component Value Date   WBC 11.4 (H) 07/09/2020   HGB 8.0 (L) 07/09/2020   HCT 26.3 (L) 07/09/2020   MCV 85.4 07/09/2020   PLT 483 (H) 07/09/2020    Lab Results  Component Value Date   CREATININE 0.48 07/09/2020   BUN 10 07/09/2020   NA 141 07/09/2020   K 3.3 (L) 07/09/2020   CL 108 07/09/2020   CO2 24 07/09/2020    Lab Results  Component Value Date   ALT 8 07/09/2020   AST 11 (L) 07/09/2020   ALKPHOS 68 07/09/2020   BILITOT 0.4 07/09/2020     Microbiology: Recent Results (from the past 240 hour(s))  Blood culture (routine single)     Status: None (Preliminary result)   Collection Time: 07/07/20  1:02 AM   Specimen: BLOOD  Result Value Ref Range Status   Specimen Description   Final    BLOOD RIGHT WRIST Performed at [facility] [address],  [place], [place] [address]    Special Requests   Final    BOTTLES DRAWN AEROBIC AND ANAEROBIC Blood Culture adequate volume Performed at [facility] [address], [place], [place] [address]    Culture   Final    NO GROWTH 2 DAYS Performed at [facility] [address], [place], [place] [address]    Report Status PENDING  Incomplete  Urine culture     Status: Abnormal (Preliminary result)   Collection Time: 07/07/20  1:02 AM   Specimen: In/Out Cath Urine  Result Value Ref Range Status   Specimen Description   Final    IN/OUT CATH URINE Performed at [facility] [address], [place], [place] [address]    Special Requests   Final    NONE Performed at [facility], [place] [address] [place], [place] [address]    Culture (A)  Final    >=100,000 COLONIES/mL PSEUDOMONAS AERUGINOSA SUSCEPTIBILITIES TO FOLLOW CULTURE REINCUBATED FOR BETTER GROWTH Performed at [facility], [place] [address], [place], [place] [address]    Report Status PENDING  Incomplete  Resp Panel by RT-PCR (Flu A&B, Covid) Nasopharyngeal Swab     Status: None   Collection Time: 07/07/20  5:17 AM   Specimen: Nasopharyngeal Swab; Nasopharyngeal(NP) swabs in vial transport medium  Result Value Ref Range Status   SARS Coronavirus 2 by RT PCR NEGATIVE NEGATIVE Final    Comment: (NOTE) SARS-CoV-2 target nucleic acids are NOT DETECTED.  The SARS-CoV-2 RNA is generally detectable in upper respiratory specimens during the acute phase of infection. The lowest concentration of SARS-CoV-2 viral copies this assay can detect is 138 copies/mL. A  negative result does not preclude SARS-Cov-2 infection and should not be used as the sole basis for treatment or other patient management decisions. A negative result may occur with  improper specimen collection/handling, submission of specimen other than nasopharyngeal swab, presence of viral mutation(s) within  the areas targeted by this assay, and inadequate number of viral copies(<138 copies/mL). A negative result must be combined with clinical observations, patient history, and epidemiological information. The expected result is Negative.  Fact Sheet for Patients:  EntrepreneurPulse.com.au  Fact Sheet for Healthcare Providers:  IncredibleEmployment.be  This test is no t yet approved or cleared by the [place] FDA and  has been authorized for detection and/or diagnosis of SARS-CoV-2 by FDA under an Emergency Use Authorization (EUA). This EUA will remain  in effect (meaning this test can be used) for the duration of the COVID-19 declaration under Section 564(b)(1) of the Act, 21 U.S.C.section 360bbb-3(b)(1), unless the authorization is terminated  or revoked sooner.       Influenza A by PCR NEGATIVE NEGATIVE Final   Influenza B by PCR NEGATIVE NEGATIVE Final    Comment: (NOTE) The Xpert Xpress SARS-CoV-2/FLU/RSV plus assay is intended as an aid in the diagnosis of influenza from Nasopharyngeal swab specimens and should not be used as a sole basis for treatment. Nasal washings and aspirates are unacceptable for Xpert Xpress SARS-CoV-2/FLU/RSV testing.  Fact Sheet for Patients: EntrepreneurPulse.com.au  Fact Sheet for Healthcare Providers: IncredibleEmployment.be  This test is not yet approved or cleared by the [place] FDA and has been authorized for detection and/or diagnosis of SARS-CoV-2 by FDA under an Emergency Use Authorization (EUA). This EUA will remain in effect (meaning this test can be used) for the duration of the COVID-19 declaration under Section 564(b)(1) of the Act, 21 U.S.C. section 360bbb-3(b)(1), unless the authorization is terminated or revoked.  Performed at [facility], [place] [address], [place], [place] [address]     Alcide Evener, [place]  [facility] [phone] pager  07/09/2020, 3:11 PM

## 2020-07-09 NOTE — Progress Notes (Signed)
Bilateral lower extremity venous duplex has been completed. Preliminary results can be found in CV Proc through chart review.   07/09/20 9:26 AM Carlos Levering RVT

## 2020-07-09 NOTE — Progress Notes (Addendum)
PHARMACY CONSULT NOTE FOR:  OUTPATIENT  PARENTERAL ANTIBIOTIC THERAPY (OPAT)  Indication: large decubitus ulcer with osteomyelitis Regimen: Unasyn 3g IV q6 End date: 08/02/20  IV antibiotic discharge orders are pended. To discharging provider:  please sign these orders via discharge navigator,  Select New Orders & click on the button choice - Manage This Unsigned Work.     Thank you for allowing pharmacy to be a part of this patient's care.  Kara Mead 07/09/2020, 3:54 PM

## 2020-07-09 NOTE — Progress Notes (Signed)
PROGRESS NOTE    Renee Huang  GYJ:[id number] DOB: March 25, 1956 DOA: 07/07/2020 PCP: [name], DO   Chief Complaint  Patient presents with  . Altered Mental Status   Brief Narrative:  Renee Huang is a 64 y.o. female with history of primary progressive multiple sclerosis presently not on any disease modifying medication was found to be lethargic at the facility and was brought to the ER.  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  Patient had worsening back pain and as per the report was given some hydrocodone following which patient became more lethargic.  It was told that patient was hypotensive.  ED Course: In the ER patient is mildly lethargic but answering questions appropriately and he is giving good history.  UA is concerning for UTI.  Patient is maintaining his sats.  Patient states she took some hydrocodone after she had some worsening back pain yesterday following which she became more confused.  Patient denies any headache nausea vomiting or abdominal pain.  In the ER patient had one episode of loose stools.  Labs showing worsening anemia usually hemoglobin is around 9 at baseline is around 7 at this time.  Stool did not look melanotic in the ER as per the ER physician.  Patient was started on ceftriaxone for UTI admitted for lethargy/acute encephalopathy could be related to pain medication although could be secondary to UTI.  Covid test was negative.  Assessment & Plan:  Acute metabolic encephalopathy It looks like the precipitant event was administration of hydrocodone that was given at her nursing facility for back pain.  Patient was noted to become more lethargic after given this pain medication.  Infection also likely contributing including UTI as well as infected stage IV decubitus ulcer.  Patient's Norco and tizanidine on hold.  Baclofen is being continued.  Ammonia level was normal.  B12 level was normal.  HIV nonreactive.    Mental status seems to be back to baseline now.  Continue to monitor.   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  General surgery consulted.  Continue vancomycin and ceftriaxone and metronidazole.  CT scan of the abdomen pelvis was done overnight and showed a large sacral decubitus ulcer with findings consistent with osteomyelitis involving the underlying coccyx.  Apparently these findings are new compared to CT scan done in February which was not done in our system.  We will wait for general surgery input regarding debridement.  Will need to consult infectious disease also.  Continue with antibiotics.  WBC noted to be better.  Procalcitonin 0.27.  CRP was 10.7.    Suspected DVT in the right common femoral vein Noted on the CT scan.  Lower extremity Doppler studies ordered.  Patient denies any previous history of blood clots.  She is however bedridden due to her history of MS.  Severe bilateral hydroureteronephrosis Thought to be secondary to chronic bladder outlet obstruction.  Noted on previous CT scans as well.  Patient has been able to pass urine.  Noted to be on Flomax which will be continued.  Diffuse bladder wall thickening was also noted raising concern for cystitis.  See below.  Patient on antibiotics.  Urinary tract infection with Pseudomonas Urine culture growing Pseudomonas.  Waiting on final sensitivities.  Continue with antibiotics as discussed above.  Recurrent C. difficile colitis On oral vancomycin.  Enteric precautions.  On an extended course of oral vancomycin while she is on other  antibacterials.  Normocytic anemia/folate deficiency/iron deficiency Continue supplementation.  Monitor hemoglobins.  No evidence for overt bleeding.  Pains stable.  Hypokalemia Replace potassium.  Magnesium was 1.7 yesterday which was replaced as well.  We will recheck tomorrow.    Hypoalbuminemia Likely due to poor  nutrition.  Hyperlipidemia Statin.  Primary progressive multiple sclerosis This is the reason for her bedbound status.  Constipation Large amount of stool noted throughout the colon.  Will place her on a bowel regimen.  Trace bilateral pleural effusions Not significant.  No respiratory issues currently.  DVT prophylaxis: lovenox Code Status: full  Family Communication: None at bedside Disposition: From skilled nursing facility.  Hopefully discharge back to facility once improved.  Status is: Inpatient  Remains inpatient appropriate because:IV treatments appropriate due to intensity of illness or inability to take PO and Inpatient level of care appropriate due to severity of illness   Dispo:  Patient From: Home  Planned Disposition: [facility]  Expected discharge date: 07/11/20  Medically stable for discharge: No     Consultants:   General surgery  Procedures:   none  Antimicrobials:  Anti-infectives (From admission, onward)   Start     Dose/Rate Route Frequency Ordered Stop   08/12/20 1000  vancomycin (VANCOCIN) 50 mg/mL oral solution 125 mg       "Followed by" Linked Group Details   125 mg Oral Every 3 DAYS 07/07/20 0557 08/21/20 0959   08/04/20 1000  vancomycin (VANCOCIN) 50 mg/mL oral solution 125 mg       "Followed by" Linked Group Details   125 mg Oral Every other day 07/07/20 0557 08/12/20 0959   07/28/20 1000  vancomycin (VANCOCIN) 50 mg/mL oral solution 125 mg       "Followed by" Linked Group Details   125 mg Oral Daily 07/07/20 0557 08/04/20 0959   07/21/20 1000  vancomycin (VANCOCIN) 50 mg/mL oral solution 125 mg       "Followed by" Linked Group Details   125 mg Oral 2 times daily 07/07/20 0557 07/28/20 0959   07/08/20 0600  cefTRIAXone (ROCEPHIN) 1 g in sodium chloride 0.9 % 100 mL IVPB  Status:  Discontinued        1 g 200 mL/hr over 30 Minutes Intravenous Every 24 hours 07/07/20 0540 07/07/20 2016   07/07/20 2200  metroNIDAZOLE  (FLAGYL) tablet 500 mg        500 mg Oral Every 8 hours 07/07/20 1932     07/07/20 2200  cefTRIAXone (ROCEPHIN) 2 g in sodium chloride 0.9 % 100 mL IVPB        2 g 200 mL/hr over 30 Minutes Intravenous Every 24 hours 07/07/20 2016     07/07/20 2100  vancomycin (VANCOREADY) IVPB 1250 mg/250 mL        1,250 mg 166.7 mL/hr over 90 Minutes Intravenous Daily 07/07/20 1947     07/07/20 1000  vancomycin (VANCOCIN) 50 mg/mL oral solution 125 mg       "Followed by" Linked Group Details   125 mg Oral 4 times daily 07/07/20 0557 07/21/20 0959   07/07/20 0245  cefTRIAXone (ROCEPHIN) 1 g in sodium chloride 0.9 % 100 mL IVPB        1 g 200 mL/hr over 30 Minutes Intravenous  Once 07/07/20 0237 07/07/20 5852      Subjective: Patient states that she is feeling better.  Continues to have lower back pain.  No abdominal pain nausea or vomiting.  Denies any shortness of breath.  Objective: Vitals:   07/08/20 0830 07/08/20 1323 07/08/20 2058 07/09/20 0654  BP: (!) 134/52 128/64 137/74 135/78  Pulse: (!) 101 79 98 85  Resp: 18 16 18 20   Temp: 97.8 F (36.6 C) 97.9 F (36.6 C) 98 F (36.7 C) 98 F (36.7 C)  TempSrc: Oral Oral Oral Oral  SpO2: 98% 100% 97% 97%  Weight:      Height:        Intake/Output Summary (Last 24 hours) at 07/09/2020 0939 Last data filed at 07/09/2020 0700 Gross per 24 hour  Intake 100 ml  Output 1925 ml  Net -1825 ml   Filed Weights   07/07/20 0112  Weight: 61.2 kg    Examination:  General appearance: Awake alert.  In no distress Resp: Clear to auscultation bilaterally.  Normal effort Cardio: S1-S2 is normal regular.  No S3-S4.  No rubs murmurs or bruit GI: Abdomen is soft.  Nontender nondistended.  Bowel sounds are present normal.  No masses organomegaly Extremities: Mild edema bilateral lower extremities Neurologic: History of MS and unable to move her lower extremity as a result.  No other deficits noted.      Data Reviewed: I have personally reviewed  following labs and imaging studies  CBC: Recent Labs  Lab 07/04/20 0000 07/07/20 0102 07/07/20 0605 07/08/20 0915 07/09/20 0410  WBC 17.6 14.6* 18.1* 12.3* 11.4*  NEUTROABS  --  11.6* 14.3* 9.8*  --   HGB 9.5* 7.8* 8.9* 8.1* 8.0*  HCT 29* 26.4* 29.5* 27.5* 26.3*  MCV  --  85.7 85.3 84.9 85.4  PLT 555* 465* 630* 517* 483*    Basic Metabolic Panel: Recent Labs  Lab 07/07/20 0102 07/07/20 0605 07/08/20 0915 07/09/20 0410  NA 139 141 140 141  K 4.0 3.6 3.2* 3.3*  CL 105 105 109 108  CO2 26 26 28 24   GLUCOSE 88 73 80 70  BUN 18 15 10 10   CREATININE 0.62 0.58 0.50 0.48  CALCIUM 7.8* 8.1* 7.9* 7.9*  MG  --   --  1.7  --   PHOS  --   --  2.4*  --     GFR: Estimated Creatinine Clearance: 63.9 mL/min (by C-G formula based on SCr of 0.48 mg/dL).  Liver Function Tests: Recent Labs  Lab 07/07/20 0102 07/07/20 0605 07/08/20 0915 07/09/20 0410  AST 10* 13* 12* 11*  ALT 7 7 7 8   ALKPHOS 75 84 71 68  BILITOT 0.5 0.5 0.2* 0.4  PROT 4.5* 5.1* 4.7* 4.6*  ALBUMIN 1.6* 1.8* 1.6* 1.7*     Recent Results (from the past 240 hour(s))  Blood culture (routine single)     Status: None (Preliminary result)   Collection Time: 07/07/20  1:02 AM   Specimen: BLOOD  Result Value Ref Range Status   Specimen Description   Final    BLOOD RIGHT WRIST Performed at [facility] [address], [place], [place] [address]    Special Requests   Final    BOTTLES DRAWN AEROBIC AND ANAEROBIC Blood Culture adequate volume Performed at [facility] [address], [place], [place] [address]    Culture   Final    NO GROWTH 1 DAY Performed at [facility], [place] [address] [place], [place] [address]    Report Status PENDING  Incomplete  Urine culture     Status: Abnormal (Preliminary result)   Collection Time: 07/07/20  1:02 AM   Specimen: In/Out Cath Urine  Result  Value Ref Range Status   Specimen Description   Final    IN/OUT CATH  URINE Performed at [facility] [address] [place], [place] [address]    Special Requests   Final    NONE Performed at [facility], [place] [address] [place], [place] [address]    Culture (A)  Final    >=100,000 COLONIES/mL PSEUDOMONAS AERUGINOSA SUSCEPTIBILITIES TO FOLLOW CULTURE REINCUBATED FOR BETTER GROWTH Performed at [facility], [place] [address], [place], [place] [address]    Report Status PENDING  Incomplete  Resp Panel by RT-PCR (Flu A&B, Covid) Nasopharyngeal Swab     Status: None   Collection Time: 07/07/20  5:17 AM   Specimen: Nasopharyngeal Swab; Nasopharyngeal(NP) swabs in vial transport medium  Result Value Ref Range Status   SARS Coronavirus 2 by RT PCR NEGATIVE NEGATIVE Final    Comment: (NOTE) SARS-CoV-2 target nucleic acids are NOT DETECTED.  The SARS-CoV-2 RNA is generally detectable in upper respiratory specimens during the acute phase of infection. The lowest concentration of SARS-CoV-2 viral copies this assay can detect is 138 copies/mL. A negative result does not preclude SARS-Cov-2 infection and should not be used as the sole basis for treatment or other patient management decisions. A negative result may occur with  improper specimen collection/handling, submission of specimen other than nasopharyngeal swab, presence of viral mutation(s) within the areas targeted by this assay, and inadequate number of viral copies(<138 copies/mL). A negative result must be combined with clinical observations, patient history, and epidemiological information. The expected result is Negative.  Fact Sheet for Patients:  EntrepreneurPulse.com.au  Fact Sheet for Healthcare Providers:  IncredibleEmployment.be  This test is no t yet approved or cleared by the [place] FDA and  has been authorized for detection and/or diagnosis of SARS-CoV-2 by FDA under an Emergency Use Authorization  (EUA). This EUA will remain  in effect (meaning this test can be used) for the duration of the COVID-19 declaration under Section 564(b)(1) of the Act, 21 U.S.C.section 360bbb-3(b)(1), unless the authorization is terminated  or revoked sooner.       Influenza A by PCR NEGATIVE NEGATIVE Final   Influenza B by PCR NEGATIVE NEGATIVE Final    Comment: (NOTE) The Xpert Xpress SARS-CoV-2/FLU/RSV plus assay is intended as an aid in the diagnosis of influenza from Nasopharyngeal swab specimens and should not be used as a sole basis for treatment. Nasal washings and aspirates are unacceptable for Xpert Xpress SARS-CoV-2/FLU/RSV testing.  Fact Sheet for Patients: EntrepreneurPulse.com.au  Fact Sheet for Healthcare Providers: IncredibleEmployment.be  This test is not yet approved or cleared by the [place] FDA and has been authorized for detection and/or diagnosis of SARS-CoV-2 by FDA under an Emergency Use Authorization (EUA). This EUA will remain in effect (meaning this test can be used) for the duration of the COVID-19 declaration under Section 564(b)(1) of the Act, 21 U.S.C. section 360bbb-3(b)(1), unless the authorization is terminated or revoked.  Performed at [facility], [place] [address], [place], [place] [address]          Radiology Studies: CT ABDOMEN PELVIS W CONTRAST  Result Date: 07/08/2020 CLINICAL DATA:  Abdominal abscess/infection suspected. Sacral wound infection. Back pain. EXAM: CT ABDOMEN AND PELVIS WITH CONTRAST TECHNIQUE: Multidetector CT imaging of the abdomen and pelvis was performed using the standard protocol following bolus administration of intravenous contrast. CONTRAST:  132mL OMNIPAQUE IOHEXOL 300 MG/ML  SOLN COMPARISON:  CT dated 10/03/2019 FINDINGS: Lower chest:  There are trace bilateral pleural effusions, left greater than right. There is bibasilar atelectasis, left worse than right.The heart  size is normal. Hepatobiliary: The liver is normal. Normal gallbladder.There is no biliary ductal dilation. Pancreas: Normal contours without ductal dilatation. No peripancreatic fluid collection. Spleen: Unremarkable. Adrenals/Urinary Tract: --Adrenal glands: Unremarkable. --Right kidney/ureter: There is severe right-sided hydroureteronephrosis to the level of the urinary bladder. --Left kidney/ureter: There is severe left-sided hydronephrosis to the level of the urinary bladder. There are areas of cortical thinning and scarring. The left-sided collecting system is partially duplicated. --Urinary bladder: The urinary bladder is trabeculated with multiple bladder diverticula. There is diffuse bladder wall thickening with adjacent fat stranding. Stomach/Bowel: --Stomach/Duodenum: No hiatal hernia or other gastric abnormality. Normal duodenal course and caliber. --Small bowel: Unremarkable. --Colon: There is a large amount of stool throughout the colon. --Appendix: Normal. Vascular/Lymphatic: Atherosclerotic calcification is present within the non-aneurysmal abdominal aorta, without hemodynamically significant stenosis. There is a questionable filling defect within the right common femoral vein (axial series 2, image 75). --No retroperitoneal lymphadenopathy. --No mesenteric lymphadenopathy. --No pelvic or inguinal lymphadenopathy. Reproductive: Unremarkable Other: There is mild body wall edema. There are few pockets of subcutaneous gas on the patient's low anterior left abdomen favored to represent sequela of a prior subcutaneous injection. Musculoskeletal. 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 There is presacral edema which is likely reactive. These findings are new since the patient's CT dated 10/03/2019. IMPRESSION: 1. 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  These findings are new since the patient's CT dated 10/03/2019. 2. There is a questionable filling defect within the right common femoral vein, concerning for DVT. Recommend further evaluation with a dedicated right lower extremity venous Doppler ultrasound. 3. Severe bilateral hydroureteronephrosis to the level of the urinary bladder. This is likely secondary to chronic bladder outlet obstruction. 4. Diffuse bladder wall thickening with adjacent fat stranding is concerning for cystitis. Correlation with urinalysis is recommended. 5. Trace bilateral pleural effusions, left greater than right, with bibasilar atelectasis, left worse than right. 6. Large amount of stool throughout the colon. Aortic Atherosclerosis (ICD10-I70.0). Electronically Signed   By: Constance Holster M.D.   On: 07/08/2020 22:47   VAS [place] LOWER EXTREMITY VENOUS (DVT)  Result Date: 07/09/2020  Lower Venous DVT Study Indications: Swelling.  Risk Factors: Immobility. Limitations: Poor ultrasound/tissue interface and patient positioning, patient involuntary movement. Comparison Study: No prior studies. Performing Technologist: Oliver Hum RVT  Examination Guidelines: A complete evaluation includes B-mode imaging, spectral Doppler, color Doppler, and power Doppler as needed of all accessible portions of each vessel. Bilateral testing is considered an integral part of a complete examination. Limited examinations for reoccurring indications may be performed as noted. The reflux portion of the exam is performed with the patient in reverse Trendelenburg.  +---------+---------------+---------+-----------+----------+--------------+ RIGHT    CompressibilityPhasicitySpontaneityPropertiesThrombus Aging +---------+---------------+---------+-----------+----------+--------------+ CFV      Full           Yes      Yes                                 +---------+---------------+---------+-----------+----------+--------------+ SFJ      Full                                                         +---------+---------------+---------+-----------+----------+--------------+  FV Prox  Full                                                        +---------+---------------+---------+-----------+----------+--------------+ FV Mid   Full                                                        +---------+---------------+---------+-----------+----------+--------------+ FV DistalFull                                                        +---------+---------------+---------+-----------+----------+--------------+ PFV      Full                                                        +---------+---------------+---------+-----------+----------+--------------+ POP      Full           Yes      Yes                                 +---------+---------------+---------+-----------+----------+--------------+ PTV      Full                                                        +---------+---------------+---------+-----------+----------+--------------+ PERO     Full                                                        +---------+---------------+---------+-----------+----------+--------------+   +---------+---------------+---------+-----------+----------+--------------+ LEFT     CompressibilityPhasicitySpontaneityPropertiesThrombus Aging +---------+---------------+---------+-----------+----------+--------------+ CFV      Full           Yes      Yes                                 +---------+---------------+---------+-----------+----------+--------------+ SFJ      Full                                                        +---------+---------------+---------+-----------+----------+--------------+ FV Prox  Full                                                        +---------+---------------+---------+-----------+----------+--------------+  FV Mid   Full                                                         +---------+---------------+---------+-----------+----------+--------------+ FV DistalFull                                                        +---------+---------------+---------+-----------+----------+--------------+ PFV      Full                                                        +---------+---------------+---------+-----------+----------+--------------+ POP      Full           Yes      Yes                                 +---------+---------------+---------+-----------+----------+--------------+ PTV      Full                                                        +---------+---------------+---------+-----------+----------+--------------+ PERO     Full                                                        +---------+---------------+---------+-----------+----------+--------------+     Summary: RIGHT: - There is no evidence of deep vein thrombosis in the lower extremity. However, portions of this examination were limited- see technologist comments above.  - No cystic structure found in the popliteal fossa.  LEFT: - There is no evidence of deep vein thrombosis in the lower extremity. However, portions of this examination were limited- see technologist comments above.  - No cystic structure found in the popliteal fossa.  *See table(s) above for measurements and observations.    Preliminary         Scheduled Meds: . vitamin C  500 mg Oral BID  . baclofen  20 mg Oral TID with meals  . baclofen  30 mg Oral QHS  . Chlorhexidine Gluconate Cloth  6 each Topical Daily  . cholecalciferol  6,000 Units Oral Daily  . dicyclomine  20 mg Oral TID AC  . enoxaparin (LOVENOX) injection  40 mg Subcutaneous Q24H  . feeding supplement (PRO-STAT SUGAR FREE 64)  30 mL Oral BID  . ferrous sulfate  325 mg Oral Q breakfast  . folic acid  1 mg Oral Daily  . loratadine  10 mg Oral Daily  . metroNIDAZOLE  500 mg Oral Q8H  . mirtazapine  7.5 mg Oral QHS  . potassium  chloride  10 mEq Oral Daily  . pravastatin  40 mg Oral Daily  .  tamsulosin  0.4 mg Oral Daily  . tiZANidine  4 mg Oral QHS  . vancomycin  125 mg Oral QID   Followed by  . [START ON 07/21/2020] vancomycin  125 mg Oral BID   Followed by  . [START ON 07/28/2020] vancomycin  125 mg Oral Daily   Followed by  . [START ON 08/04/2020] vancomycin  125 mg Oral QODAY   Followed by  . [START ON 08/12/2020] vancomycin  125 mg Oral Q3 days  . vitamin B-12  1,000 mcg Oral Daily   Continuous Infusions: . cefTRIAXone (ROCEPHIN)  IV 2 g (07/08/20 2204)  . vancomycin 1,250 mg (07/09/20 0934)     LOS: 2 days     Bonnielee Haff, MD [facility]   To contact the attending provider between 7A-7P or the covering provider during after hours 7P-7A, please log into the web site [email or web address] and access using universal [place] password for that web site. If you do not have the password, please call the hospital operator.  07/09/2020, 9:39 AM

## 2020-07-09 NOTE — Consult Note (Addendum)
Renee Huang  Renee Huang [birth year]  [id number].    Requesting MD: Bonnielee Haff Chief Complaint/Reason for Consult: sacral wound  HPI:  Renee Huang is a 64yo female h/o progressive multiple sclerosis, bedbound who presented to [facility] from her SNF on 11/20 with altered mental status. 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 UA is concerning for UTI. She was started on IV ceftriaxone and admitted to the medical service. Noted to have a sacral wound. CT scan abdomen/pelvis consistent with large sacral decubitus ulcer with underlying osteomyelitis. Antibiotics broadened with flagyl, and general surgery and ID asked to see. Patient is unsure how long this wound has been there. States that is is sore, worse with she lies on her back. States that she has never required surgery or any procedure for this. She thinks that she has been undergoing dressing changes for about 2 weeks at SNF.  Review of Systems  Constitutional: Positive for malaise/fatigue. Negative for fever.  Respiratory: Negative.   Cardiovascular: Negative.   Musculoskeletal: Positive for back pain and joint pain.  Skin:       Sacral wound  Neurological: Positive for weakness.    All systems reviewed and otherwise negative except for as above  Family History  Problem Relation Age of Onset  . Hypertension Mother   . Hyperlipidemia Mother   . Breast cancer Mother   . Hypertension Father   . Heart attack Father   . Hypertension Brother   . Diabetes Brother   . Lung cancer Maternal Grandmother   . Heart attack Paternal Grandfather     Past Medical History:  Diagnosis Date  . Actinic keratosis   . BCC (basal cell carcinoma of skin)   . Closed fracture of second metatarsal bone of right foot   . Closed fracture of third metatarsal bone of right foot with routine healing   . IBS (irritable bowel syndrome)   . Multiple sclerosis ([place])   . Neurogenic  bladder   . Rectal polyp    Tubular adenoma  . Recurrent UTI   . SCC (squamous cell carcinoma)     Past Surgical History:  Procedure Laterality Date  . BOTOX INJECTION    . BREAST BIOPSY    . COLONOSCOPY    . SKIN CANCER EXCISION      Social History:  reports that she has never smoked. She has never used smokeless tobacco. She reports current alcohol use of about 18.0 standard drinks of alcohol per week. She reports that she does not use drugs.  Allergies: No Known Allergies  Medications Prior to Admission  Medication Sig Dispense Refill  . acetaminophen (TYLENOL) 325 MG tablet Take 325 mg by mouth every 4 (four) hours as needed for mild pain, fever or headache.     . Amino Acids-Protein Hydrolys (FEEDING SUPPLEMENT, PRO-STAT SUGAR FREE 64,) LIQD Take 30 mLs by mouth in the morning and at bedtime.    Marland Kitchen ampicillin-sulbactam (UNASYN) IVPB Inject 3 g into the vein every 6 (six) hours. Every 6 hours for 4 weeks. STARTED 07/05/2020    . Ascorbic Acid (VITAMIN C PO) Take 500 mg by mouth 2 (two) times daily.    . baclofen (LIORESAL) 10 MG tablet Take 30 mg by mouth at bedtime. Take 3 tablets to = 30 mg    . baclofen (LIORESAL) 20 MG tablet Take 20 mg by mouth 3 (three) times daily.    . cetirizine (ZYRTEC) 5 MG tablet Take 1  tablet (5 mg total) by mouth at bedtime. 30 tablet 11  . Cholecalciferol 25 MCG (1000 UT) tablet Take 1,000 Units by mouth daily. Take along with a 5000 unit tablet to = 6000 units    . dicyclomine (BENTYL) 20 MG tablet Take 20 mg by mouth in the morning, at noon, in the evening, and at bedtime.     Marland Kitchen FLUoxetine (PROZAC) 40 MG capsule Take 1 capsule (40 mg total) by mouth daily. 90 capsule 3  . HYDROcodone-acetaminophen (NORCO/VICODIN) 5-325 MG tablet Take 1 tablet by mouth every 4 (four) hours. While awake 180 tablet 0  . hydrocortisone cream 1 % Apply 1 application topically every 12 (twelve) hours as needed for itching.     . Lactobacillus Rhamnosus, GG, (CULTURELLE)  CAPS Take by mouth. 10B Cell Capsule    . LORazepam (ATIVAN) 0.5 MG tablet Take 0.5 mg by mouth daily as needed (Panic attack).    . Menthol, Topical Analgesic, (BIOFREEZE EX) Apply 5 % topically in the morning, at noon, in the evening, and at bedtime. Apply to Left Shoulder.    . mirtazapine (REMERON) 7.5 MG tablet Take 7.5 mg by mouth at bedtime.    . NON FORMULARY Magic Cup with L/D meals d/t weight loss.    . Nutritional Supplements (ENSURE CLEAR PO) Take by mouth in the morning and at bedtime. D/t weight loss and wound healing    . Nutritional Supplements (JUVEN PO) Take 1 each by mouth in the morning and at bedtime.    . ondansetron (ZOFRAN) 4 MG tablet Take 4 mg by mouth in the morning, at noon, and at bedtime. Nausea and Vomiting    . potassium chloride (KLOR-CON) 10 MEQ tablet Take 10 mEq by mouth daily.     . pravastatin (PRAVACHOL) 40 MG tablet Take 40 mg by mouth daily.    . tamsulosin (FLOMAX) 0.4 MG CAPS capsule Take 0.4 mg by mouth daily.    . vitamin B-12 (CYANOCOBALAMIN) 1000 MCG tablet Take 1,000 mcg by mouth daily.    . Vitamin D, Cholecalciferol, 25 MCG (1000 UT) TABS Take 5,000 Units by mouth daily. Along with a 1000 unit to = 6000 units    . tiZANidine (ZANAFLEX) 2 MG tablet Take 4 mg by mouth at bedtime.       Prior to Admission medications   Medication Sig Start Date End Date Taking? Authorizing Provider  acetaminophen (TYLENOL) 325 MG tablet Take 325 mg by mouth every 4 (four) hours as needed for mild pain, fever or headache.    Yes [provider]  Amino Acids-Protein Hydrolys (FEEDING SUPPLEMENT, PRO-STAT SUGAR FREE 64,) LIQD Take 30 mLs by mouth in the morning and at bedtime.   Yes [provider]  ampicillin-sulbactam (UNASYN) IVPB Inject 3 g into the vein every 6 (six) hours. Every 6 hours for 4 weeks. STARTED 07/05/2020   Yes [provider]  Ascorbic Acid (VITAMIN C PO) Take 500 mg by mouth 2 (two) times daily.   Yes [provider]  baclofen (LIORESAL) 10 MG tablet Take 30 mg by mouth at bedtime. Take 3 tablets to = 30 mg   Yes [provider]  baclofen (LIORESAL) 20 MG tablet Take 20 mg by mouth 3 (three) times daily. 01/24/20  Yes [provider]  cetirizine (ZYRTEC) 5 MG tablet Take 1 tablet (5 mg total) by mouth at bedtime. 03/23/20  Yes Reed, Tiffany L, DO  Cholecalciferol 25 MCG (1000 UT) tablet Take 1,000 Units  by mouth daily. Take along with a 5000 unit tablet to = 6000 units   Yes [provider]  dicyclomine (BENTYL) 20 MG tablet Take 20 mg by mouth in the morning, at noon, in the evening, and at bedtime.    Yes [provider]  FLUoxetine (PROZAC) 40 MG capsule Take 1 capsule (40 mg total) by mouth daily. 03/23/20  Yes Reed, Tiffany L, DO  HYDROcodone-acetaminophen (NORCO/VICODIN) 5-325 MG tablet Take 1 tablet by mouth every 4 (four) hours. While awake 07/03/20  Yes Reed, Tiffany L, DO  hydrocortisone cream 1 % Apply 1 application topically every 12 (twelve) hours as needed for itching.    Yes [provider]  Lactobacillus Rhamnosus, GG, (CULTURELLE) CAPS Take by mouth. 10B Cell Capsule   Yes [provider]  LORazepam (ATIVAN) 0.5 MG tablet Take 0.5 mg by mouth daily as needed (Panic attack).   Yes [provider]  Menthol, Topical Analgesic, (BIOFREEZE EX) Apply 5 % topically in the morning, at noon, in the evening, and at bedtime. Apply to Left Shoulder.   Yes [provider]  mirtazapine (REMERON) 7.5 MG tablet Take 7.5 mg by mouth at bedtime. 07/03/20  Yes [provider]  NON FORMULARY Magic Cup with L/D meals d/t weight loss. 07/02/20  Yes [provider]  Nutritional Supplements (ENSURE CLEAR PO) Take by mouth in the morning and at bedtime. D/t weight loss and wound healing 07/02/20  Yes [provider]  Nutritional Supplements (JUVEN PO) Take 1 each by mouth in the morning and at bedtime.   Yes  [provider]  ondansetron (ZOFRAN) 4 MG tablet Take 4 mg by mouth in the morning, at noon, and at bedtime. Nausea and Vomiting 07/06/20  Yes [provider]  potassium chloride (KLOR-CON) 10 MEQ tablet Take 10 mEq by mouth daily.  02/08/20  Yes [provider]  pravastatin (PRAVACHOL) 40 MG tablet Take 40 mg by mouth daily.   Yes [provider]  tamsulosin (FLOMAX) 0.4 MG CAPS capsule Take 0.4 mg by mouth daily.   Yes [provider]  vitamin B-12 (CYANOCOBALAMIN) 1000 MCG tablet Take 1,000 mcg by mouth daily.   Yes [provider]  Vitamin D, Cholecalciferol, 25 MCG (1000 UT) TABS Take 5,000 Units by mouth daily. Along with a 1000 unit to = 6000 units 01/23/20  Yes [provider]  tiZANidine (ZANAFLEX) 2 MG tablet Take 4 mg by mouth at bedtime.  06/28/20   [provider]    Blood pressure 135/78, pulse 85, temperature 98 F (36.7 C), temperature source Oral, resp. rate 20, height 5\' 5"  (1.651 m), weight 61.2 kg, SpO2 97 %. Physical Exam: General: pleasant, chronically ill appearing female who is laying in bed in NAD HEENT: head is normocephalic, atraumatic.  Sclera are noninjected.  PERRL.  Ears and nose without any masses or lesions.  Mouth is pink and moist. Dentition fair Heart: regular, rate, and rhythm Lungs: no audible wheezes, rhonchi, or rales noted.  Respiratory effort nonlabored on room air Abd: soft, NT/ND, +BS, no masses, hernias, or organomegaly MS: no BUE/BLE edema, calves soft and nontender Skin: warm and dry with no masses, lesions, or rashes Psych: A&Ox3 with an appropriate affect Neuro: cranial nerves grossly intact, symmetric LE>UE weakness bilaterally, normal speech, thought process intact GU: sacral wound very clean with majority healthy beefy red tissue, some fibrinous tissue, no purulent drainage noted, bone is palpable.      Results for orders placed  or performed during the hospital  encounter of 07/07/20 (from the past 48 hour(s))  Urine rapid drug screen (hosp performed)     Status: Abnormal   Collection Time: 07/07/20 11:34 AM  Result Value Ref Range   Opiates POSITIVE (A) NONE DETECTED   Cocaine NONE DETECTED NONE DETECTED   Benzodiazepines NONE DETECTED NONE DETECTED   Amphetamines NONE DETECTED NONE DETECTED   Tetrahydrocannabinol NONE DETECTED NONE DETECTED   Barbiturates NONE DETECTED NONE DETECTED    Comment: (Huang) DRUG SCREEN FOR MEDICAL PURPOSES ONLY.  IF CONFIRMATION IS NEEDED FOR ANY PURPOSE, NOTIFY LAB WITHIN 5 DAYS.  LOWEST DETECTABLE LIMITS FOR URINE DRUG SCREEN Drug Class                     Cutoff (ng/mL) Amphetamine and metabolites    1000 Barbiturate and metabolites    200 Benzodiazepine                 024 Tricyclics and metabolites     300 Opiates and metabolites        300 Cocaine and metabolites        300 THC                            50 Performed at [facility], [place] [address], [place], [place] [address]   CBC with Differential/Platelet     Status: Abnormal   Collection Time: 07/08/20  9:15 AM  Result Value Ref Range   WBC 12.3 (H) 4.0 - 10.5 K/uL   RBC 3.24 (L) 3.87 - 5.11 MIL/uL   Hemoglobin 8.1 (L) 12.0 - 15.0 g/dL   HCT 27.5 (L) 36 - 46 %   MCV 84.9 80.0 - 100.0 fL   MCH 25.0 (L) 26.0 - 34.0 pg   MCHC 29.5 (L) 30.0 - 36.0 g/dL   RDW 17.9 (H) 11.5 - 15.5 %   Platelets 517 (H) 150 - 400 K/uL   nRBC 0.0 0.0 - 0.2 %   Neutrophils Relative % 80 %   Neutro Abs 9.8 (H) 1.7 - 7.7 K/uL   Lymphocytes Relative 11 %   Lymphs Abs 1.4 0.7 - 4.0 K/uL   Monocytes Relative 6 %   Monocytes Absolute 0.7 0.1 - 1.0 K/uL   Eosinophils Relative 2 %   Eosinophils Absolute 0.3 0.0 - 0.5 K/uL   Basophils Relative 0 %   Basophils Absolute 0.0 0.0 - 0.1 K/uL   Immature Granulocytes 1 %   Abs Immature Granulocytes 0.10 (H) 0.00 - 0.07 K/uL    Comment: Performed at [facility], [place] [address],  [place], [place] [address]  Comprehensive metabolic panel     Status: Abnormal   Collection Time: 07/08/20  9:15 AM  Result Value Ref Range   Sodium 140 135 - 145 mmol/L   Potassium 3.2 (L) 3.5 - 5.1 mmol/L   Chloride 109 98 - 111 mmol/L   CO2 28 22 - 32 mmol/L   Glucose, Bld 80 70 - 99 mg/dL    Comment: Glucose reference range applies only to samples taken after fasting for at least 8 hours.   BUN 10 8 - 23 mg/dL   Creatinine, Ser 0.50 0.44 - 1.00 mg/dL   Calcium 7.9 (L) 8.9 - 10.3 mg/dL   Total Protein 4.7 (L) 6.5 - 8.1 g/dL   Albumin 1.6 (L) 3.5 - 5.0 g/dL   AST 12 (L) 15 -  41 U/L   ALT 7 0 - 44 U/L   Alkaline Phosphatase 71 38 - 126 U/L   Total Bilirubin 0.2 (L) 0.3 - 1.2 mg/dL   GFR, Estimated >60 >60 mL/min    Comment: (Huang) Calculated using the CKD-EPI Creatinine Equation (2021)    Anion gap 3 (L) 5 - 15    Comment: Performed at [facility], [place] [address] [place], [place] [address]  Magnesium     Status: None   Collection Time: 07/08/20  9:15 AM  Result Value Ref Range   Magnesium 1.7 1.7 - 2.4 mg/dL    Comment: Performed at [facility], [place] [address], [place], [place] [address]  Phosphorus     Status: Abnormal   Collection Time: 07/08/20  9:15 AM  Result Value Ref Range   Phosphorus 2.4 (L) 2.5 - 4.6 mg/dL    Comment: Performed at [facility], [place] [address], [place], [place] [address]  C-reactive protein     Status: Abnormal   Collection Time: 07/08/20  9:15 AM  Result Value Ref Range   CRP 10.7 (H) <1.0 mg/dL    Comment: Performed at [facility], [place] [address], [place], [place] [address]  Sedimentation rate     Status: Abnormal   Collection Time: 07/08/20  9:15 AM  Result Value Ref Range   Sed Rate 65 (H) 0 - 22 mm/hr    Comment: Performed at [facility], [place] [address] [place],  [address]  Procalcitonin     Status: None   Collection Time: 07/08/20  9:15  AM  Result Value Ref Range   Procalcitonin 0.22 ng/mL    Comment:        Interpretation: PCT (Procalcitonin) <= 0.5 ng/mL: Systemic infection (sepsis) is not likely. Local bacterial infection is possible. (Huang)       Sepsis PCT Algorithm           Lower Respiratory Tract                                      Infection PCT Algorithm    ----------------------------     ----------------------------         PCT < 0.25 ng/mL                PCT < 0.10 ng/mL          Strongly encourage             Strongly discourage   discontinuation of antibiotics    initiation of antibiotics    ----------------------------     -----------------------------       PCT 0.25 - 0.50 ng/mL            PCT 0.10 - 0.25 ng/mL               OR       >80% decrease in PCT            Discourage initiation of                                            antibiotics      Encourage discontinuation           of antibiotics    ----------------------------     -----------------------------  PCT >= 0.50 ng/mL              PCT 0.26 - 0.50 ng/mL               AND        <80% decrease in PCT             Encourage initiation of                                             antibiotics       Encourage continuation           of antibiotics    ----------------------------     -----------------------------        PCT >= 0.50 ng/mL                  PCT > 0.50 ng/mL               AND         increase in PCT                  Strongly encourage                                      initiation of antibiotics    Strongly encourage escalation           of antibiotics                                     -----------------------------                                           PCT <= 0.25 ng/mL                                                 OR                                        > 80% decrease in PCT                                      Discontinue / Do not initiate                                              antibiotics  Performed at [place] [address] [place], [place] [address]   Procalcitonin     Status: None   Collection Time: 07/09/20  4:10 AM  Result Value Ref Range   Procalcitonin 0.27 ng/mL    Comment:        Interpretation: PCT (Procalcitonin) <= 0.5 ng/mL: Systemic infection (sepsis) is not likely.  Local bacterial infection is possible. (Huang)       Sepsis PCT Algorithm           Lower Respiratory Tract                                      Infection PCT Algorithm    ----------------------------     ----------------------------         PCT < 0.25 ng/mL                PCT < 0.10 ng/mL          Strongly encourage             Strongly discourage   discontinuation of antibiotics    initiation of antibiotics    ----------------------------     -----------------------------       PCT 0.25 - 0.50 ng/mL            PCT 0.10 - 0.25 ng/mL               OR       >80% decrease in PCT            Discourage initiation of                                            antibiotics      Encourage discontinuation           of antibiotics    ----------------------------     -----------------------------         PCT >= 0.50 ng/mL              PCT 0.26 - 0.50 ng/mL               AND        <80% decrease in PCT             Encourage initiation of                                             antibiotics       Encourage continuation           of antibiotics    ----------------------------     -----------------------------        PCT >= 0.50 ng/mL                  PCT > 0.50 ng/mL               AND         increase in PCT                  Strongly encourage                                      initiation of antibiotics    Strongly encourage escalation           of antibiotics                                     -----------------------------  PCT <= 0.25 ng/mL                                                 OR                                         > 80% decrease in PCT                                      Discontinue / Do not initiate                                             antibiotics  Performed at [place] [address], [place], [place] [address]   CBC     Status: Abnormal   Collection Time: 07/09/20  4:10 AM  Result Value Ref Range   WBC 11.4 (H) 4.0 - 10.5 K/uL   RBC 3.08 (L) 3.87 - 5.11 MIL/uL   Hemoglobin 8.0 (L) 12.0 - 15.0 g/dL   HCT 26.3 (L) 36 - 46 %   MCV 85.4 80.0 - 100.0 fL   MCH 26.0 26.0 - 34.0 pg   MCHC 30.4 30.0 - 36.0 g/dL   RDW 18.0 (H) 11.5 - 15.5 %   Platelets 483 (H) 150 - 400 K/uL   nRBC 0.0 0.0 - 0.2 %    Comment: Performed at [facility], [place] [address], [place], [place] [address]  Comprehensive metabolic panel     Status: Abnormal   Collection Time: 07/09/20  4:10 AM  Result Value Ref Range   Sodium 141 135 - 145 mmol/L   Potassium 3.3 (L) 3.5 - 5.1 mmol/L   Chloride 108 98 - 111 mmol/L   CO2 24 22 - 32 mmol/L   Glucose, Bld 70 70 - 99 mg/dL    Comment: Glucose reference range applies only to samples taken after fasting for at least 8 hours.   BUN 10 8 - 23 mg/dL   Creatinine, Ser 0.48 0.44 - 1.00 mg/dL   Calcium 7.9 (L) 8.9 - 10.3 mg/dL   Total Protein 4.6 (L) 6.5 - 8.1 g/dL   Albumin 1.7 (L) 3.5 - 5.0 g/dL   AST 11 (L) 15 - 41 U/L   ALT 8 0 - 44 U/L   Alkaline Phosphatase 68 38 - 126 U/L   Total Bilirubin 0.4 0.3 - 1.2 mg/dL   GFR, Estimated >60 >60 mL/min    Comment: (Huang) Calculated using the CKD-EPI Creatinine Equation (2021)    Anion gap 9 5 - 15    Comment: Performed at [facility], [place] [address] [place], [place] [address]   CT ABDOMEN PELVIS W CONTRAST  Result Date: 07/08/2020 CLINICAL DATA:  Abdominal abscess/infection suspected. Sacral wound infection. Back pain. EXAM: CT ABDOMEN AND PELVIS WITH CONTRAST TECHNIQUE: Multidetector CT imaging of the abdomen and pelvis was performed  using the standard protocol following bolus administration of intravenous contrast. CONTRAST:  166mL OMNIPAQUE IOHEXOL 300 MG/ML  SOLN COMPARISON:  CT dated 10/03/2019 FINDINGS: Lower  chest: There are trace bilateral pleural effusions, left greater than right. There is bibasilar atelectasis, left worse than right.The heart size is normal. Hepatobiliary: The liver is normal. Normal gallbladder.There is no biliary ductal dilation. Pancreas: Normal contours without ductal dilatation. No peripancreatic fluid collection. Spleen: Unremarkable. Adrenals/Urinary Tract: --Adrenal glands: Unremarkable. --Right kidney/ureter: There is severe right-sided hydroureteronephrosis to the level of the urinary bladder. --Left kidney/ureter: There is severe left-sided hydronephrosis to the level of the urinary bladder. There are areas of cortical thinning and scarring. The left-sided collecting system is partially duplicated. --Urinary bladder: The urinary bladder is trabeculated with multiple bladder diverticula. There is diffuse bladder wall thickening with adjacent fat stranding. Stomach/Bowel: --Stomach/Duodenum: No hiatal hernia or other gastric abnormality. Normal duodenal course and caliber. --Small bowel: Unremarkable. --Colon: There is a large amount of stool throughout the colon. --Appendix: Normal. Vascular/Lymphatic: Atherosclerotic calcification is present within the non-aneurysmal abdominal aorta, without hemodynamically significant stenosis. There is a questionable filling defect within the right common femoral vein (axial series 2, image 75). --No retroperitoneal lymphadenopathy. --No mesenteric lymphadenopathy. --No pelvic or inguinal lymphadenopathy. Reproductive: Unremarkable Other: There is mild body wall edema. There are few pockets of subcutaneous gas on the patient's low anterior left abdomen favored to represent sequela of a prior subcutaneous injection. Musculoskeletal. There is a large sacral decubitus ulcer  with findings consistent with osteomyelitis involving the underlying coccyx. There is presacral edema which is likely reactive. These findings are new since the patient's CT dated 10/03/2019. IMPRESSION: 1. 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 These findings are new since the patient's CT dated 10/03/2019. 2. There is a questionable filling defect within the right common femoral vein, concerning for DVT. Recommend further evaluation with a dedicated right lower extremity venous Doppler ultrasound. 3. Severe bilateral hydroureteronephrosis to the level of the urinary bladder. This is likely secondary to chronic bladder outlet obstruction. 4. Diffuse bladder wall thickening with adjacent fat stranding is concerning for cystitis. Correlation with urinalysis is recommended. 5. Trace bilateral pleural effusions, left greater than right, with bibasilar atelectasis, left worse than right. 6. Large amount of stool throughout the colon. Aortic Atherosclerosis (ICD10-I70.0). Electronically Signed   By: Constance Holster M.D.   On: 07/08/2020 22:47   VAS [place] LOWER EXTREMITY VENOUS (DVT)  Result Date: 07/09/2020  Lower Venous DVT Study Indications: Swelling.  Risk Factors: Immobility. Limitations: Poor ultrasound/tissue interface and patient positioning, patient involuntary movement. Comparison Study: No prior studies. Performing Technologist: Oliver Hum RVT  Examination Guidelines: A complete evaluation includes B-mode imaging, spectral Doppler, color Doppler, and power Doppler as needed of all accessible portions of each vessel. Bilateral testing is considered an integral part of a complete examination. Limited examinations for reoccurring indications may be performed as noted. The reflux portion of the exam is performed with the patient in reverse Trendelenburg.   +---------+---------------+---------+-----------+----------+--------------+ RIGHT    CompressibilityPhasicitySpontaneityPropertiesThrombus Aging +---------+---------------+---------+-----------+----------+--------------+ CFV      Full           Yes      Yes                                 +---------+---------------+---------+-----------+----------+--------------+ SFJ      Full                                                        +---------+---------------+---------+-----------+----------+--------------+  FV Prox  Full                                                        +---------+---------------+---------+-----------+----------+--------------+ FV Mid   Full                                                        +---------+---------------+---------+-----------+----------+--------------+ FV DistalFull                                                        +---------+---------------+---------+-----------+----------+--------------+ PFV      Full                                                        +---------+---------------+---------+-----------+----------+--------------+ POP      Full           Yes      Yes                                 +---------+---------------+---------+-----------+----------+--------------+ PTV      Full                                                        +---------+---------------+---------+-----------+----------+--------------+ PERO     Full                                                        +---------+---------------+---------+-----------+----------+--------------+   +---------+---------------+---------+-----------+----------+--------------+ LEFT     CompressibilityPhasicitySpontaneityPropertiesThrombus Aging +---------+---------------+---------+-----------+----------+--------------+ CFV      Full           Yes      Yes                                  +---------+---------------+---------+-----------+----------+--------------+ SFJ      Full                                                        +---------+---------------+---------+-----------+----------+--------------+ FV Prox  Full                                                        +---------+---------------+---------+-----------+----------+--------------+  FV Mid   Full                                                        +---------+---------------+---------+-----------+----------+--------------+ FV DistalFull                                                        +---------+---------------+---------+-----------+----------+--------------+ PFV      Full                                                        +---------+---------------+---------+-----------+----------+--------------+ POP      Full           Yes      Yes                                 +---------+---------------+---------+-----------+----------+--------------+ PTV      Full                                                        +---------+---------------+---------+-----------+----------+--------------+ PERO     Full                                                        +---------+---------------+---------+-----------+----------+--------------+     Summary: RIGHT: - There is no evidence of deep vein thrombosis in the lower extremity. However, portions of this examination were limited- see technologist comments above.  - No cystic structure found in the popliteal fossa.  LEFT: - There is no evidence of deep vein thrombosis in the lower extremity. However, portions of this examination were limited- see technologist comments above.  - No cystic structure found in the popliteal fossa.  *See table(s) above for measurements and observations.    Preliminary    Anti-infectives (From admission, onward)   Start     Dose/Rate Route Frequency Ordered Stop   08/12/20 1000  vancomycin (VANCOCIN) 50  mg/mL oral solution 125 mg       "Followed by" Linked Group Details   125 mg Oral Every 3 DAYS 07/07/20 0557 08/21/20 0959   08/04/20 1000  vancomycin (VANCOCIN) 50 mg/mL oral solution 125 mg       "Followed by" Linked Group Details   125 mg Oral Every other day 07/07/20 0557 08/12/20 0959   07/28/20 1000  vancomycin (VANCOCIN) 50 mg/mL oral solution 125 mg       "Followed by" Linked Group Details   125 mg Oral Daily 07/07/20 0557 08/04/20 0959   07/21/20 1000  vancomycin (VANCOCIN) 50 mg/mL oral solution 125 mg       "Followed by" Linked  Group Details   125 mg Oral 2 times daily 07/07/20 0557 07/28/20 0959   07/08/20 0600  cefTRIAXone (ROCEPHIN) 1 g in sodium chloride 0.9 % 100 mL IVPB  Status:  Discontinued        1 g 200 mL/hr over 30 Minutes Intravenous Every 24 hours 07/07/20 0540 07/07/20 2016   07/07/20 2200  metroNIDAZOLE (FLAGYL) tablet 500 mg        500 mg Oral Every 8 hours 07/07/20 1932     07/07/20 2200  cefTRIAXone (ROCEPHIN) 2 g in sodium chloride 0.9 % 100 mL IVPB        2 g 200 mL/hr over 30 Minutes Intravenous Every 24 hours 07/07/20 2016     07/07/20 2100  vancomycin (VANCOREADY) IVPB 1250 mg/250 mL        1,250 mg 166.7 mL/hr over 90 Minutes Intravenous Daily 07/07/20 1947     07/07/20 1000  vancomycin (VANCOCIN) 50 mg/mL oral solution 125 mg       "Followed by" Linked Group Details   125 mg Oral 4 times daily 07/07/20 0557 07/21/20 0959   07/07/20 0245  cefTRIAXone (ROCEPHIN) 1 g in sodium chloride 0.9 % 100 mL IVPB        1 g 200 mL/hr over 30 Minutes Intravenous  Once 07/07/20 0237 07/07/20 0037        Assessment/Plan Multiple sclerosis, bedbound Recurrent c diff colitis  UTI  Stage 4 sacral wound with osteomyelitis - Wound is very clean and has no signs of acute infection necessitating acute surgical debridement. Will add santyl to BID wet to dry dressing changes. Continue air mattress, turn patient frequently. Encourage protein rich diet. Osteomyelitis  per primary/ID. We will sign off, please call with concerns.   ID - rocephin/flagyl 11/20>>, oral vancomycin 11/20>> VTE - SCDs, lovenox FEN - D3 diet, Ensure Foley - none Follow up - wound clinic, no surgical follow up needed  Wellington Hampshire, [facility] Surgery 07/09/2020, 10:02 AM Please see Amion for pager number during day hours 7:00am-4:30pm

## 2020-07-09 NOTE — Progress Notes (Signed)
Speech Language Pathology Treatment: Dysphagia  Patient Details Name: Renee Huang MRN: [id number] DOB: 02-17-1956 Today's Date: 07/09/2020 Time: 6438-3818 SLP Time Calculation (min) (ACUTE ONLY): 25 min  Assessment / Plan / Recommendation Clinical Impression  SLP received notice from RN that pt is unhappy with her current diet and would like diet advanced to regular.  Mentation today is appropriate - Pt was fully alert and engaged.  SLP tested pt with advanced textures including [place] sandwich, peanut butter on graham crackers and with water.  Pt able to self feed with proper positioning.  Adequate mastication and oral clearance noted in timely matter.  No indication of airway compromise or complaint of dysphagia with solids.    SLP conducted 3 ounce [place] water challenged with pt - subtle coughing noted post-swallow.  Further subtle coughing episodes observed with 4/5 boluses - 80%. Pt did not appear in respiratory distress and admits this has been ongoing for a "long time" prior to admission.  Advised her that she may be having some aspiration of thin liquids.  Today no belching noted as observed during prior evaluation 2 days prior and pt denies GERD.     Thus SLP reviewed with pt risk factors for asp pnas including compromised immune system, oral health deficits and dysphagia - pt has one of three risk factors which results in low risk of aspiration pneumonias.  She denies having pneumonia *ever* nor requiring heimilch maneuver for significant dysphagia.    SLP discussed option of pursuing MBS with pt to which she politely declined.  Advised she drink Ensure with meals instead of water if decreases coughing episodes and reviewed pH neutral status of water making it best to consume.    Also recommend she inform her MD if recurrent pulmonary infections occur, or if she experiences worsening dysphagia as an OP MBS could be arranged if needed.  Cough is weak but voice and throat clearing  subjectively appeared strong.  Recommended pt strengthen her cough for airway protection.    No SLP follow up as pt informed to mitigation strategies to decrease potential aspiration, she declines MBS nor RMST at this time.  Discussed alternative communication for hands free calling if voice becomes weak including using a "boom mike".  Showed pt an image of a "boom mike" for family to look up if desired.  SLP finally ordered dinner for pt to allow consumption of advanced textures tonight.  Thanks for this referral.    HPI HPI: 64 y.o. female with history of primary progressive multiple sclerosis presently not on any disease modifying medication was found to be lethargic at the facility and was brought to the ER.  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  Patient had worsening back pain and as per the report was given some hydrocodone following which patient became more lethargic.  Pt was noted to cough in ED with liquids and SLP swallow eval was ordered.  BSE done on 11/20 with recommendation for dys3/thin diet - SLP follow up indicated as per RN, pt is unhappy with her dietary restriction and wants advancement.  Pt reports her mentation has improved significantly.      SLP Plan  All goals met       Recommendations  Diet recommendations: Regular;Thin liquid Liquids provided via: Cup;Straw Medication Administration: Other (Comment) (whole meds with liquids, small sips) Supervision: Staff to assist with self feeding;Patient able to self feed (defer to pt for her feeding needs) Compensations: Slow rate;Small sips/bites Postural  Changes and/or Swallow Maneuvers: Seated upright 90 degrees;Upright 30-60 min after meal                Oral Care Recommendations: Oral care BID Follow up Recommendations: Skilled Nursing facility SLP Visit Diagnosis: Dysphagia, oropharyngeal phase (R13.12) Plan: All goals met       GO                Renee Huang 07/09/2020, 6:18 PM   Renee Lime, MS [facility] SLP Acute Rehab Services Office [phone] Pager [phone]

## 2020-07-10 DIAGNOSIS — Z7189 Other specified counseling: Secondary | ICD-10-CM | POA: Diagnosis not present

## 2020-07-10 DIAGNOSIS — T148XXA Other injury of unspecified body region, initial encounter: Secondary | ICD-10-CM

## 2020-07-10 DIAGNOSIS — M86159 Other acute osteomyelitis, unspecified femur: Secondary | ICD-10-CM

## 2020-07-10 DIAGNOSIS — L89154 Pressure ulcer of sacral region, stage 4: Secondary | ICD-10-CM

## 2020-07-10 DIAGNOSIS — R52 Pain, unspecified: Secondary | ICD-10-CM

## 2020-07-10 DIAGNOSIS — F13921 Sedative, hypnotic or anxiolytic use, unspecified with intoxication delirium: Secondary | ICD-10-CM

## 2020-07-10 DIAGNOSIS — Z515 Encounter for palliative care: Secondary | ICD-10-CM | POA: Diagnosis not present

## 2020-07-10 DIAGNOSIS — G35 Multiple sclerosis: Secondary | ICD-10-CM | POA: Diagnosis not present

## 2020-07-10 DIAGNOSIS — A0471 Enterocolitis due to Clostridium difficile, recurrent: Secondary | ICD-10-CM | POA: Diagnosis not present

## 2020-07-10 DIAGNOSIS — M4628 Osteomyelitis of vertebra, sacral and sacrococcygeal region: Secondary | ICD-10-CM | POA: Diagnosis not present

## 2020-07-10 DIAGNOSIS — G9340 Encephalopathy, unspecified: Secondary | ICD-10-CM | POA: Diagnosis not present

## 2020-07-10 LAB — COMPREHENSIVE METABOLIC PANEL
ALT: 9 U/L (ref 0–44)
AST: 12 U/L — ABNORMAL LOW (ref 15–41)
Albumin: 1.7 g/dL — ABNORMAL LOW (ref 3.5–5.0)
Alkaline Phosphatase: 65 U/L (ref 38–126)
Anion gap: 8 (ref 5–15)
BUN: 10 mg/dL (ref 8–23)
CO2: 24 mmol/L (ref 22–32)
Calcium: 7.8 mg/dL — ABNORMAL LOW (ref 8.9–10.3)
Chloride: 110 mmol/L (ref 98–111)
Creatinine, Ser: 0.66 mg/dL (ref 0.44–1.00)
GFR, Estimated: >60 mL/min (ref >60)
Glucose, Bld: 129 mg/dL — ABNORMAL HIGH (ref 70–99)
Potassium: 3.2 mmol/L — ABNORMAL LOW (ref 3.5–5.1)
Sodium: 142 mmol/L (ref 135–145)
Total Bilirubin: 0.4 mg/dL (ref 0.3–1.2)
Total Protein: 4.6 g/dL — ABNORMAL LOW (ref 6.5–8.1)

## 2020-07-10 LAB — URINE CULTURE: Culture: >=100000 — AB

## 2020-07-10 LAB — CBC
HCT: 26.7 % — ABNORMAL LOW (ref 36.0–46.0)
Hemoglobin: 7.9 g/dL — ABNORMAL LOW (ref 12.0–15.0)
MCH: 25.2 pg — ABNORMAL LOW (ref 26.0–34.0)
MCHC: 29.6 g/dL — ABNORMAL LOW (ref 30.0–36.0)
MCV: 85.3 fL (ref 80.0–100.0)
Platelets: 416 10*3/uL — ABNORMAL HIGH (ref 150–400)
RBC: 3.13 MIL/uL — ABNORMAL LOW (ref 3.87–5.11)
RDW: 18.2 % — ABNORMAL HIGH (ref 11.5–15.5)
WBC: 9.6 10*3/uL (ref 4.0–10.5)
nRBC: 0 % (ref 0.0–0.2)

## 2020-07-10 LAB — MAGNESIUM: Magnesium: 2 mg/dL (ref 1.7–2.4)

## 2020-07-10 MED ORDER — ACETAMINOPHEN 500 MG PO TABS
1000.0000 mg | ORAL_TABLET | Freq: Three times a day (TID) | ORAL | Status: DC
  Administered 2020-07-10 – 2020-07-13 (×8): 1000 mg via ORAL
  Filled 2020-07-10 (×8): qty 2

## 2020-07-10 MED ORDER — HALOPERIDOL LACTATE 5 MG/ML IJ SOLN
0.5000 mg | Freq: Once | INTRAMUSCULAR | Status: AC
  Administered 2020-07-10: 0.5 mg via INTRAVENOUS
  Filled 2020-07-10: qty 1

## 2020-07-10 MED ORDER — BACLOFEN 10 MG PO TABS
10.0000 mg | ORAL_TABLET | Freq: Every day | ORAL | Status: DC
  Administered 2020-07-10 – 2020-07-12 (×3): 10 mg via ORAL
  Filled 2020-07-10 (×5): qty 1

## 2020-07-10 MED ORDER — AMPICILLIN-SULBACTAM IV (FOR PTA / DISCHARGE USE ONLY): 3.0000 g | Freq: Four times a day (QID) | INTRAVENOUS | 0 refills | Status: AC

## 2020-07-10 MED ORDER — POTASSIUM CHLORIDE CRYS ER 20 MEQ PO TBCR
40.0000 meq | EXTENDED_RELEASE_TABLET | Freq: Once | ORAL | Status: AC
  Administered 2020-07-10: 40 meq via ORAL
  Filled 2020-07-10: qty 2

## 2020-07-10 MED ORDER — TRAMADOL HCL 50 MG PO TABS: 25.0000 mg | ORAL_TABLET | Freq: Two times a day (BID) | ORAL | Status: DC | PRN

## 2020-07-10 MED ORDER — VANCOMYCIN 50 MG/ML ORAL SOLUTION
125.0000 mg | Freq: Two times a day (BID) | ORAL | 0 refills | Status: DC
Start: 2020-07-10 — End: 2020-07-16

## 2020-07-10 MED ORDER — TRAMADOL HCL 50 MG PO TABS: 25.0000 mg | ORAL_TABLET | Freq: Three times a day (TID) | ORAL | Status: DC | PRN

## 2020-07-10 NOTE — Evaluation (Signed)
Physical Therapy Evaluation Patient Details Name: Renee Huang MRN: [id number] DOB: 11-08-1955 Today's Date: 07/10/2020   History of Present Illness  Shaima Sardinas is a 64 y.o. female with history of primary progressive multiple sclerosis presently not on any disease modifying medication was found to be lethargic at the facility and was brought to the ER.  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  Patient had worsening back pain and as per the report was given some hydrocodone following which patient became more lethargic.  It was told that patient was hypotensive.  Patient has stage 4 sacral pressure  wound.  Clinical Impression  Patient alert , able to give some history as to motor and mobility at facility. Patient requires total assistance for rolling and sitting on bed edge x ~8 minutes, requiring support, tendency to lean to left, did  Self support with RUE at times. . Patient incontinent of BM, assisted with rolling and getting cleaned up.  Pt admitted with above diagnosis.  Pt currently with functional limitations due to the deficits listed below (see PT Problem List). Pt will benefit from skilled PT to increase their independence and safety with mobility to allow discharge to the venue listed below.       Follow Up Recommendations SNF    Equipment Recommendations  None recommended by PT    Recommendations for Other Services       Precautions / Restrictions Precautions Precautions: Fall Precaution Comments: incontinent B/B, check before mobilizing      Mobility  Bed Mobility Overal bed mobility: Needs Assistance Bed Mobility: Rolling;Supine to Sit;Sit to Supine Rolling: Total assist;+2 for physical assistance;+2 for safety/equipment   Supine to sit: Total assist;+2 for physical assistance;+2 for safety/equipment Sit to supine: Total assist;+2 for physical assistance;+2 for safety/equipment   General bed mobility comments: patient  unable to asist in mobility. Assisted to sitting x ~ 8 minutes.    Transfers                 General transfer comment: requires a mechanical lift  Ambulation/Gait             General Gait Details: nonambulatory  Stairs            Wheelchair Mobility    Modified Rankin (Stroke Patients Only)       Balance Overall balance assessment: Needs assistance Sitting-balance support: Single extremity supported;Feet supported Sitting balance-Leahy Scale: Zero Sitting balance - Comments: no balance reations, unabl e to support with 1 UE with =out max support                                     Pertinent Vitals/Pain Pain Assessment: Faces Faces Pain Scale: Hurts even more Pain Location: back, buttocks Pain Descriptors / Indicators: Grimacing;Moaning Pain Intervention(s): Limited activity within patient's tolerance;Monitored during session    Home Living Family/patient expects to be discharged to:: Skilled nursing facility                 Additional Comments: from [facility]    Prior Function Level of Independence: Needs assistance         Comments: patient requires total care, mechanical lift from bed     Hand Dominance        Extremity/Trunk Assessment        Lower Extremity Assessment Lower Extremity Assessment: RLE deficits/detail;LLE deficits/detail RLE Deficits / Details: mostly  flaccid, no volitional movement noted, appreciates LT LLE Deficits / Details: increased flexor tone/spasm noted, leg positioned in ER and flexed. appreciates LT    Cervical / Trunk Assessment Cervical / Trunk Assessment: Other exceptions Cervical / Trunk Exceptions: head rotated/tilted to left. able to turn to ~ midline. Trunk is noted to have  lateral side bend to left when sitting  Communication      Cognition Arousal/Alertness: Awake/alert Behavior During Therapy: WFL for tasks assessed/performed Overall Cognitive Status: Within  Functional Limits for tasks assessed                                 General Comments: overall WFL for situation      General Comments      Exercises     Assessment/Plan    PT Assessment Patient needs continued PT services  PT Problem List Decreased range of motion;Decreased strength;Impaired tone;Decreased activity tolerance;Decreased balance;Decreased mobility       PT Treatment Interventions Therapeutic activities;Therapeutic exercise;Functional mobility training;Balance training    PT Goals (Current goals can be found in the Care Plan section)  Acute Rehab PT Goals Patient Stated Goal: to get up, brush my teeth PT Goal Formulation: With patient Time For Goal Achievement: 07/24/20 Potential to Achieve Goals: Fair    Frequency Min 1X/week   Barriers to discharge        Co-evaluation               AM-PAC PT "6 Clicks" Mobility  Outcome Measure Help needed turning from your back to your side while in a flat bed without using bedrails?: Total Help needed moving from lying on your back to sitting on the side of a flat bed without using bedrails?: Total Help needed moving to and from a bed to a chair (including a wheelchair)?: Total Help needed standing up from a chair using your arms (e.g., wheelchair or bedside chair)?: Total Help needed to walk in hospital room?: Total Help needed climbing 3-5 steps with a railing? : Total 6 Click Score: 6    End of Session   Activity Tolerance: Patient tolerated treatment well Patient left: in bed;with call bell/phone within reach Nurse Communication: Mobility status;Need for lift equipment PT Visit Diagnosis: Other symptoms and signs involving the nervous system (R29.898)    Time: 2202-5427 PT Time Calculation (min) (ACUTE ONLY): 38 min   Charges:   PT Evaluation $PT Eval Low Complexity: [place] Pager [phone] Office [phone]   Claretha Cooper 07/10/2020, 2:21 PM

## 2020-07-10 NOTE — Progress Notes (Signed)
Subjective: No new complaints, sleepy   Antibiotics:  Anti-infectives (From admission, onward)   Start     Dose/Rate Route Frequency Ordered Stop   08/12/20 1000  vancomycin (VANCOCIN) 50 mg/mL oral solution 125 mg  Status:  Discontinued       "Followed by" Linked Group Details   125 mg Oral Every 3 DAYS 07/07/20 0557 07/09/20 1358   08/04/20 1000  vancomycin (VANCOCIN) 50 mg/mL oral solution 125 mg  Status:  Discontinued       "Followed by" Linked Group Details   125 mg Oral Every other day 07/07/20 0557 07/09/20 1358   07/28/20 1000  vancomycin (VANCOCIN) 50 mg/mL oral solution 125 mg  Status:  Discontinued       "Followed by" Linked Group Details   125 mg Oral Daily 07/07/20 0557 07/09/20 1358   07/21/20 1000  vancomycin (VANCOCIN) 50 mg/mL oral solution 125 mg  Status:  Discontinued       "Followed by" Linked Group Details   125 mg Oral 2 times daily 07/07/20 0557 07/09/20 1358   07/09/20 2200  vancomycin (VANCOCIN) 50 mg/mL oral solution 125 mg       "Followed by" Linked Group Details   125 mg Oral Every 12 hours 07/09/20 1358     07/09/20 1700  Ampicillin-Sulbactam (UNASYN) 3 g in sodium chloride 0.9 % 100 mL IVPB        3 g 200 mL/hr over 30 Minutes Intravenous Every 6 hours 07/09/20 1348     07/08/20 0600  cefTRIAXone (ROCEPHIN) 1 g in sodium chloride 0.9 % 100 mL IVPB  Status:  Discontinued        1 g 200 mL/hr over 30 Minutes Intravenous Every 24 hours 07/07/20 0540 07/07/20 2016   07/07/20 2200  metroNIDAZOLE (FLAGYL) tablet 500 mg  Status:  Discontinued        500 mg Oral Every 8 hours 07/07/20 1932 07/09/20 1348   07/07/20 2200  cefTRIAXone (ROCEPHIN) 2 g in sodium chloride 0.9 % 100 mL IVPB  Status:  Discontinued        2 g 200 mL/hr over 30 Minutes Intravenous Every 24 hours 07/07/20 2016 07/09/20 1348   07/07/20 2100  vancomycin (VANCOREADY) IVPB 1250 mg/250 mL  Status:  Discontinued        1,250 mg 166.7 mL/hr over 90 Minutes Intravenous Daily  07/07/20 1947 07/09/20 1348   07/07/20 1000  vancomycin (VANCOCIN) 50 mg/mL oral solution 125 mg  Status:  Discontinued       "Followed by" Linked Group Details   125 mg Oral 4 times daily 07/07/20 0557 07/09/20 1358   07/07/20 0245  cefTRIAXone (ROCEPHIN) 1 g in sodium chloride 0.9 % 100 mL IVPB        1 g 200 mL/hr over 30 Minutes Intravenous  Once 07/07/20 0237 07/07/20 0635      Medications: Scheduled Meds: . vitamin C  500 mg Oral BID  . baclofen  20 mg Oral TID with meals  . baclofen  30 mg Oral QHS  . Chlorhexidine Gluconate Cloth  6 each Topical Daily  . cholecalciferol  6,000 Units Oral Daily  . collagenase   Topical BID  . dicyclomine  20 mg Oral TID AC  . enoxaparin (LOVENOX) injection  40 mg Subcutaneous Q24H  . feeding supplement (PRO-STAT SUGAR FREE 64)  30 mL Oral BID  . ferrous sulfate  325 mg Oral Q breakfast  . folic acid  1 mg Oral Daily  . loratadine  10 mg Oral Daily  . mirtazapine  7.5 mg Oral QHS  . polyethylene glycol  17 g Oral BID  . potassium chloride  10 mEq Oral Daily  . pravastatin  40 mg Oral Daily  . senna  2 tablet Oral QHS  . tamsulosin  0.4 mg Oral Daily  . tiZANidine  4 mg Oral QHS  . vancomycin  125 mg Oral Q12H  . vitamin B-12  1,000 mcg Oral Daily   Continuous Infusions: . ampicillin-sulbactam (UNASYN) IV 3 g (07/10/20 0421)   PRN Meds:.lip balm, LORazepam, sodium chloride flush    Objective: Weight change:   Intake/Output Summary (Last 24 hours) at 07/10/2020 1009 Last data filed at 07/09/2020 1911 Gross per 24 hour  Intake 970 ml  Output 800 ml  Net 170 ml   Blood pressure 110/61, pulse 68, temperature 98 F (36.7 C), temperature source Oral, resp. rate 20, height $RemoveBe'5\' 5"'pORdTgfNM$  (1.651 m), weight 61.2 kg, SpO2 98 %. Temp:  [98 F (36.7 C)-98.1 F (36.7 C)] 98 F (36.7 C) (11/23 0639) Pulse Rate:  [68-92] 68 (11/23 0639) Resp:  [16-20] 20 (11/23 0639) BP: (110-117)/(61-62) 110/61 (11/23 0639) SpO2:  [98 %] 98 % (11/23  9629)  Physical Exam: General: sleepy but arousable and answers questions HEENT: anicteric sclera, EOMI CVS regular rate, normal  Chest: , no wheezing, no respiratory distress Abdomen: soft non-distended,  Extremities: no edema or deformity noted bilaterally Skin: no rashes Neuro: nonfocal  CBC:    BMET Recent Labs    07/09/20 0410 07/10/20 0350  NA 141 142  K 3.3* 3.2*  CL 108 110  CO2 24 24  GLUCOSE 70 129*  BUN 10 10  CREATININE 0.48 0.66  CALCIUM 7.9* 7.8*     Liver Panel  Recent Labs    07/09/20 0410 07/10/20 0350  PROT 4.6* 4.6*  ALBUMIN 1.7* 1.7*  AST 11* 12*  ALT 8 9  ALKPHOS 68 65  BILITOT 0.4 0.4       Sedimentation Rate Recent Labs    07/08/20 0915  ESRSEDRATE 65*   C-Reactive Protein Recent Labs    07/08/20 0915  CRP 10.7*    Micro Results: Recent Results (from the past 720 hour(s))  Blood culture (routine single)     Status: None (Preliminary result)   Collection Time: 07/07/20  1:02 AM   Specimen: BLOOD  Result Value Ref Range Status   Specimen Description   Final    BLOOD RIGHT WRIST Performed at [facility] [address] [place], [place] [address]    Special Requests   Final    BOTTLES DRAWN AEROBIC AND ANAEROBIC Blood Culture adequate volume Performed at [facility] [address], [place], [place] [address]    Culture   Final    NO GROWTH 3 DAYS Performed at [facility], [place] [address] [place], [place] [address]    Report Status PENDING  Incomplete  Urine culture     Status: Abnormal (Preliminary result)   Collection Time: 07/07/20  1:02 AM   Specimen: In/Out Cath Urine  Result Value Ref Range Status   Specimen Description   Final    IN/OUT CATH URINE Performed at [facility] [address], [place], [place] [address]    Special Requests   Final    NONE Performed at [facility], [place] [address], [place], [place]  [address]  Culture (A)  Final    >=100,000 COLONIES/mL PSEUDOMONAS AERUGINOSA SUSCEPTIBILITIES TO FOLLOW Performed at [facility], [address], [place], [place] [address]    Report Status PENDING  Incomplete  Resp Panel by RT-PCR (Flu A&B, Covid) Nasopharyngeal Swab     Status: None   Collection Time: 07/07/20  5:17 AM   Specimen: Nasopharyngeal Swab; Nasopharyngeal(NP) swabs in vial transport medium  Result Value Ref Range Status   SARS Coronavirus 2 by RT PCR NEGATIVE NEGATIVE Final    Comment: (NOTE) SARS-CoV-2 target nucleic acids are NOT DETECTED.  The SARS-CoV-2 RNA is generally detectable in upper respiratory specimens during the acute phase of infection. The lowest concentration of SARS-CoV-2 viral copies this assay can detect is 138 copies/mL. A negative result does not preclude SARS-Cov-2 infection and should not be used as the sole basis for treatment or other patient management decisions. A negative result may occur with  improper specimen collection/handling, submission of specimen other than nasopharyngeal swab, presence of viral mutation(s) within the areas targeted by this assay, and inadequate number of viral copies(<138 copies/mL). A negative result must be combined with clinical observations, patient history, and epidemiological information. The expected result is Negative.  Fact Sheet for Patients:  BloggerCourse.com  Fact Sheet for Healthcare Providers:  SeriousBroker.it  This test is no t yet approved or cleared by the [place] FDA and  has been authorized for detection and/or diagnosis of SARS-CoV-2 by FDA under an Emergency Use Authorization (EUA). This EUA will remain  in effect (meaning this test can be used) for the duration of the COVID-19 declaration under Section 564(b)(1) of the Act, 21 U.S.C.section 360bbb-3(b)(1), unless the authorization is terminated  or revoked sooner.        Influenza A by PCR NEGATIVE NEGATIVE Final   Influenza B by PCR NEGATIVE NEGATIVE Final    Comment: (NOTE) The Xpert Xpress SARS-CoV-2/FLU/RSV plus assay is intended as an aid in the diagnosis of influenza from Nasopharyngeal swab specimens and should not be used as a sole basis for treatment. Nasal washings and aspirates are unacceptable for Xpert Xpress SARS-CoV-2/FLU/RSV testing.  Fact Sheet for Patients: BloggerCourse.com  Fact Sheet for Healthcare Providers: SeriousBroker.it  This test is not yet approved or cleared by the [place] FDA and has been authorized for detection and/or diagnosis of SARS-CoV-2 by FDA under an Emergency Use Authorization (EUA). This EUA will remain in effect (meaning this test can be used) for the duration of the COVID-19 declaration under Section 564(b)(1) of the Act, 21 U.S.C. section 360bbb-3(b)(1), unless the authorization is terminated or revoked.  Performed at [facility], [address] [place], [place] [address]     Studies/Results: CT ABDOMEN PELVIS W CONTRAST  Result Date: 07/08/2020 CLINICAL DATA:  Abdominal abscess/infection suspected. Sacral wound infection. Back pain. EXAM: CT ABDOMEN AND PELVIS WITH CONTRAST TECHNIQUE: Multidetector CT imaging of the abdomen and pelvis was performed using the standard protocol following bolus administration of intravenous contrast. CONTRAST:  OMNIPAQUE IOHEXOL 300 MG/ML  SOLN COMPARISON:  CT dated 10/03/2019 FINDINGS: Lower chest: There are trace bilateral pleural effusions, left greater than right. There is bibasilar atelectasis, left worse than right.The heart size is normal. Hepatobiliary: The liver is normal. Normal gallbladder.There is no biliary ductal dilation. Pancreas: Normal contours without ductal dilatation. No peripancreatic fluid collection. Spleen: Unremarkable. Adrenals/Urinary Tract: --Adrenal glands:  Unremarkable. --Right kidney/ureter: There is severe right-sided hydroureteronephrosis to the level of the urinary bladder. --Left kidney/ureter: There is  severe left-sided hydronephrosis to the level of the urinary bladder. There are areas of cortical thinning and scarring. The left-sided collecting system is partially duplicated. --Urinary bladder: The urinary bladder is trabeculated with multiple bladder diverticula. There is diffuse bladder wall thickening with adjacent fat stranding. Stomach/Bowel: --Stomach/Duodenum: No hiatal hernia or other gastric abnormality. Normal duodenal course and caliber. --Small bowel: Unremarkable. --Colon: There is a large amount of stool throughout the colon. --Appendix: Normal. Vascular/Lymphatic: Atherosclerotic calcification is present within the non-aneurysmal abdominal aorta, without hemodynamically significant stenosis. There is a questionable filling defect within the right common femoral vein (axial series 2, image 75). --No retroperitoneal lymphadenopathy. --No mesenteric lymphadenopathy. --No pelvic or inguinal lymphadenopathy. Reproductive: Unremarkable Other: There is mild body wall edema. There are few pockets of subcutaneous gas on the patient's low anterior left abdomen favored to represent sequela of a prior subcutaneous injection. Musculoskeletal. 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 There is presacral edema which is likely reactive. These findings are new since the patient's CT dated 10/03/2019. IMPRESSION: 1. 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 These findings are new since the patient's CT dated 10/03/2019. 2. There is a questionable filling defect within the right common femoral vein, concerning for DVT. Recommend further evaluation with a dedicated right lower extremity venous Doppler ultrasound. 3.  Severe bilateral hydroureteronephrosis to the level of the urinary bladder. This is likely secondary to chronic bladder outlet obstruction. 4. Diffuse bladder wall thickening with adjacent fat stranding is concerning for cystitis. Correlation with urinalysis is recommended. 5. Trace bilateral pleural effusions, left greater than right, with bibasilar atelectasis, left worse than right. 6. Large amount of stool throughout the colon. Aortic Atherosclerosis (ICD10-I70.0). Electronically Signed   By: Constance Holster M.D.   On: 07/08/2020 22:47   VAS [place] LOWER EXTREMITY VENOUS (DVT)  Result Date: 07/09/2020  Lower Venous DVT Study Indications: Swelling.  Risk Factors: Immobility. Limitations: Poor ultrasound/tissue interface and patient positioning, patient involuntary movement. Comparison Study: No prior studies. Performing Technologist: Oliver Hum RVT  Examination Guidelines: A complete evaluation includes B-mode imaging, spectral Doppler, color Doppler, and power Doppler as needed of all accessible portions of each vessel. Bilateral testing is considered an integral part of a complete examination. Limited examinations for reoccurring indications may be performed as noted. The reflux portion of the exam is performed with the patient in reverse Trendelenburg.  +---------+---------------+---------+-----------+----------+--------------+ RIGHT    CompressibilityPhasicitySpontaneityPropertiesThrombus Aging +---------+---------------+---------+-----------+----------+--------------+ CFV      Full           Yes      Yes                                 +---------+---------------+---------+-----------+----------+--------------+ SFJ      Full                                                        +---------+---------------+---------+-----------+----------+--------------+ FV Prox  Full                                                         +---------+---------------+---------+-----------+----------+--------------+  FV Mid   Full                                                        +---------+---------------+---------+-----------+----------+--------------+ FV DistalFull                                                        +---------+---------------+---------+-----------+----------+--------------+ PFV      Full                                                        +---------+---------------+---------+-----------+----------+--------------+ POP      Full           Yes      Yes                                 +---------+---------------+---------+-----------+----------+--------------+ PTV      Full                                                        +---------+---------------+---------+-----------+----------+--------------+ PERO     Full                                                        +---------+---------------+---------+-----------+----------+--------------+   +---------+---------------+---------+-----------+----------+--------------+ LEFT     CompressibilityPhasicitySpontaneityPropertiesThrombus Aging +---------+---------------+---------+-----------+----------+--------------+ CFV      Full           Yes      Yes                                 +---------+---------------+---------+-----------+----------+--------------+ SFJ      Full                                                        +---------+---------------+---------+-----------+----------+--------------+ FV Prox  Full                                                        +---------+---------------+---------+-----------+----------+--------------+ FV Mid   Full                                                        +---------+---------------+---------+-----------+----------+--------------+  FV DistalFull                                                         +---------+---------------+---------+-----------+----------+--------------+ PFV      Full                                                        +---------+---------------+---------+-----------+----------+--------------+ POP      Full           Yes      Yes                                 +---------+---------------+---------+-----------+----------+--------------+ PTV      Full                                                        +---------+---------------+---------+-----------+----------+--------------+ PERO     Full                                                        +---------+---------------+---------+-----------+----------+--------------+     Summary: RIGHT: - There is no evidence of deep vein thrombosis in the lower extremity. However, portions of this examination were limited- see technologist comments above.  - No cystic structure found in the popliteal fossa.  LEFT: - There is no evidence of deep vein thrombosis in the lower extremity. However, portions of this examination were limited- see technologist comments above.  - No cystic structure found in the popliteal fossa.  *See table(s) above for measurements and observations. Electronically signed by Monica Martinez MD on 07/09/2020 at 4:41:06 PM.    Final       Assessment/Plan:  INTERVAL HISTORY: changed back to unasyn yesterday   Principal Problem:   Acute encephalopathy Active Problems:   Hypertension   Multiple sclerosis, primary progressive (HCC)   Recurrent colitis due to Clostridium difficile   Anemia   Acute lower UTI   Acute metabolic encephalopathy   Pressure injury of skin    Renee Huang is a 64 y.o. female with  Recurrent CDI followed by Dr. Baxter Flattery now found to have sacral decubitus probing to bone with CT showing coccygeal osteomyelitis  She was placed on broad spectrum abx in context of anxiety re SIRS< UTI but more likely was confused due to opiates  #1 Osteomyelitis  Switched  back to unasyn and will continue through 08/02/2020 and see Dr. Baxter Flattery on the 13th  Case d/w Dr Baxter Flattery this am  #2 CDI: continue BID vanco to try to prevent recurrence  #3 Delirium appears related to sedatives  Diagnosis: Osteomyelitis  Culture Result: None  No Known Allergies  OPAT Orders Unasyn  Duration:  4 weeks End Date:  August 02, 2020  [facility] Per Protocol:  Labs BI-  weekly while on IV antibiotics:  _x_ CBC with differential x__ BMP w GFR/CMP x__ CRP x__ ESR    __ Please pull PIC at completion of IV antibiotics _x_ Please leave PIC in place until doctor has seen patient or been notified  Fax weekly labs to [phone]   Ar[place] has an appointment on the 13th, 2021 at 10 AM with Dr. Baxter Flattery  [facility] for Infectious Disease is located in the [facility] at  [place] in [place].  [address], which is located to the left of the elevators.  Phone: [phone]  Fax: [phone]  [email or web address]  She should arrive 15 minutes PRIOR to her appt IN PERSON  I will sign off for now  Please call with further questions.     LOS: 3 days   Alcide Evener 07/10/2020, 10:09 AM

## 2020-07-10 NOTE — Progress Notes (Signed)
PROGRESS NOTE    Renee Huang  DQQ:[id number] DOB: April 16, 1956 DOA: 07/07/2020 PCP: [name], DO   Chief Complaint  Patient presents with  . Altered Mental Status   Brief Narrative:  Renee Huang is a 64 y.o. female with history of primary progressive multiple sclerosis presently not on any disease modifying medication was found to be lethargic at the facility and was brought to the ER.  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  Patient had worsening back pain and as per the report was given some hydrocodone following which patient became more lethargic.  It was told that patient was hypotensive.  ED Course: In the ER patient is mildly lethargic but answering questions appropriately and he is giving good history.  UA is concerning for UTI.  Patient is maintaining his sats.  Patient states she took some hydrocodone after she had some worsening back pain yesterday following which she became more confused.  Patient denies any headache nausea vomiting or abdominal pain.  In the ER patient had one episode of loose stools.  Labs showing worsening anemia usually hemoglobin is around 9 at baseline is around 7 at this time.  Stool did not look melanotic in the ER as per the ER physician.  Patient was started on ceftriaxone for UTI admitted for lethargy/acute encephalopathy could be related to pain medication although could be secondary to UTI.  Covid test was negative.  Assessment & Plan:  Acute metabolic encephalopathy It looks like the precipitant event was administration of hydrocodone that was given at her nursing facility for back pain.  Patient was noted to become more lethargic after given this pain medication.  Infection also likely contributing including UTI as well as infected stage IV decubitus ulcer.  Patient's Norco and tizanidine were placed on hold.  Baclofen is being continued.  Ammonia level was normal.  B12 level was normal.  HIV  nonreactive.  Her mentation was back to baseline.  However this morning she is noted to be somewhat distracted.  Talking about her nightmare.  No focal deficits noted.  She was given a dose of Haldol which seems to have helped but she remains lethargic though easily arousable.  We will cut back on the dose of her baclofen at night.  Stop the tizanidine again.  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  Apparently these findings are new compared to CT scan done in February which was not done in our system.  Seen by general surgery.  No plans for debridement of the sacral decubitus.   Seen by infectious disease.  Apparently plan was for intravenous antibiotics in the outpatient setting before she was sent to the ER.  She has a PICC line already.  Unasyn has been ordered for 4 weeks.  Procalcitonin 0.27.  CRP was 10.7.  WBC was elevated at 18.1 and is normal today.  Abnormal appearing right common femoral vein Possible filling defect noted on CT scan.  Lower extremity Doppler studies did not show any DVT.  No further work-up at this time.    Severe bilateral hydroureteronephrosis Thought to be secondary to chronic bladder outlet obstruction.  Noted on previous CT scans as well.  Patient has been able to pass urine.  Noted to be on Flomax which will be continued.  Diffuse bladder wall  thickening was also noted raising concern for cystitis.  See below.    Abnormal urine culture with Pseudomonas thought to be colonization  Urine culture growing Pseudomonas.  Care everywhere was reviewed.  Patient with numerous positive urine cultures over the last year or so.  She is likely colonized.  Discussed with infectious disease.  No need to treat this specifically especially considering her recurrent C.  difficile.  Recurrent C. difficile colitis On oral vancomycin.  Enteric precautions.  On an extended course of oral vancomycin while she is on other antibacterials.  ID has changed her vancomycin to twice daily.  Normocytic anemia/folate deficiency/iron deficiency Hemoglobin low but stable.  No evidence of overt bleeding.    Hypokalemia Potassium to be replaced.  Magnesium 2.0.    Hypoalbuminemia Likely due to poor nutrition.  Hyperlipidemia Statin.  Primary progressive multiple sclerosis This is the reason for her bedbound status.  Constipation Large amount of stool noted throughout the colon.  Will place her on a bowel regimen.  Trace bilateral pleural effusions Not significant.  No respiratory issues currently.  DVT prophylaxis: lovenox Code Status: full  Family Communication: None at bedside Disposition: From skilled nursing facility.  Back to SNF hopefully in the next 24 to 48 hours depending on her mentation.    Status is: Inpatient  Remains inpatient appropriate because:IV treatments appropriate due to intensity of illness or inability to take PO and Inpatient level of care appropriate due to severity of illness   Dispo:  Patient From: [facility]  Planned Disposition: [facility]  Expected discharge date: 07/11/20  Medically stable for discharge: No     Consultants:  General surgery Infectious disease  Procedures:   none  Antimicrobials:  Anti-infectives (From admission, onward)   Start     Dose/Rate Route Frequency Ordered Stop   08/12/20 1000  vancomycin (VANCOCIN) 50 mg/mL oral solution 125 mg  Status:  Discontinued       "Followed by" Linked Group Details   125 mg Oral Every 3 DAYS 07/07/20 0557 07/09/20 1358   08/04/20 1000  vancomycin (VANCOCIN) 50 mg/mL oral solution 125 mg  Status:  Discontinued       "Followed by" Linked Group Details   125 mg Oral Every other day 07/07/20 0557 07/09/20 1358   07/28/20 1000   vancomycin (VANCOCIN) 50 mg/mL oral solution 125 mg  Status:  Discontinued       "Followed by" Linked Group Details   125 mg Oral Daily 07/07/20 0557 07/09/20 1358   07/21/20 1000  vancomycin (VANCOCIN) 50 mg/mL oral solution 125 mg  Status:  Discontinued       "Followed by" Linked Group Details   125 mg Oral 2 times daily 07/07/20 0557 07/09/20 1358   07/10/20 0000  ampicillin-sulbactam (UNASYN) IVPB        3 g Intravenous Every 6 hours 07/10/20 1020 08/05/20 2359   07/10/20 0000  vancomycin (VANCOCIN) 50 mg/mL oral solution        125 mg Oral Every 12 hours 07/10/20 1020 08/05/20 2359   07/09/20 2200  vancomycin (VANCOCIN) 50 mg/mL oral solution 125 mg       "Followed by" Linked Group Details   125 mg Oral Every 12 hours 07/09/20 1358     07/09/20 1700  Ampicillin-Sulbactam (UNASYN) 3 g in sodium chloride 0.9 % 100 mL IVPB        3 g 200 mL/hr over 30 Minutes Intravenous Every 6 hours 07/09/20 1348  07/08/20 0600  cefTRIAXone (ROCEPHIN) 1 g in sodium chloride 0.9 % 100 mL IVPB  Status:  Discontinued        1 g 200 mL/hr over 30 Minutes Intravenous Every 24 hours 07/07/20 0540 07/07/20 2016   07/07/20 2200  metroNIDAZOLE (FLAGYL) tablet 500 mg  Status:  Discontinued        500 mg Oral Every 8 hours 07/07/20 1932 07/09/20 1348   07/07/20 2200  cefTRIAXone (ROCEPHIN) 2 g in sodium chloride 0.9 % 100 mL IVPB  Status:  Discontinued        2 g 200 mL/hr over 30 Minutes Intravenous Every 24 hours 07/07/20 2016 07/09/20 1348   07/07/20 2100  vancomycin (VANCOREADY) IVPB 1250 mg/250 mL  Status:  Discontinued        1,250 mg 166.7 mL/hr over 90 Minutes Intravenous Daily 07/07/20 1947 07/09/20 1348   07/07/20 1000  vancomycin (VANCOCIN) 50 mg/mL oral solution 125 mg  Status:  Discontinued       "Followed by" Linked Group Details   125 mg Oral 4 times daily 07/07/20 0557 07/09/20 1358   07/07/20 0245  cefTRIAXone (ROCEPHIN) 1 g in sodium chloride 0.9 % 100 mL IVPB        1 g 200 mL/hr over  30 Minutes Intravenous  Once 07/07/20 0237 07/07/20 0635      Subjective: Patient noted to be confused and distracted this morning.  Denies any pain.  Oriented to place,, month and year.  Objective: Vitals:   07/09/20 2200 07/10/20 0000 07/10/20 0100 07/10/20 0639  BP:    110/61  Pulse:    68  Resp: 16 18 16 20   Temp:    98 F (36.7 C)  TempSrc:    Oral  SpO2:    98%  Weight:      Height:        Intake/Output Summary (Last 24 hours) at 07/10/2020 1203 Last data filed at 07/10/2020 0900 Gross per 24 hour  Intake 970 ml  Output 800 ml  Net 170 ml   Filed Weights   07/07/20 0112  Weight: 61.2 kg    Examination:  General appearance: Awake alert.  In no distress.  Noted to be distracted Resp: Clear to auscultation bilaterally.  Normal effort Cardio: S1-S2 is normal regular.  No S3-S4.  No rubs murmurs or bruit GI: Abdomen is soft.  Nontender nondistended.  Bowel sounds are present normal.  No masses organomegaly Neurologic: Alert.  Distracted.  Mildly confused.  Reoriented.  No focal deficits except for the fact that she has MS and unable to move her lower extremities.       Data Reviewed: I have personally reviewed following labs and imaging studies  CBC: Recent Labs  Lab 07/07/20 0102 07/07/20 0605 07/08/20 0915 07/09/20 0410 07/10/20 0350  WBC 14.6* 18.1* 12.3* 11.4* 9.6  NEUTROABS 11.6* 14.3* 9.8*  --   --   HGB 7.8* 8.9* 8.1* 8.0* 7.9*  HCT 26.4* 29.5* 27.5* 26.3* 26.7*  MCV 85.7 85.3 84.9 85.4 85.3  PLT 465* 630* 517* 483* 416*    Basic Metabolic Panel: Recent Labs  Lab 07/07/20 0102 07/07/20 0605 07/08/20 0915 07/09/20 0410 07/10/20 0350  NA 139 141 140 141 142  K 4.0 3.6 3.2* 3.3* 3.2*  CL 105 105 109 108 110  CO2 26 26 28 24 24   GLUCOSE 88 73 80 70 129*  BUN 18 15 10 10 10   CREATININE 0.62 0.58 0.50 0.48 0.66  CALCIUM 7.8*  8.1* 7.9* 7.9* 7.8*  MG  --   --  1.7  --  2.0  PHOS  --   --  2.4*  --   --     GFR: Estimated Creatinine  Clearance: 63.9 mL/min (by C-G formula based on SCr of 0.66 mg/dL).  Liver Function Tests: Recent Labs  Lab 07/07/20 0102 07/07/20 0605 07/08/20 0915 07/09/20 0410 07/10/20 0350  AST 10* 13* 12* 11* 12*  ALT 7 7 7 8 9   ALKPHOS 75 84 71 68 65  BILITOT 0.5 0.5 0.2* 0.4 0.4  PROT 4.5* 5.1* 4.7* 4.6* 4.6*  ALBUMIN 1.6* 1.8* 1.6* 1.7* 1.7*     Recent Results (from the past 240 hour(s))  Blood culture (routine single)     Status: None (Preliminary result)   Collection Time: 07/07/20  1:02 AM   Specimen: BLOOD  Result Value Ref Range Status   Specimen Description   Final    BLOOD RIGHT WRIST Performed at [facility], [place] [address], [place], [place] [address]    Special Requests   Final    BOTTLES DRAWN AEROBIC AND ANAEROBIC Blood Culture adequate volume Performed at [facility] [address] [place], [place] [address]    Culture   Final    NO GROWTH 3 DAYS Performed at [facility], [place] [address], [place], [place] [address]    Report Status PENDING  Incomplete  Urine culture     Status: Abnormal (Preliminary result)   Collection Time: 07/07/20  1:02 AM   Specimen: In/Out Cath Urine  Result Value Ref Range Status   Specimen Description   Final    IN/OUT CATH URINE Performed at [facility] [address] [place], [place] [address]    Special Requests   Final    NONE Performed at [facility], [place] [address], [place], [place] [address]    Culture (A)  Final    >=100,000 COLONIES/mL PSEUDOMONAS AERUGINOSA SUSCEPTIBILITIES TO FOLLOW Performed at [facility], [place] [address], [place], [place] [address]    Report Status PENDING  Incomplete  Resp Panel by RT-PCR (Flu A&B, Covid) Nasopharyngeal Swab     Status: None   Collection Time: 07/07/20  5:17 AM   Specimen: Nasopharyngeal Swab; Nasopharyngeal(NP) swabs in vial transport medium  Result Value Ref Range Status   SARS  Coronavirus 2 by RT PCR NEGATIVE NEGATIVE Final    Comment: (NOTE) SARS-CoV-2 target nucleic acids are NOT DETECTED.  The SARS-CoV-2 RNA is generally detectable in upper respiratory specimens during the acute phase of infection. The lowest concentration of SARS-CoV-2 viral copies this assay can detect is 138 copies/mL. A negative result does not preclude SARS-Cov-2 infection and should not be used as the sole basis for treatment or other patient management decisions. A negative result may occur with  improper specimen collection/handling, submission of specimen other than nasopharyngeal swab, presence of viral mutation(s) within the areas targeted by this assay, and inadequate number of viral copies(<138 copies/mL). A negative result must be combined with clinical observations, patient history, and epidemiological information. The expected result is Negative.  Fact Sheet for Patients:  EntrepreneurPulse.com.au  Fact Sheet for Healthcare Providers:  IncredibleEmployment.be  This test is no t yet approved or cleared by the [place] FDA and  has been authorized for detection and/or diagnosis of SARS-CoV-2 by FDA under an Emergency Use Authorization (EUA). This EUA will remain  in effect (meaning this test  can be used) for the duration of the COVID-19 declaration under Section 564(b)(1) of the Act, 21 U.S.C.section 360bbb-3(b)(1), unless the authorization is terminated  or revoked sooner.       Influenza A by PCR NEGATIVE NEGATIVE Final   Influenza B by PCR NEGATIVE NEGATIVE Final    Comment: (NOTE) The Xpert Xpress SARS-CoV-2/FLU/RSV plus assay is intended as an aid in the diagnosis of influenza from Nasopharyngeal swab specimens and should not be used as a sole basis for treatment. Nasal washings and aspirates are unacceptable for Xpert Xpress SARS-CoV-2/FLU/RSV testing.  Fact Sheet for  Patients: EntrepreneurPulse.com.au  Fact Sheet for Healthcare Providers: IncredibleEmployment.be  This test is not yet approved or cleared by the [place] FDA and has been authorized for detection and/or diagnosis of SARS-CoV-2 by FDA under an Emergency Use Authorization (EUA). This EUA will remain in effect (meaning this test can be used) for the duration of the COVID-19 declaration under Section 564(b)(1) of the Act, 21 U.S.C. section 360bbb-3(b)(1), unless the authorization is terminated or revoked.  Performed at [facility], [place] [address], [place], [place] [address]          Radiology Studies: CT ABDOMEN PELVIS W CONTRAST  Result Date: 07/08/2020 CLINICAL DATA:  Abdominal abscess/infection suspected. Sacral wound infection. Back pain. EXAM: CT ABDOMEN AND PELVIS WITH CONTRAST TECHNIQUE: Multidetector CT imaging of the abdomen and pelvis was performed using the standard protocol following bolus administration of intravenous contrast. CONTRAST:  180mL OMNIPAQUE IOHEXOL 300 MG/ML  SOLN COMPARISON:  CT dated 10/03/2019 FINDINGS: Lower chest: There are trace bilateral pleural effusions, left greater than right. There is bibasilar atelectasis, left worse than right.The heart size is normal. Hepatobiliary: The liver is normal. Normal gallbladder.There is no biliary ductal dilation. Pancreas: Normal contours without ductal dilatation. No peripancreatic fluid collection. Spleen: Unremarkable. Adrenals/Urinary Tract: --Adrenal glands: Unremarkable. --Right kidney/ureter: There is severe right-sided hydroureteronephrosis to the level of the urinary bladder. --Left kidney/ureter: There is severe left-sided hydronephrosis to the level of the urinary bladder. There are areas of cortical thinning and scarring. The left-sided collecting system is partially duplicated. --Urinary bladder: The urinary bladder is trabeculated with multiple  bladder diverticula. There is diffuse bladder wall thickening with adjacent fat stranding. Stomach/Bowel: --Stomach/Duodenum: No hiatal hernia or other gastric abnormality. Normal duodenal course and caliber. --Small bowel: Unremarkable. --Colon: There is a large amount of stool throughout the colon. --Appendix: Normal. Vascular/Lymphatic: Atherosclerotic calcification is present within the non-aneurysmal abdominal aorta, without hemodynamically significant stenosis. There is a questionable filling defect within the right common femoral vein (axial series 2, image 75). --No retroperitoneal lymphadenopathy. --No mesenteric lymphadenopathy. --No pelvic or inguinal lymphadenopathy. Reproductive: Unremarkable Other: There is mild body wall edema. There are few pockets of subcutaneous gas on the patient's low anterior left abdomen favored to represent sequela of a prior subcutaneous injection. Musculoskeletal. 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 There is presacral edema which is likely reactive. These findings are new since the patient's CT dated 10/03/2019. IMPRESSION: 1. 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 These findings are new since the patient's CT dated 10/03/2019. 2. There is a questionable filling defect within the right common femoral vein, concerning for DVT. Recommend further evaluation with a dedicated right lower extremity venous Doppler ultrasound. 3. Severe bilateral hydroureteronephrosis to the level of the urinary bladder. This is likely secondary to chronic bladder outlet obstruction.  4. Diffuse bladder wall thickening with adjacent fat stranding is concerning for cystitis. Correlation with urinalysis is recommended. 5. Trace bilateral pleural effusions, left greater than right, with bibasilar atelectasis, left worse than right. 6. Large amount  of stool throughout the colon. Aortic Atherosclerosis (ICD10-I70.0). Electronically Signed   By: Constance Holster M.D.   On: 07/08/2020 22:47   VAS [place] LOWER EXTREMITY VENOUS (DVT)  Result Date: 07/09/2020  Lower Venous DVT Study Indications: Swelling.  Risk Factors: Immobility. Limitations: Poor ultrasound/tissue interface and patient positioning, patient involuntary movement. Comparison Study: No prior studies. Performing Technologist: Oliver Hum RVT  Examination Guidelines: A complete evaluation includes B-mode imaging, spectral Doppler, color Doppler, and power Doppler as needed of all accessible portions of each vessel. Bilateral testing is considered an integral part of a complete examination. Limited examinations for reoccurring indications may be performed as noted. The reflux portion of the exam is performed with the patient in reverse Trendelenburg.  +---------+---------------+---------+-----------+----------+--------------+ RIGHT    CompressibilityPhasicitySpontaneityPropertiesThrombus Aging +---------+---------------+---------+-----------+----------+--------------+ CFV      Full           Yes      Yes                                 +---------+---------------+---------+-----------+----------+--------------+ SFJ      Full                                                        +---------+---------------+---------+-----------+----------+--------------+ FV Prox  Full                                                        +---------+---------------+---------+-----------+----------+--------------+ FV Mid   Full                                                        +---------+---------------+---------+-----------+----------+--------------+ FV DistalFull                                                        +---------+---------------+---------+-----------+----------+--------------+ PFV      Full                                                         +---------+---------------+---------+-----------+----------+--------------+ POP      Full           Yes      Yes                                 +---------+---------------+---------+-----------+----------+--------------+ PTV      Full                                                        +---------+---------------+---------+-----------+----------+--------------+  PERO     Full                                                        +---------+---------------+---------+-----------+----------+--------------+   +---------+---------------+---------+-----------+----------+--------------+ LEFT     CompressibilityPhasicitySpontaneityPropertiesThrombus Aging +---------+---------------+---------+-----------+----------+--------------+ CFV      Full           Yes      Yes                                 +---------+---------------+---------+-----------+----------+--------------+ SFJ      Full                                                        +---------+---------------+---------+-----------+----------+--------------+ FV Prox  Full                                                        +---------+---------------+---------+-----------+----------+--------------+ FV Mid   Full                                                        +---------+---------------+---------+-----------+----------+--------------+ FV DistalFull                                                        +---------+---------------+---------+-----------+----------+--------------+ PFV      Full                                                        +---------+---------------+---------+-----------+----------+--------------+ POP      Full           Yes      Yes                                 +---------+---------------+---------+-----------+----------+--------------+ PTV      Full                                                         +---------+---------------+---------+-----------+----------+--------------+ PERO     Full                                                        +---------+---------------+---------+-----------+----------+--------------+  Summary: RIGHT: - There is no evidence of deep vein thrombosis in the lower extremity. However, portions of this examination were limited- see technologist comments above.  - No cystic structure found in the popliteal fossa.  LEFT: - There is no evidence of deep vein thrombosis in the lower extremity. However, portions of this examination were limited- see technologist comments above.  - No cystic structure found in the popliteal fossa.  *See table(s) above for measurements and observations. Electronically signed by Monica Martinez MD on 07/09/2020 at 4:41:06 PM.    Final         Scheduled Meds: . vitamin C  500 mg Oral BID  . baclofen  20 mg Oral TID with meals  . baclofen  30 mg Oral QHS  . Chlorhexidine Gluconate Cloth  6 each Topical Daily  . cholecalciferol  6,000 Units Oral Daily  . collagenase   Topical BID  . dicyclomine  20 mg Oral TID AC  . enoxaparin (LOVENOX) injection  40 mg Subcutaneous Q24H  . feeding supplement (PRO-STAT SUGAR FREE 64)  30 mL Oral BID  . ferrous sulfate  325 mg Oral Q breakfast  . folic acid  1 mg Oral Daily  . loratadine  10 mg Oral Daily  . mirtazapine  7.5 mg Oral QHS  . polyethylene glycol  17 g Oral BID  . potassium chloride  10 mEq Oral Daily  . pravastatin  40 mg Oral Daily  . senna  2 tablet Oral QHS  . tamsulosin  0.4 mg Oral Daily  . tiZANidine  4 mg Oral QHS  . vancomycin  125 mg Oral Q12H  . vitamin B-12  1,000 mcg Oral Daily   Continuous Infusions: . ampicillin-sulbactam (UNASYN) IV 3 g (07/10/20 1127)     LOS: 3 days     Bonnielee Haff, MD [facility]   To contact the attending provider between 7A-7P or the covering provider during after hours 7P-7A, please log into the web site [email or web address]  and access using universal [place] password for that web site. If you do not have the password, please call the hospital operator.  07/10/2020, 12:03 PM

## 2020-07-10 NOTE — Evaluation (Signed)
Occupational Therapy Evaluation Patient Details Name: Renee Huang MRN: [id number] DOB: March 18, 1956 Today's Date: 07/10/2020    History of Present Illness Glinda Natzke is a 64 y.o. female with history of primary progressive multiple sclerosis presently not on any disease modifying medication was found to be lethargic at the facility and was brought to the ER.  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  Patient had worsening back pain and as per the report was given some hydrocodone following which patient became more lethargic.  It was told that patient was hypotensive.  Patient has stage 4 sacral pressure  wound.   Clinical Impression   Patient is currently requiring Total assistance with ADLs and patient's typical baseline is unsure at this time.  Pt reports that she has been at [facility] for 6 months and has required Total care and hoyer/mechanical lifts for unknown period of time.  Pt reports that prior to 6 months ago, she was independent with all ADLs..  During this evaluation, patient was limited by profound weakness and dystonia to flaccidity to all extremities and to core/trunk with very poor sitting balance.   [place] "6-clicks" Daily Activity Inpatient Short Form score of 6/24 indicates 100% ADL impairment this session.  Patient demonstrates guarded to fair rehab potential, and could benefit from continued skilled occupational therapy services while in acute care to maximize safety, independence and quality of life at home.  Continued occupational therapy services in a SNF setting prior to return home is recommended.  ?   Follow Up Recommendations  SNF;Supervision/Assistance - 24 hour    Equipment Recommendations   (Defer to post-acute recommendations.)    Recommendations for Other Services       Precautions / Restrictions Precautions Precautions: Fall Precaution Comments: incontinent B/B, check before mobilizing,  does not stand.      Mobility Bed Mobility Overal bed mobility: Needs Assistance Bed Mobility: Rolling;Supine to Sit;Sit to Supine Rolling: Total assist;+2 for physical assistance;+2 for safety/equipment   Supine to sit: Total assist;+2 for physical assistance;+2 for safety/equipment Sit to supine: Total assist;+2 for physical assistance;+2 for safety/equipment   General bed mobility comments: patient unable to asist in mobility. Assisted to sitting x ~ 8 minutes.    Transfers                 General transfer comment: requires a mechanical lift    Balance Overall balance assessment: Needs assistance Sitting-balance support: Single extremity supported;Feet supported Sitting balance-Leahy Scale: Zero Sitting balance - Comments: no balance reations, unable to support with 1 UE without max support of 1-2 people.                                   ADL either performed or assessed with clinical judgement   ADL Overall ADL's : Needs assistance/impaired;At baseline Eating/Feeding: Total assistance;Bed level Eating/Feeding Details (indicate cue type and reason): Pt unable to hold/grasp boost cup, or grooming items with either hand. Unable to bring hand to mouth Grooming: Total assistance;Sitting;Wash/dry face;Oral care Grooming Details (indicate cue type and reason): +2 people for safety due to poor sitting balance.  Total Assist for oral hygiene and washing face. Upper Body Bathing: Bed level;Total assistance   Lower Body Bathing: Total assistance;+2 for physical assistance;Bed level   Upper Body Dressing : Total assistance;Bed level   Lower Body Dressing: Total assistance;Bed level;+2 for physical assistance Lower Body  Dressing Details (indicate cue type and reason): Based on general assessment.   Toilet Transfer Details (indicate cue type and reason): Unable to assess.  Pt uses hoyer/mechanical lift at baseline. Toileting- Clothing Manipulation and Hygiene:  Total assistance;+2 for physical assistance;Bed level Toileting - Clothing Manipulation Details (indicate cue type and reason): Pt is incontinent of bowel and bladder. Pt stated that she usually cannot indicate if she has voided.  Total assist of 2 people needed to perform rolling and hygiene to pt after bowel movement at bed level.  Pt also on pure wick.     Functional mobility during ADLs: +2 for physical assistance;Total assistance       Vision   Vision Assessment?: No apparent visual deficits Additional Comments: Head tone to LT. Pt able to turn head to midline and will shift eyes to RT.  Pt did not indicate visual deficits.     Perception     Praxis      Pertinent Vitals/Pain Pain Assessment: Faces Faces Pain Scale: Hurts even more Pain Location: back, buttocks Pain Descriptors / Indicators: Grimacing;Moaning Pain Intervention(s): Limited activity within patient's tolerance;Monitored during session     Hand Dominance Right   Extremity/Trunk Assessment Upper Extremity Assessment Upper Extremity Assessment: RUE deficits/detail;LUE deficits/detail RUE Deficits / Details: mostly flaccid, no volitional movement noted except using hand to support self on EOB. Unable to grasp washcloth or toothbrush. LUE Deficits / Details: mostly flaccid, no volitional movement noted. Flexor tone noted to wrist.   Lower Extremity Assessment Lower Extremity Assessment: RLE deficits/detail;LLE deficits/detail RLE Deficits / Details: mostly flaccid, no volitional movement noted, appreciates LT LLE Deficits / Details: increased flexor tone/spasm noted, leg positioned in ER and flexed. appreciates LT   Cervical / Trunk Assessment Cervical / Trunk Assessment: Other exceptions Cervical / Trunk Exceptions: head rotated/tilted to left. able to turn to ~ midline. Trunkd lateral side bend to left when sitting   Communication Communication Communication: No difficulties   Cognition Arousal/Alertness:  Awake/alert Behavior During Therapy: WFL for tasks assessed/performed Overall Cognitive Status: Within Functional Limits for tasks assessed                                 General Comments: overall WFL for situation.  Responds well with step by step instructions.   General Comments       Exercises     Shoulder Instructions      Home Living Family/patient expects to be discharged to:: Skilled nursing facility                                 Additional Comments: from [place]      Prior Functioning/Environment Level of Independence: Needs assistance    ADL's / Homemaking Assistance Needed: Total care almost always.  Pt reports some moments of self-feeding with help.   Comments: patient requires total care, mechanical lift from bed. Pt cannot recall the last time that she stood up.        OT Problem List: Decreased strength;Decreased coordination;Pain;Increased edema;Decreased range of motion;Decreased activity tolerance;Impaired tone;Impaired balance (sitting and/or standing);Impaired UE functional use      OT Treatment/Interventions: Self-care/ADL training;Therapeutic exercise;Therapeutic activities;Neuromuscular education;Energy conservation;DME and/or AE instruction;Patient/family education;Balance training;Manual therapy    OT Goals(Current goals can be found in the care plan section) Acute Rehab OT Goals Patient Stated Goal: to get up, brush my teeth Time  For Goal Achievement: 07/24/20 Potential to Achieve Goals: Fair (Guarded) ADL Goals Pt Will Perform Eating: with max assist;bed level Pt Will Perform Grooming: with max assist;bed level Pt Will Perform Upper Body Bathing: with max assist;bed level Pt Will Perform Upper Body Dressing: with max assist;bed level Additional ADL Goal #1: Pt will tolerate P/AAROM ROM to BUEs to within 3/4 normal range to each joint within pain tolerance in order to increase UE overall function to participate  in ADLs.  OT Frequency: Min 1X/week   Barriers to D/C:            Co-evaluation              AM-PAC OT "6 Clicks" Daily Activity     Outcome Measure Help from another person eating meals?: Total Help from another person taking care of personal grooming?: Total Help from another person toileting, which includes using toliet, bedpan, or urinal?: Total Help from another person bathing (including washing, rinsing, drying)?: Total Help from another person to put on and taking off regular upper body clothing?: Total Help from another person to put on and taking off regular lower body clothing?: Total 6 Click Score: 6   End of Session Nurse Communication: Mobility status;Need for lift equipment  Activity Tolerance: Patient limited by pain;Patient tolerated treatment well;Treatment limited secondary to medical complications (Comment) Patient left: in bed  OT Visit Diagnosis: Pain;Hemiplegia and hemiparesis;Feeding difficulties (R63.3);Muscle weakness (generalized) (M62.81) Hemiplegia - caused by:  (MS) Pain - part of body:  (sacrum)                Time: 0340-3524 OT Time Calculation (min): 41 min Charges:  OT General Charges $OT Visit: 1 Visit OT Evaluation $OT Eval Moderate Complexity: 1 Mod OT Treatments $Self Care/Home Management : 8-22 mins  Anderson Malta, OT Acute Rehab Services Office: [phone] 07/10/2020  Julien Girt 07/10/2020, 2:41 PM

## 2020-07-10 NOTE — NC FL2 (Signed)
[place] LEVEL OF CARE SCREENING TOOL     IDENTIFICATION  Patient Name: Renee Huang Birthdate: 06/10/1956 Sex: female Admission Date (Current Location): 07/07/2020  [facility] and [place] Number:  Herbalist and Address:  [facility],  [place], [place]      Provider Number: [id number]  Attending Physician Name and Address:  Bonnielee Haff, MD  Relative Name and Phone Number:  [name] sister [phone]    Current Level of Care: Hospital Recommended Level of Care: [place] Prior Approval Number:    Date Approved/Denied:   PASRR Number: [id number]  Discharge Plan: ICF    Current Diagnoses: Patient Active Problem List   Diagnosis Date Noted  . Pressure injury of skin 07/08/2020  . Acute encephalopathy 07/07/2020  . Anemia 07/07/2020  . Acute lower UTI 07/07/2020  . Acute metabolic encephalopathy 91/79/1505  . Episodic paroxysmal anxiety disorder 12/20/2019  . Recurrent colitis due to Clostridium difficile 11/13/2019  . Sepsis secondary to UTI ([place]) 10/12/2019  . Clostridium difficile diarrhea 10/12/2019  . Fluid overload 10/12/2019  . Pleural effusion on left 10/12/2019  . Depression 10/12/2019  . Hypertension 07/29/2019  . Impaired mobility and activities of daily living 07/29/2019  . Neurogenic bladder 06/03/2018  . Hyperlipidemia 05/23/2013  . Irritable bowel syndrome 05/21/2011  . Multiple sclerosis, primary progressive ([place]) 05/21/2011    Orientation RESPIRATION BLADDER Height & Weight     Self, Time, Situation, Place  Normal Incontinent Weight: 61.2 kg Height:  5\' 5"  (165.1 cm)  BEHAVIORAL SYMPTOMS/MOOD NEUROLOGICAL BOWEL NUTRITION STATUS      Incontinent Diet (Regular)  AMBULATORY STATUS COMMUNICATION OF NEEDS Skin   Total Care (Paralysis) Verbally Other (Comment) (Sacrum Stage 4)                       Personal Care Assistance Level of Assistance  Bathing,  Feeding, Dressing, Total care Bathing Assistance: Maximum assistance Feeding assistance: Maximum assistance Dressing Assistance: Maximum assistance Total Care Assistance: Maximum assistance   Functional Limitations Info  Sight, Hearing, Speech Sight Info: Impaired Hearing Info: Adequate Speech Info: Adequate    SPECIAL CARE FACTORS FREQUENCY  PT (By licensed PT), OT (By licensed OT)     PT Frequency: Eval and Treat OT Frequency: Eval and Treat            Contractures Contractures Info: Not present    Additional Factors Info  Code Status, Allergies Code Status Info: FULL Allergies Info: No Known Allergies           Current Medications (07/10/2020):  This is the current hospital active medication list Current Facility-Administered Medications  Medication Dose Route Frequency Provider Last Rate Last Admin  . Ampicillin-Sulbactam (UNASYN) 3 g in sodium chloride 0.9 % 100 mL IVPB  3 g Intravenous Q6H Tommy Medal, Lavell Islam, MD 200 mL/hr at 07/10/20 1127 3 g at 07/10/20 1127  . ascorbic acid (VITAMIN C) tablet 500 mg  500 mg Oral BID Rise Patience, MD   500 mg at 07/10/20 0945  . baclofen (LIORESAL) tablet 10 mg  10 mg Oral QHS Bonnielee Haff, MD      . baclofen (LIORESAL) tablet 20 mg  20 mg Oral TID with meals Rise Patience, MD   20 mg at 07/10/20 1207  . Chlorhexidine Gluconate Cloth 2 % PADS 6 each  6 each Topical Daily Elodia Florence., MD   6 each at 07/10/20 [id number]  .  cholecalciferol (VITAMIN D) tablet 6,000 Units  6,000 Units Oral Daily Rise Patience, MD   6,000 Units at 07/10/20 0945  . collagenase (SANTYL) ointment   Topical BID Wellington Hampshire, PA-C   Given at 07/10/20 5361  . dicyclomine (BENTYL) tablet 20 mg  20 mg Oral TID AC Rise Patience, MD   20 mg at 07/10/20 1208  . enoxaparin (LOVENOX) injection 40 mg  40 mg Subcutaneous Q24H Rise Patience, MD   40 mg at 07/10/20 0946  . feeding supplement (PRO-STAT SUGAR FREE 64) liquid  30 mL  30 mL Oral BID Rise Patience, MD   30 mL at 07/10/20 0945  . ferrous sulfate tablet 325 mg  325 mg Oral Q breakfast Elodia Florence., MD   325 mg at 07/10/20 0815  . folic acid (FOLVITE) tablet 1 mg  1 mg Oral Daily Elodia Florence., MD   1 mg at 07/10/20 0945  . lip balm (CARMEX) ointment   Topical PRN Bonnielee Haff, MD      . loratadine (CLARITIN) tablet 10 mg  10 mg Oral Daily Rise Patience, MD   10 mg at 07/10/20 0945  . LORazepam (ATIVAN) tablet 0.5 mg  0.5 mg Oral Daily PRN Rise Patience, MD   0.5 mg at 07/10/20 0415  . mirtazapine (REMERON) tablet 7.5 mg  7.5 mg Oral QHS Rise Patience, MD   7.5 mg at 07/09/20 2240  . polyethylene glycol (MIRALAX / GLYCOLAX) packet 17 g  17 g Oral BID Bonnielee Haff, MD      . potassium chloride (KLOR-CON) CR tablet 10 mEq  10 mEq Oral Daily Rise Patience, MD   10 mEq at 07/10/20 0944  . pravastatin (PRAVACHOL) tablet 40 mg  40 mg Oral Daily Rise Patience, MD   40 mg at 07/10/20 0944  . senna (SENOKOT) tablet 17.2 mg  2 tablet Oral QHS Bonnielee Haff, MD      . sodium chloride flush (NS) 0.9 % injection 10-40 mL  10-40 mL Intracatheter PRN Bonnielee Haff, MD      . tamsulosin (FLOMAX) capsule 0.4 mg  0.4 mg Oral Daily Rise Patience, MD   0.4 mg at 07/10/20 0945  . vancomycin (VANCOCIN) 50 mg/mL oral solution 125 mg  125 mg Oral Q12H Tommy Medal, Lavell Islam, MD   125 mg at 07/10/20 0944  . vitamin B-12 (CYANOCOBALAMIN) tablet 1,000 mcg  1,000 mcg Oral Daily Rise Patience, MD   1,000 mcg at 07/10/20 0945     Discharge Medications: Please see discharge summary for a list of discharge medications.  Relevant Imaging Results:  Relevant Lab Results:   Additional Information WE#[id number]  Purcell Mouton, RN

## 2020-07-10 NOTE — TOC Progression Note (Signed)
[facility] - [facility] Note    Patient Details  Name: Renee Huang MRN: [id number] Date of Birth: [birth year]  [facility] CM/SW Contact  Purcell Mouton, RN Phone Number: 07/10/2020, 1:36 PM  Clinical Narrative:    Authorization was started with Fidela Salisbury EUM#[id number] for [facility]. Will fax clinicals to [phone].    Expected Discharge Plan: [place] Barriers to Discharge: No Barriers Identified  Expected Discharge Plan and Services Expected Discharge Plan: [place] arrangements for the past 2 months: [place]                                       Social Determinants of Health (SDOH) Interventions    Readmission Risk Interventions No flowsheet data found.

## 2020-07-10 NOTE — Progress Notes (Signed)
PHARMACY CONSULT NOTE FOR:  OUTPATIENT  PARENTERAL ANTIBIOTIC THERAPY (OPAT)  Indication: large decubitus ulcer with osteomyelitis Regimen: Unasyn 3g IV q6 End date: 08/05/20  IV antibiotic discharge orders are pended. To discharging provider:  please sign these orders via discharge navigator,  Select New Orders & click on the button choice - Manage This Unsigned Work.     Thank you for allowing pharmacy to be a part of this patient's care. Jimmy Footman, PharmD, BCPS, [place] [facility] Clinical Pharmacist Phone: [phone] 07/10/2020, 12:06 PM

## 2020-07-10 NOTE — Consult Note (Signed)
Consultation Note Date: 07/10/2020   Patient Name: Renee Huang  DOB: [birth year]  MRN: [id number]  Age / Sex: 64 y.o., female  PCP: [name], DO Referring Physician: Bonnielee Haff, MD  Reason for Consultation: Symptom management  HPI/Patient Profile: 64 y.o. female  with past medical history of multiple sclerosis, IBS, neurogenic bladder admitted on 07/07/2020 with lethargy from SNF and found to have UTI and stage 4 sacral decubitus ulcer with evidence of osteomyelitis followed by ID. No need for acute surgical debridement per surgery.   Clinical Assessment and Goals of Care: I met today with Jameca after having update from RN. Leisl is very sleepy today. She awakens during my visit but remains very sleepy with eyes closed. She responds with appropriate verbal responses. Eilene reports that she has no pain now but severe deep, throbbing pain with any movement or dressing changes/clean ups. She reports that nothing so far has given her much relief. She also complaints of being too sleepy during the day and poor intake.   I attempted to discuss with Terrian the severity of her sacral wound and concern with infection into bone. I explained that I am concerned because this is a very difficult infection to treat and can lead to further health decline and complications. Dimitria did not want to think or discuss this. She just wants to rest.   All questions/concerns addressed. Emotional support provided.   Primary Decision Maker PATIENT    Code Status/Advance Care Planning:  Full code - did not discuss   Symptom Management:   Deep/throbbing pain without radiation in sacral wound: Concern for lethargy. Will be conservative and add tramadol 25 mg every 8 hours as needed. Please give prior to dressing changes. Consider completing dressing change in evening so if needing increased pain medication she may be able  to be more alert in daytime. Also adding Tylenol 1000 TID.   Palliative Prophylaxis:   Aspiration, Bowel Regimen, Delirium Protocol, Frequent Pain Assessment and Turn Reposition  Additional Recommendations (Limitations, Scope, Preferences):  Full Scope Treatment  Prognosis:   Overall prognosis poor with underlying multiple sclerosis and stage 3 sacral decubitus with osteomyelitis.   Discharge Planning: To Be Determined      Primary Diagnoses: Present on Admission: . Acute encephalopathy . Hypertension . Multiple sclerosis, primary progressive ([place]) . Anemia . Acute lower UTI . Recurrent colitis due to Clostridium difficile . Acute metabolic encephalopathy   I have reviewed the medical record, interviewed the patient and family, and examined the patient. The following aspects are pertinent.  Past Medical History:  Diagnosis Date  . Actinic keratosis   . BCC (basal cell carcinoma of skin)   . Closed fracture of second metatarsal bone of right foot   . Closed fracture of third metatarsal bone of right foot with routine healing   . IBS (irritable bowel syndrome)   . Multiple sclerosis ([place])   . Neurogenic bladder   . Rectal polyp    Tubular adenoma  . Recurrent UTI   . SCC (  squamous cell carcinoma)    Social History   Socioeconomic History  . Marital status: Divorced    Spouse name: Not on file  . Number of children: Not on file  . Years of education: Not on file  . Highest education level: Not on file  Occupational History  . Not on file  Tobacco Use  . Smoking status: Never Smoker  . Smokeless tobacco: Never Used  Vaping Use  . Vaping Use: Never used  Substance and Sexual Activity  . Alcohol use: Yes    Alcohol/week: 18.0 standard drinks    Types: 2 Glasses of wine, 16 Standard drinks or equivalent per week  . Drug use: Never  . Sexual activity: Not on file  Other Topics Concern  . Not on file  Social History Narrative   Divorced.  No children.  Member of [employer] and has a good support system there. Never smoker. 16.7 standard drinks - two 5 oz glasses of wine per week.  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    Social Determinants of Health   Financial Resource Strain:   . Difficulty of Paying Living Expenses: Not on file  Food Insecurity:   . Worried About Charity fundraiser in the Last Year: Not on file  . Ran Out of Food in the Last Year: Not on file  Transportation Needs:   . Lack of Transportation (Medical): Not on file  . Lack of Transportation (Non-Medical): Not on file  Physical Activity:   . Days of Exercise per Week: Not on file  . Minutes of Exercise per Session: Not on file  Stress:   . Feeling of Stress : Not on file  Social Connections:   . Frequency of Communication with Friends and Family: Not on file  . Frequency of Social Gatherings with Friends and Family: Not on file  . Attends Religious Services: Not on file  . Active Member of Clubs or Organizations: Not on file  . Attends Archivist Meetings: Not on file  . Marital Status: Not on file   Family History  Problem Relation Age of Onset  . Hypertension Mother   . Hyperlipidemia Mother   . Breast cancer Mother   . Hypertension Father   . Heart attack Father   . Hypertension Brother   . Diabetes Brother   . Lung cancer Maternal Grandmother   . Heart attack Paternal Grandfather    Scheduled Meds: . vitamin C  500 mg Oral BID  . baclofen  20 mg Oral TID with meals  . baclofen  30 mg Oral QHS  . Chlorhexidine Gluconate Cloth  6 each Topical Daily  . cholecalciferol  6,000 Units Oral Daily  . collagenase   Topical BID  . dicyclomine  20 mg Oral TID AC  . enoxaparin (LOVENOX) injection  40 mg Subcutaneous Q24H  . feeding supplement (PRO-STAT SUGAR FREE 64)  30 mL Oral BID  . ferrous sulfate  325 mg Oral Q breakfast  . folic acid  1 mg Oral Daily  .  loratadine  10 mg Oral Daily  . mirtazapine  7.5 mg Oral QHS  . polyethylene glycol  17 g Oral BID  . potassium chloride  10 mEq Oral Daily  . potassium chloride  40 mEq Oral Once  . pravastatin  40 mg Oral Daily  . senna  2 tablet Oral QHS  . tamsulosin  0.4 mg  Oral Daily  . tiZANidine  4 mg Oral QHS  . vancomycin  125 mg Oral Q12H  . vitamin B-12  1,000 mcg Oral Daily   Continuous Infusions: . ampicillin-sulbactam (UNASYN) IV 3 g (07/10/20 0421)   PRN Meds:.lip balm, LORazepam, sodium chloride flush No Known Allergies Review of Systems  Constitutional: Positive for activity change, appetite change and fatigue.  Neurological: Positive for weakness.    Physical Exam Vitals and nursing note reviewed.  Constitutional:      General: She is not in acute distress.    Appearance: She is ill-appearing.  Cardiovascular:     Rate and Rhythm: Normal rate.  Pulmonary:     Effort: Pulmonary effort is normal. No tachypnea, accessory muscle usage or respiratory distress.  Abdominal:     General: Abdomen is flat.  Neurological:     Mental Status: She is oriented to person, place, and time. She is lethargic.     Vital Signs: BP 110/61 (BP Location: Left Arm)   Pulse 68   Temp 98 F (36.7 C) (Oral)   Resp 20   Ht _0  (1.651 m)   Wt 61.2 kg   SpO2 98%   BMI 22.45 kg/m  Pain Scale: 0-10 POSS *See Group Information*: S-Acceptable,Sleep, easy to arouse Pain Score: 0-No pain   SpO2: SpO2: 98 % O2 Device:SpO2: 98 % O2 Flow Rate: .   IO: Intake/output summary:   Intake/Output Summary (Last 24 hours) at 07/10/2020 0745 Last data filed at 07/09/2020 1911 Gross per 24 hour  Intake 970 ml  Output 800 ml  Net 170 ml    LBM: Last BM Date: 07/09/20 Baseline Weight: Weight: 61.2 kg Most recent weight: Weight: 61.2 kg     Palliative Assessment/Data:     Time In: 1500 Time Out: 1530 Time Total: 30 min Greater than 50%  of this time was spent counseling and coordinating  care related to the above assessment and plan.  Signed by: Vinie Sill, NP Palliative Medicine Team Pager # [phone] (M-F 8a-5p) Team Phone # [phone] (Nights/Weekends)

## 2020-07-10 NOTE — TOC Progression Note (Signed)
Transition of Care [facility]) - Progression Note    Patient Details  Name: Renee Huang MRN: [id number] Date of Birth: 15-Jan-1956  Transition of Care [facility]) CM/SW Contact  Purcell Mouton, RN Phone Number: 07/10/2020, 11:29 AM  Clinical Narrative:    Pt is from [facility] long term and will return.    Expected Discharge Plan: [place] Barriers to Discharge: No Barriers Identified  Expected Discharge Plan and Services Expected Discharge Plan: [place] arrangements for the past 2 months: [place]                                       Social Determinants of Health (SDOH) Interventions    Readmission Risk Interventions No flowsheet data found.

## 2020-07-11 DIAGNOSIS — E876 Hypokalemia: Secondary | ICD-10-CM

## 2020-07-11 DIAGNOSIS — G9341 Metabolic encephalopathy: Secondary | ICD-10-CM | POA: Diagnosis not present

## 2020-07-11 DIAGNOSIS — I10 Essential (primary) hypertension: Secondary | ICD-10-CM | POA: Diagnosis not present

## 2020-07-11 DIAGNOSIS — M8618 Other acute osteomyelitis, other site: Secondary | ICD-10-CM

## 2020-07-11 DIAGNOSIS — K5900 Constipation, unspecified: Secondary | ICD-10-CM

## 2020-07-11 DIAGNOSIS — G35 Multiple sclerosis: Secondary | ICD-10-CM | POA: Diagnosis not present

## 2020-07-11 DIAGNOSIS — N390 Urinary tract infection, site not specified: Secondary | ICD-10-CM | POA: Diagnosis not present

## 2020-07-11 DIAGNOSIS — M4628 Osteomyelitis of vertebra, sacral and sacrococcygeal region: Secondary | ICD-10-CM

## 2020-07-11 DIAGNOSIS — D649 Anemia, unspecified: Secondary | ICD-10-CM

## 2020-07-11 LAB — COMPREHENSIVE METABOLIC PANEL
ALT: 7 U/L (ref 0–44)
AST: 10 U/L — ABNORMAL LOW (ref 15–41)
Albumin: 1.6 g/dL — ABNORMAL LOW (ref 3.5–5.0)
Alkaline Phosphatase: 56 U/L (ref 38–126)
Anion gap: 6 (ref 5–15)
BUN: 6 mg/dL — ABNORMAL LOW (ref 8–23)
CO2: 27 mmol/L (ref 22–32)
Calcium: 8.1 mg/dL — ABNORMAL LOW (ref 8.9–10.3)
Chloride: 111 mmol/L (ref 98–111)
Creatinine, Ser: 0.5 mg/dL (ref 0.44–1.00)
GFR, Estimated: >60 mL/min (ref >60)
Glucose, Bld: 79 mg/dL (ref 70–99)
Potassium: 3.6 mmol/L (ref 3.5–5.1)
Sodium: 144 mmol/L (ref 135–145)
Total Bilirubin: 0.5 mg/dL (ref 0.3–1.2)
Total Protein: 4.1 g/dL — ABNORMAL LOW (ref 6.5–8.1)

## 2020-07-11 LAB — CBC
HCT: 25.9 % — ABNORMAL LOW (ref 36.0–46.0)
Hemoglobin: 7.6 g/dL — ABNORMAL LOW (ref 12.0–15.0)
MCH: 25.5 pg — ABNORMAL LOW (ref 26.0–34.0)
MCHC: 29.3 g/dL — ABNORMAL LOW (ref 30.0–36.0)
MCV: 86.9 fL (ref 80.0–100.0)
Platelets: 402 10*3/uL — ABNORMAL HIGH (ref 150–400)
RBC: 2.98 MIL/uL — ABNORMAL LOW (ref 3.87–5.11)
RDW: 18.6 % — ABNORMAL HIGH (ref 11.5–15.5)
WBC: 7.2 10*3/uL (ref 4.0–10.5)
nRBC: 0 % (ref 0.0–0.2)

## 2020-07-11 MED ORDER — POLYETHYLENE GLYCOL 3350 17 G PO PACK: 17.0000 g | PACK | Freq: Every day | ORAL | Status: DC | PRN

## 2020-07-11 MED ORDER — POTASSIUM CHLORIDE CRYS ER 20 MEQ PO TBCR
40.0000 meq | EXTENDED_RELEASE_TABLET | Freq: Once | ORAL | Status: AC
  Administered 2020-07-11: 40 meq via ORAL
  Filled 2020-07-11: qty 2

## 2020-07-11 NOTE — Progress Notes (Addendum)
PROGRESS NOTE    Renee Huang  NTI:[id number] DOB: [birth year] DOA: 07/07/2020 PCP: [name], DO    Chief Complaint  Patient presents with  . Altered Mental Status    Brief Narrative:  [name] a 64 y.o.femalewithhistory of primary progressive multiple sclerosis presently not on any disease modifying medication was found to be lethargic at the facility and was brought to the ER. 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 Patient had worsening back pain and as per the report was given some hydrocodone following which patient became more lethargic. It was told that patient was hypotensive.  ED Course:In the ER patient is mildly lethargic but answering questions appropriately and he is giving good history. UA is concerning for UTI. Patient is maintaining his sats. Patient states she took some hydrocodone after she had some worsening back pain yesterday following which she became more confused. Patient denies any headache nausea vomiting or abdominal pain. In the ER patient had one episode of loose stools. Labs showing worsening anemia usually hemoglobin is around 9 at baseline is around 7 at this time. Stool did not look melanotic in the ER as per the ER physician. Patient was started on ceftriaxone for UTI admitted for lethargy/acute encephalopathy could be related to pain medication although could be secondary to UTI. Covid test was negative.   Assessment & Plan:   Principal Problem:   Acute encephalopathy Active Problems:   Hypertension   Multiple sclerosis, primary progressive (HCC)   Recurrent colitis due to Clostridium difficile   Anemia   Acute lower UTI   Acute metabolic encephalopathy   Pressure injury of skin   Sacral decubitus ulcer, stage IV (HCC)   Goals of care, counseling/discussion   Pain associated with wound   Palliative care by specialist  1 acute metabolic encephalopathy Felt secondary to  administration of hydrocodone which was given at the nursing facility secondary to back pain.  Patient noted to be more lethargic after receiving pain medication.  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  Ammonia level within normal limits.  Vitamin B12 was normal.  HIV nonreactive.  Patient's Norco and tizanidine currently on hold.  Baclofen was continued however dose decreased yesterday due to concerns for lethargy.  Tizanidine has been discontinued. Patient improved clinically.  Alert and oriented to self place and time.  Stated night when she was talking about like she could not get out of has improved.  Continue current pain regimen with Ultram as needed as well as scheduled Tylenol per palliative care recommendations.  Follow.  2.  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  CT findings new compared to prior CT done in February.  Patient seen in consultation by general surgery no plans for surgical debridement of sacral decubitus at this time.  Patient seen by wound care nurse continue current wound care recommendations.  Patient noted to have been on IV antibiotics in the outpatient setting prior to presentation to the ED.  Patient already with PICC line in place and Unasyn ordered x4 weeks per ID recommendations.  Procalcitonin of 0.27, CRP at 0.7.  WBC elevated at 18.1 on presentation but seems to have normalized.  Continue current IV antibiotics.  Follow.  3.  Abnormal right common femoral vein Possible filling  defect noted on CT.  Lower extremity Dopplers done negative for DVT.  No further work-up needed at this time.  Outpatient follow-up.  4.  Severe bilateral hydroureteronephrosis Felt secondary to chronic bladder outlet obstruction.   Noted on prior CT scans.  Patient with urine output.  Diffuse bladder wall thickening also noted concerning for cystitis.  Continue Flomax.  Patient on IV antibiotics secondary to decubitus ulcer.  5.  Abnormal urine culture with Pseudomonas thought to be colonization.   Urine cultures done positive for Pseudomonas.  Dr. Maryland Pink reviewed care everywhere and patient noted to have numerous positive urine cultures over the last year felt likely patient colonized.  Dr. Maryland Pink discussed with ID and felt no need to treat this specifically considering patient's history of recurrent C. difficile.  Follow.  6.  Recurrent C. difficile colitis Continue enteric precautions.  Continue oral vancomycin.  On an extended course of oral vancomycin while on antibiotics.  VAC has been changed per ID to twice daily.  Outpatient follow-up.  7.  Normocytic anemia/folate deficiency/iron deficiency H&H stable.  Transfusion threshold hemoglobin < 7.  8.  Hypokalemia Potassium at 3.6.  We will give an extra dose of K. Dur 40 mEq p.o. x1.  Follow.  9.  Hypoalbuminemia Secondary to poor nutrition.  Nutritional supplementation.  10.  Hyperlipidemia Statin.  11.  Primary progressive multiple sclerosis Outpatient follow-up.  12.  Constipation Large amount of stool noted throughout the colon on CT scan.  Patient started on a bowel regimen have been having multiple bowel movements and as such we will change MiraLAX to as needed.  Follow.  13.  Trace bilateral pleural effusions Currently asymptomatic.  Follow.  14.  Pressure Injury 07/07/20 Sacrum Medial Stage 4 - Full thickness tissue loss with exposed bone, tendon or muscle. (Active)  07/07/20 0840  Location: Sacrum  Location Orientation: Medial  Staging: Stage 4 - Full thickness tissue loss with exposed bone, tendon or muscle.  Wound Description (Comments):   Present on Admission:      Pressure Injury 07/07/20 Back Left;Upper Stage 1 -  Intact skin with  non-blanchable redness of a localized area usually over a bony prominence. (Active)  07/07/20 0840  Location: Back  Location Orientation: Left;Upper  Staging: Stage 1 -  Intact skin with non-blanchable redness of a localized area usually over a bony prominence.  Wound Description (Comments):   Present on Admission: Yes     Pressure Injury 07/07/20 Heel Right Stage 1 -  Intact skin with non-blanchable redness of a localized area usually over a bony prominence. (Active)  07/07/20 0840  Location: Heel  Location Orientation: Right  Staging: Stage 1 -  Intact skin with non-blanchable redness of a localized area usually over a bony prominence.  Wound Description (Comments):   Present on Admission: Yes         DVT prophylaxis: Lovenox Code Status: Full Family Communication: Updated patient.  No family at bedside. Disposition:   Status is: Inpatient    Dispo:  Patient From: [place]  Planned Disposition: [place]  Expected discharge date: 07/13/20  Medically stable for discharge: No        Consultants:   Palliative care: Dr. Rowe Pavy 07/10/2020  ID: Dr.Van Dam 07/09/2020  General surgery: Dr. Johney Maine 07/09/2020  Phineas Douglas, RN 07/07/2020  Procedures:   CT abdomen and pelvis 07/08/2020  Chest x-ray 07/07/2020  Lower extremity Dopplers 07/09/2020    Antimicrobials:  Oral vancomycin 07/07/2020>>>>  IV Unasyn 07/09/2020>>>  IV Rocephin 07/07/2020>>> 07/09/2020   Subjective: Laying in bed.  Alert.  Following commands.  Feels better than she did yesterday in terms of confusion.  Denies any chest pain or shortness of breath.  Per RN patient with bowel movement and refusing MiraLAX at this time.  Objective: Vitals:   07/10/20 0639 07/10/20 1226 07/10/20 2011 07/11/20 0538  BP: 110/61 (!) 134/57 (!) 121/59 (!) 127/58  Pulse: 68 71 77 69  Resp: 20 (!) 23 20 19   Temp: 98 F (36.7 C) (!) 97.3 F (36.3 C) 97.8 F (36.6 C) 98.3  F (36.8 C)  TempSrc: Oral Oral Oral Oral  SpO2: 98% 99% 98% 100%  Weight:      Height:        Intake/Output Summary (Last 24 hours) at 07/11/2020 1314 Last data filed at 07/11/2020 1103 Gross per 24 hour  Intake 1350 ml  Output 1400 ml  Net -50 ml   Filed Weights   07/07/20 0112  Weight: 61.2 kg    Examination:  General exam: Appears calm and comfortable  Respiratory system: Clear to auscultation anterior lung fields. Respiratory effort normal. Cardiovascular system: S1 & S2 heard, RRR. No JVD, murmurs, rubs, gallops or clicks. No pedal edema. Gastrointestinal system: Abdomen is nondistended, soft and nontender. No organomegaly or masses felt. Normal bowel sounds heard. Central nervous system: Alert and oriented. No focal neurological deficits. Extremities: Bilateral lower extremity weakness chronic.  Symmetric 5 x 5 power. Skin: No rashes, lesions or ulcers Psychiatry: Judgement and insight appear normal. Mood & affect appropriate.     Data Reviewed: I have personally reviewed following labs and imaging studies  CBC: Recent Labs  Lab 07/07/20 0102 07/07/20 0102 07/07/20 0605 07/08/20 0915 07/09/20 0410 07/10/20 0350 07/11/20 0408  WBC 14.6*   < > 18.1* 12.3* 11.4* 9.6 7.2  NEUTROABS 11.6*  --  14.3* 9.8*  --   --   --   HGB 7.8*   < > 8.9* 8.1* 8.0* 7.9* 7.6*  HCT 26.4*   < > 29.5* 27.5* 26.3* 26.7* 25.9*  MCV 85.7   < > 85.3 84.9 85.4 85.3 86.9  PLT 465*   < > 630* 517* 483* 416* 402*   < > = values in this interval not displayed.    Basic Metabolic Panel: Recent Labs  Lab 07/07/20 0605 07/08/20 0915 07/09/20 0410 07/10/20 0350 07/11/20 0408  NA 141 140 141 142 144  K 3.6 3.2* 3.3* 3.2* 3.6  CL 105 109 108 110 111  CO2 26 28 24 24 27   GLUCOSE 73 80 70 129* 79  BUN 15 10 10 10  6*  CREATININE 0.58 0.50 0.48 0.66 0.50  CALCIUM 8.1* 7.9* 7.9* 7.8* 8.1*  MG  --  1.7  --  2.0  --   PHOS  --  2.4*  --   --   --     GFR: Estimated Creatinine  Clearance: 63.9 mL/min (by C-G formula based on SCr of 0.5 mg/dL).  Liver Function Tests: Recent Labs  Lab 07/07/20 0605 07/08/20 0915 07/09/20 0410 07/10/20 0350 07/11/20 0408  AST 13* 12* 11* 12* 10*  ALT 7 7 8 9 7   ALKPHOS 84 71 68 65 56  BILITOT 0.5 0.2* 0.4 0.4 0.5  PROT 5.1* 4.7* 4.6* 4.6* 4.1*  ALBUMIN 1.8* 1.6* 1.7* 1.7* 1.6*    CBG: No results for input(s): GLUCAP in the last 168 hours.   Recent Results (from the past 240 hour(s))  Blood culture (routine  single)     Status: None (Preliminary result)   Collection Time: 07/07/20  1:02 AM   Specimen: BLOOD  Result Value Ref Range Status   Specimen Description   Final    BLOOD RIGHT WRIST Performed at [facility] [address] [place], [place] [address]    Special Requests   Final    BOTTLES DRAWN AEROBIC AND ANAEROBIC Blood Culture adequate volume Performed at [facility] [address], [place], [place] [address]    Culture   Final    NO GROWTH 4 DAYS Performed at [facility], [place] [address], [place], [place] [address]    Report Status PENDING  Incomplete  Urine culture     Status: Abnormal   Collection Time: 07/07/20  1:02 AM   Specimen: In/Out Cath Urine  Result Value Ref Range Status   Specimen Description   Final    IN/OUT CATH URINE Performed at [facility] [address], [place], [place] [address]    Special Requests   Final    NONE Performed at [facility], [place] [address] [place], [place] [address]    Culture >=100,000 COLONIES/mL PSEUDOMONAS AERUGINOSA (A)  Final   Report Status 07/10/2020 FINAL  Final   Organism ID, Bacteria PSEUDOMONAS AERUGINOSA (A)  Final      Susceptibility   Pseudomonas aeruginosa - MIC*    CEFTAZIDIME 16 INTERMEDIATE Intermediate     CIPROFLOXACIN 2 INTERMEDIATE Intermediate     GENTAMICIN 8 INTERMEDIATE Intermediate     IMIPENEM 2 SENSITIVE Sensitive     PIP/TAZO 16  SENSITIVE Sensitive     CEFEPIME 16 RESISTANT Resistant     * >=100,000 COLONIES/mL PSEUDOMONAS AERUGINOSA  Resp Panel by RT-PCR (Flu A&B, Covid) Nasopharyngeal Swab     Status: None   Collection Time: 07/07/20  5:17 AM   Specimen: Nasopharyngeal Swab; Nasopharyngeal(NP) swabs in vial transport medium  Result Value Ref Range Status   SARS Coronavirus 2 by RT PCR NEGATIVE NEGATIVE Final    Comment: (NOTE) SARS-CoV-2 target nucleic acids are NOT DETECTED.  The SARS-CoV-2 RNA is generally detectable in upper respiratory specimens during the acute phase of infection. The lowest concentration of SARS-CoV-2 viral copies this assay can detect is 138 copies/mL. A negative result does not preclude SARS-Cov-2 infection and should not be used as the sole basis for treatment or other patient management decisions. A negative result may occur with  improper specimen collection/handling, submission of specimen other than nasopharyngeal swab, presence of viral mutation(s) within the areas targeted by this assay, and inadequate number of viral copies(<138 copies/mL). A negative result must be combined with clinical observations, patient history, and epidemiological information. The expected result is Negative.  Fact Sheet for Patients:  EntrepreneurPulse.com.au  Fact Sheet for Healthcare Providers:  IncredibleEmployment.be  This test is no t yet approved or cleared by the [place] FDA and  has been authorized for detection and/or diagnosis of SARS-CoV-2 by FDA under an Emergency Use Authorization (EUA). This EUA will remain  in effect (meaning this test can be used) for the duration of the COVID-19 declaration under Section 564(b)(1) of the Act, 21 U.S.C.section 360bbb-3(b)(1), unless the authorization is terminated  or revoked sooner.       Influenza A by PCR NEGATIVE NEGATIVE Final   Influenza B by PCR NEGATIVE NEGATIVE Final    Comment: (NOTE) The  Xpert Xpress SARS-CoV-2/FLU/RSV plus assay is intended as an aid in the  diagnosis of influenza from Nasopharyngeal swab specimens and should not be used as a sole basis for treatment. Nasal washings and aspirates are unacceptable for Xpert Xpress SARS-CoV-2/FLU/RSV testing.  Fact Sheet for Patients: EntrepreneurPulse.com.au  Fact Sheet for Healthcare Providers: IncredibleEmployment.be  This test is not yet approved or cleared by the [place] FDA and has been authorized for detection and/or diagnosis of SARS-CoV-2 by FDA under an Emergency Use Authorization (EUA). This EUA will remain in effect (meaning this test can be used) for the duration of the COVID-19 declaration under Section 564(b)(1) of the Act, 21 U.S.C. section 360bbb-3(b)(1), unless the authorization is terminated or revoked.  Performed at [facility], [place] [address], [place], [place] [address]          Radiology Studies: No results found.      Scheduled Meds: . acetaminophen  1,000 mg Oral TID  . vitamin C  500 mg Oral BID  . baclofen  10 mg Oral QHS  . baclofen  20 mg Oral TID with meals  . Chlorhexidine Gluconate Cloth  6 each Topical Daily  . cholecalciferol  6,000 Units Oral Daily  . collagenase   Topical BID  . dicyclomine  20 mg Oral TID AC  . enoxaparin (LOVENOX) injection  40 mg Subcutaneous Q24H  . feeding supplement (PRO-STAT SUGAR FREE 64)  30 mL Oral BID  . ferrous sulfate  325 mg Oral Q breakfast  . folic acid  1 mg Oral Daily  . loratadine  10 mg Oral Daily  . mirtazapine  7.5 mg Oral QHS  . polyethylene glycol  17 g Oral BID  . potassium chloride  10 mEq Oral Daily  . pravastatin  40 mg Oral Daily  . senna  2 tablet Oral QHS  . tamsulosin  0.4 mg Oral Daily  . vancomycin  125 mg Oral Q12H  . vitamin B-12  1,000 mcg Oral Daily   Continuous Infusions: . ampicillin-sulbactam (UNASYN) IV 3 g (07/11/20 1103)     LOS: 4  days    Time spent: 35 minutes    Irine Seal, MD [facility]   To contact the attending provider between 7A-7P or the covering provider during after hours 7P-7A, please log into the web site [email or web address] and access using universal [place] password for that web site. If you do not have the password, please call the hospital operator.  07/11/2020, 1:14 PM

## 2020-07-11 NOTE — TOC Progression Note (Addendum)
Transition of Care [facility]) - Progression Note    Patient Details  Name: Renee Huang MRN: [id number] Date of Birth: Mar 25, 1956  [facility] CM/SW Contact  Leeroy Cha, RN Phone Number: 07/11/2020, 8:59 AM  Clinical Narrative:     Renee Huang is a 64 y.o. female with history of primary progressive multiple sclerosis presently not on any disease modifying medication was found to be lethargic at the facility and was brought to the ER.  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  Patient had worsening back pain and as per the report was given some hydrocodone following which patient became more lethargic.  It was told that patient was hypotensive.  Patient has stage 4 sacral pressure  wound.  Clinical Impression  Patient alert , able to give some history as to motor and mobility at facility. Patient requires total assistance for rolling and sitting on bed edge x ~8 minutes, requiring support, tendency to lean to left, did  Self support with RUE at times. . Patient incontinent of BM, assisted with rolling and getting cleaned up.  Pt admitted with above diagnosis.    Plan is for snf placement will start process.   [id number]/requested additional ot and pt notes faxed to [facility]. [facility] due to possibility of long term care sending it to the medical director for review. Expected Discharge Plan: [place] Barriers to Discharge: No Barriers Identified  Expected Discharge Plan and Services Expected Discharge Plan: [place] arrangements for the past 2 months: [place]                                       Social Determinants of Health (SDOH) Interventions    Readmission Risk Interventions No flowsheet data found.

## 2020-07-12 DIAGNOSIS — G9341 Metabolic encephalopathy: Secondary | ICD-10-CM | POA: Diagnosis not present

## 2020-07-12 DIAGNOSIS — N390 Urinary tract infection, site not specified: Secondary | ICD-10-CM | POA: Diagnosis not present

## 2020-07-12 DIAGNOSIS — G35 Multiple sclerosis: Secondary | ICD-10-CM | POA: Diagnosis not present

## 2020-07-12 DIAGNOSIS — I10 Essential (primary) hypertension: Secondary | ICD-10-CM | POA: Diagnosis not present

## 2020-07-12 LAB — CBC
HCT: 27.2 % — ABNORMAL LOW (ref 36.0–46.0)
Hemoglobin: 8.1 g/dL — ABNORMAL LOW (ref 12.0–15.0)
MCH: 25.5 pg — ABNORMAL LOW (ref 26.0–34.0)
MCHC: 29.8 g/dL — ABNORMAL LOW (ref 30.0–36.0)
MCV: 85.5 fL (ref 80.0–100.0)
Platelets: 418 10*3/uL — ABNORMAL HIGH (ref 150–400)
RBC: 3.18 MIL/uL — ABNORMAL LOW (ref 3.87–5.11)
RDW: 18.8 % — ABNORMAL HIGH (ref 11.5–15.5)
WBC: 8.3 10*3/uL (ref 4.0–10.5)
nRBC: 0 % (ref 0.0–0.2)

## 2020-07-12 LAB — COMPREHENSIVE METABOLIC PANEL
ALT: 9 U/L (ref 0–44)
AST: 14 U/L — ABNORMAL LOW (ref 15–41)
Albumin: 1.7 g/dL — ABNORMAL LOW (ref 3.5–5.0)
Alkaline Phosphatase: 64 U/L (ref 38–126)
Anion gap: 7 (ref 5–15)
BUN: 6 mg/dL — ABNORMAL LOW (ref 8–23)
CO2: 26 mmol/L (ref 22–32)
Calcium: 8.2 mg/dL — ABNORMAL LOW (ref 8.9–10.3)
Chloride: 111 mmol/L (ref 98–111)
Creatinine, Ser: 0.6 mg/dL (ref 0.44–1.00)
GFR, Estimated: >60 mL/min (ref >60)
Glucose, Bld: 84 mg/dL (ref 70–99)
Potassium: 3.8 mmol/L (ref 3.5–5.1)
Sodium: 144 mmol/L (ref 135–145)
Total Bilirubin: 0.4 mg/dL (ref 0.3–1.2)
Total Protein: 4.6 g/dL — ABNORMAL LOW (ref 6.5–8.1)

## 2020-07-12 LAB — CULTURE, BLOOD (SINGLE)
Culture: NO GROWTH
Special Requests: ADEQUATE

## 2020-07-12 LAB — MAGNESIUM: Magnesium: 2.1 mg/dL (ref 1.7–2.4)

## 2020-07-12 NOTE — Progress Notes (Signed)
PROGRESS NOTE    Renee Huang  ELF:[id number] DOB: [birth year] DOA: 07/07/2020 PCP: [name], DO    Chief Complaint  Patient presents with   Altered Mental Status    Brief Narrative:  Renee Huang a 64 y.o.femalewithhistory of primary progressive multiple sclerosis presently not on any disease modifying medication was found to be lethargic at the facility and was brought to the ER. 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 Patient had worsening back pain and as per the report was given some hydrocodone following which patient became more lethargic. It was told that patient was hypotensive.  ED Course:In the ER patient is mildly lethargic but answering questions appropriately and he is giving good history. UA is concerning for UTI. Patient is maintaining his sats. Patient states she took some hydrocodone after she had some worsening back pain yesterday following which she became more confused. Patient denies any headache nausea vomiting or abdominal pain. In the ER patient had one episode of loose stools. Labs showing worsening anemia usually hemoglobin is around 9 at baseline is around 7 at this time. Stool did not look melanotic in the ER as per the ER physician. Patient was started on ceftriaxone for UTI admitted for lethargy/acute encephalopathy could be related to pain medication although could be secondary to UTI. Covid test was negative.   Assessment & Plan:   Principal Problem:   Acute encephalopathy Active Problems:   Hypertension   Multiple sclerosis, primary progressive (HCC)   Recurrent colitis due to Clostridium difficile   Anemia   Acute lower UTI   Acute metabolic encephalopathy   Pressure injury of skin   Sacral decubitus ulcer, stage IV (HCC)   Goals of care, counseling/discussion   Pain associated with wound   Palliative care by specialist   Normocytic anemia   Acute osteomyelitis of coccyx  ([place])   Constipation   Hypokalemia  1 acute metabolic encephalopathy Felt secondary to administration of hydrocodone which was given at the nursing facility secondary to back pain.  Patient noted to be more lethargic after receiving pain medication.  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  Ammonia level within normal limits.  Vitamin B12 was normal.  HIV nonreactive.  Patient's Norco and tizanidine currently on hold.  Baclofen was continued however dose decreased due to concerns for lethargy.  Tizanidine has been discontinued. Patient with clinical improvement.  Alert and oriented to self place and time.  Continue current pain regimen with Ultram as needed as well as scheduled Tylenol per palliative care recommendations.  Appreciate palliative care input.  2.  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  CT findings new compared to prior CT done in February.  Patient seen in consultation by general surgery no plans for surgical debridement of sacral decubitus at this time.  Patient seen by wound care nurse continue current wound care recommendations.  Patient noted to have been on IV antibiotics in the outpatient setting prior to presentation to the ED.  Patient already with PICC line in place and Unasyn ordered x4 weeks per ID recommendations.  Procalcitonin of 0.27, CRP at 0.7.  WBC elevated at 18.1 on presentation but has normalized.  Continue IV Unasyn.  Follow.   3.  Abnormal right common femoral vein Possible  filling defect noted on CT.  Lower extremity Dopplers done negative for DVT.  No further work-up needed at this time.  Outpatient follow-up.  4.  Severe bilateral hydroureteronephrosis Felt secondary to chronic bladder outlet  obstruction.  Noted on prior CT scans.  Patient with total of 700 cc over the past 24 hours.  Diffuse bladder wall thickening also noted concerning for cystitis.  Continue Flomax.  Patient on IV antibiotics secondary to decubitus ulcer.  5.  Abnormal urine culture with Pseudomonas thought to be colonization.   Urine cultures done positive for Pseudomonas.  Dr. Maryland Pink reviewed care everywhere and patient noted to have numerous positive urine cultures over the last year felt likely patient colonized.  Dr. Maryland Pink discussed with ID and felt no need to treat this specifically considering patient's history of recurrent C. difficile.  Follow.  6.  Recurrent C. difficile colitis Continue enteric precautions.  Continue oral vancomycin.  On an extended course of oral vancomycin while on antibiotics.  Vancomycin dose has been changed per ID to twice daily.  Outpatient follow-up.  7.  Normocytic anemia/folate deficiency/iron deficiency Hemoglobin stable at 8.1.  Follow H&H.  Transfusion threshold hemoglobin <7.   8.  Hypokalemia Potassium at 3.8.  Magnesium at 2.1.  Follow.   9.  Hypoalbuminemia Secondary to poor nutrition.  Nutritional supplementation.  10.  Hyperlipidemia Continue statin.   11.  Primary progressive multiple sclerosis Outpatient follow-up.  12.  Constipation Large amount of stool noted throughout the colon on CT scan.  Patient started on a bowel regimen and was having multiple bowel movement senna/MiraLAX changed to as needed.  Patient still with good bowel movements.  Follow.  13.  Trace bilateral pleural effusions Asymptomatic.  Follow.   14.  Pressure Injury 07/07/20 Sacrum Medial Stage 4 - Full thickness tissue loss with exposed bone, tendon or muscle. (Active)  07/07/20 0840  Location: Sacrum  Location Orientation: Medial  Staging: Stage 4 - Full thickness tissue loss with exposed bone, tendon or muscle.  Wound Description (Comments):   Present on Admission:        Pressure Injury 07/07/20 Back Left;Upper Stage 1 -  Intact skin with non-blanchable redness of a localized area usually over a bony prominence. (Active)  07/07/20 0840  Location: Back  Location Orientation: Left;Upper  Staging: Stage 1 -  Intact skin with non-blanchable redness of a localized area usually over a bony prominence.  Wound Description (Comments):   Present on Admission: Yes     Pressure Injury 07/07/20 Heel Right Stage 1 -  Intact skin with non-blanchable redness of a localized area usually over a bony prominence. (Active)  07/07/20 0840  Location: Heel  Location Orientation: Right  Staging: Stage 1 -  Intact skin with non-blanchable redness of a localized area usually over a bony prominence.  Wound Description (Comments):   Present on Admission: Yes         DVT prophylaxis: Lovenox Code Status: Full Family Communication: Updated patient.  No family at bedside. Disposition:   Status is: Inpatient    Dispo:  Patient From: [facility]  Planned Disposition: [facility]  Expected discharge date: 07/13/20  Medically stable for discharge: Yes        Consultants:   Palliative care: Dr. Rowe Pavy 07/10/2020  ID: Dr.Van Dam 07/09/2020  General surgery: Dr. Johney Maine 07/09/2020  Phineas Douglas, RN 07/07/2020  Procedures:   CT abdomen and pelvis 07/08/2020  Chest x-ray 07/07/2020  Lower extremity Dopplers 07/09/2020  Antimicrobials:  Oral vancomycin 07/07/2020>>>>  IV Unasyn 07/09/2020>>>  IV Rocephin 07/07/2020>>> 07/09/2020   Subjective: Sitting up in bed.  Denies any chest pain.  No shortness of breath.  No abdominal pain.  Having normal bowel movements and does not want MiraLAX scheduled at this time.  Alert and oriented.  States pain currently controlled.   Objective: Vitals:   07/10/20 2011 07/11/20 0538 07/11/20 1318 07/11/20 2036  BP: (!) 121/59 (!) 127/58 (!) 123/57 120/61  Pulse: 77 69 91 87  Resp: 20 19 19  16   Temp: 97.8 F (36.6 C) 98.3 F (36.8 C) 99.3 F (37.4 C) 98.1 F (36.7 C)  TempSrc: Oral Oral Oral Oral  SpO2: 98% 100% 97% 97%  Weight:      Height:        Intake/Output Summary (Last 24 hours) at 07/12/2020 1231 Last data filed at 07/12/2020 0600 Gross per 24 hour  Intake 300 ml  Output 700 ml  Net -400 ml   Filed Weights   07/07/20 0112  Weight: 61.2 kg    Examination:  General exam: NAD Respiratory system: CTAB.  No wheezes, no crackles, no rhonchi.  Normal respiratory effort.  Cardiovascular system: Regular rate rhythm no murmurs rubs or gallops.  No JVD.  No lower extremity edema.  Gastrointestinal system: Abdomen is soft, nontender, nondistended, positive bowel sounds.  No rebound.  No guarding.  Central nervous system: Alert and oriented. No focal neurological deficits. Extremities: Bilateral lower extremity weakness chronic.  Symmetric 5 x 5 power. Skin: No rashes, lesions or ulcers Psychiatry: Judgement and insight appear normal. Mood & affect appropriate.     Data Reviewed: I have personally reviewed following labs and imaging studies  CBC: Recent Labs  Lab 07/07/20 0102 07/07/20 0102 07/07/20 0605 07/07/20 0605 07/08/20 0915 07/09/20 0410 07/10/20 0350 07/11/20 0408 07/12/20 0430  WBC 14.6*   < > 18.1*   < > 12.3* 11.4* 9.6 7.2 8.3  NEUTROABS 11.6*  --  14.3*  --  9.8*  --   --   --   --   HGB 7.8*   < > 8.9*   < > 8.1* 8.0* 7.9* 7.6* 8.1*  HCT 26.4*   < > 29.5*   < > 27.5* 26.3* 26.7* 25.9* 27.2*  MCV 85.7   < > 85.3   < > 84.9 85.4 85.3 86.9 85.5  PLT 465*   < > 630*   < > 517* 483* 416* 402* 418*   < > = values in this interval not displayed.    Basic Metabolic Panel: Recent Labs  Lab 07/08/20 0915 07/09/20 0410 07/10/20 0350 07/11/20 0408 07/12/20 0430  NA 140 141 142 144 144  K 3.2* 3.3* 3.2* 3.6 3.8  CL 109 108 110 111 111  CO2 28 24 24 27 26   GLUCOSE 80 70 129* 79 84  BUN 10 10 10  6* 6*  CREATININE 0.50 0.48 0.66 0.50  0.60  CALCIUM 7.9* 7.9* 7.8* 8.1* 8.2*  MG 1.7  --  2.0  --  2.1  PHOS 2.4*  --   --   --   --     GFR: Estimated Creatinine Clearance: 63.9 mL/min (by C-G formula based on SCr of 0.6 mg/dL).  Liver Function Tests: Recent Labs  Lab 07/08/20 0915 07/09/20 0410 07/10/20 0350 07/11/20 0408 07/12/20 0430  AST 12* 11* 12* 10* 14*  ALT 7 8 9 7 9   ALKPHOS 71 68 65 56 64  BILITOT 0.2* 0.4  0.4 0.5 0.4  PROT 4.7* 4.6* 4.6* 4.1* 4.6*  ALBUMIN 1.6* 1.7* 1.7* 1.6* 1.7*    CBG: No results for input(s): GLUCAP in the last 168 hours.   Recent Results (from the past 240 hour(s))  Blood culture (routine single)     Status: None   Collection Time: 07/07/20  1:02 AM   Specimen: BLOOD  Result Value Ref Range Status   Specimen Description   Final    BLOOD RIGHT WRIST Performed at [facility] [address], [place], [place] [address]    Special Requests   Final    BOTTLES DRAWN AEROBIC AND ANAEROBIC Blood Culture adequate volume Performed at [facility] [address] [place], [place] [address]    Culture   Final    NO GROWTH 5 DAYS Performed at [facility], [place] [address] [place], [place] [address]    Report Status 07/12/2020 FINAL  Final  Urine culture     Status: Abnormal   Collection Time: 07/07/20  1:02 AM   Specimen: In/Out Cath Urine  Result Value Ref Range Status   Specimen Description   Final    IN/OUT CATH URINE Performed at [facility] [address] [place], [place] [address]    Special Requests   Final    NONE Performed at [facility], [place] [address], [place], [place] [address]    Culture >=100,000 COLONIES/mL PSEUDOMONAS AERUGINOSA (A)  Final   Report Status 07/10/2020 FINAL  Final   Organism ID, Bacteria PSEUDOMONAS AERUGINOSA (A)  Final      Susceptibility   Pseudomonas aeruginosa - MIC*    CEFTAZIDIME 16 INTERMEDIATE Intermediate     CIPROFLOXACIN 2 INTERMEDIATE  Intermediate     GENTAMICIN 8 INTERMEDIATE Intermediate     IMIPENEM 2 SENSITIVE Sensitive     PIP/TAZO 16 SENSITIVE Sensitive     CEFEPIME 16 RESISTANT Resistant     * >=100,000 COLONIES/mL PSEUDOMONAS AERUGINOSA  Resp Panel by RT-PCR (Flu A&B, Covid) Nasopharyngeal Swab     Status: None   Collection Time: 07/07/20  5:17 AM   Specimen: Nasopharyngeal Swab; Nasopharyngeal(NP) swabs in vial transport medium  Result Value Ref Range Status   SARS Coronavirus 2 by RT PCR NEGATIVE NEGATIVE Final    Comment: (NOTE) SARS-CoV-2 target nucleic acids are NOT DETECTED.  The SARS-CoV-2 RNA is generally detectable in upper respiratory specimens during the acute phase of infection. The lowest concentration of SARS-CoV-2 viral copies this assay can detect is 138 copies/mL. A negative result does not preclude SARS-Cov-2 infection and should not be used as the sole basis for treatment or other patient management decisions. A negative result may occur with  improper specimen collection/handling, submission of specimen other than nasopharyngeal swab, presence of viral mutation(s) within the areas targeted by this assay, and inadequate number of viral copies(<138 copies/mL). A negative result must be combined with clinical observations, patient history, and epidemiological information. The expected result is Negative.  Fact Sheet for Patients:  EntrepreneurPulse.com.au  Fact Sheet for Healthcare Providers:  IncredibleEmployment.be  This test is no t yet approved or cleared by the [place] FDA and  has been authorized for detection and/or diagnosis of SARS-CoV-2 by FDA under an Emergency Use Authorization (EUA). This EUA will remain  in effect (meaning this test can be used) for the duration of the COVID-19 declaration under Section 564(b)(1) of the Act, 21 U.S.C.section 360bbb-3(b)(1), unless the authorization is terminated  or  revoked sooner.        Influenza A by PCR NEGATIVE NEGATIVE Final   Influenza B by PCR NEGATIVE NEGATIVE Final    Comment: (NOTE) The Xpert Xpress SARS-CoV-2/FLU/RSV plus assay is intended as an aid in the diagnosis of influenza from Nasopharyngeal swab specimens and should not be used as a sole basis for treatment. Nasal washings and aspirates are unacceptable for Xpert Xpress SARS-CoV-2/FLU/RSV testing.  Fact Sheet for Patients: EntrepreneurPulse.com.au  Fact Sheet for Healthcare Providers: IncredibleEmployment.be  This test is not yet approved or cleared by the [place] FDA and has been authorized for detection and/or diagnosis of SARS-CoV-2 by FDA under an Emergency Use Authorization (EUA). This EUA will remain in effect (meaning this test can be used) for the duration of the COVID-19 declaration under Section 564(b)(1) of the Act, 21 U.S.C. section 360bbb-3(b)(1), unless the authorization is terminated or revoked.  Performed at [facility], [place] [address], [place], [place] [address]          Radiology Studies: No results found.      Scheduled Meds:  acetaminophen  1,000 mg Oral TID   vitamin C  500 mg Oral BID   baclofen  10 mg Oral QHS   baclofen  20 mg Oral TID with meals   Chlorhexidine Gluconate Cloth  6 each Topical Daily   cholecalciferol  6,000 Units Oral Daily   collagenase   Topical BID   dicyclomine  20 mg Oral TID AC   enoxaparin (LOVENOX) injection  40 mg Subcutaneous Q24H   feeding supplement (PRO-STAT SUGAR FREE 64)  30 mL Oral BID   ferrous sulfate  325 mg Oral Q breakfast   folic acid  1 mg Oral Daily   loratadine  10 mg Oral Daily   mirtazapine  7.5 mg Oral QHS   potassium chloride  10 mEq Oral Daily   pravastatin  40 mg Oral Daily   senna  2 tablet Oral QHS   tamsulosin  0.4 mg Oral Daily   vancomycin  125 mg Oral Q12H   vitamin B-12  1,000 mcg Oral Daily   Continuous  Infusions:  ampicillin-sulbactam (UNASYN) IV 3 g (07/12/20 1050)     LOS: 5 days    Time spent: 35 minutes    Irine Seal, MD [facility]   To contact the attending provider between 7A-7P or the covering provider during after hours 7P-7A, please log into the web site [email or web address] and access using universal [place] password for that web site. If you do not have the password, please call the hospital operator.  07/12/2020, 12:31 PM

## 2020-07-13 DIAGNOSIS — G9341 Metabolic encephalopathy: Secondary | ICD-10-CM | POA: Diagnosis not present

## 2020-07-13 DIAGNOSIS — I10 Essential (primary) hypertension: Secondary | ICD-10-CM | POA: Diagnosis not present

## 2020-07-13 DIAGNOSIS — K5900 Constipation, unspecified: Secondary | ICD-10-CM | POA: Diagnosis not present

## 2020-07-13 DIAGNOSIS — M8618 Other acute osteomyelitis, other site: Secondary | ICD-10-CM | POA: Diagnosis not present

## 2020-07-13 LAB — COMPREHENSIVE METABOLIC PANEL
ALT: 9 U/L (ref 0–44)
AST: 16 U/L (ref 15–41)
Albumin: 1.6 g/dL — ABNORMAL LOW (ref 3.5–5.0)
Alkaline Phosphatase: 63 U/L (ref 38–126)
Anion gap: 7 (ref 5–15)
BUN: 6 mg/dL — ABNORMAL LOW (ref 8–23)
CO2: 26 mmol/L (ref 22–32)
Calcium: 8.1 mg/dL — ABNORMAL LOW (ref 8.9–10.3)
Chloride: 111 mmol/L (ref 98–111)
Creatinine, Ser: 0.65 mg/dL (ref 0.44–1.00)
GFR, Estimated: >60 mL/min (ref >60)
Glucose, Bld: 82 mg/dL (ref 70–99)
Potassium: 3.6 mmol/L (ref 3.5–5.1)
Sodium: 144 mmol/L (ref 135–145)
Total Bilirubin: 0.5 mg/dL (ref 0.3–1.2)
Total Protein: 4.3 g/dL — ABNORMAL LOW (ref 6.5–8.1)

## 2020-07-13 LAB — CBC
HCT: 26.3 % — ABNORMAL LOW (ref 36.0–46.0)
Hemoglobin: 7.8 g/dL — ABNORMAL LOW (ref 12.0–15.0)
MCH: 25.8 pg — ABNORMAL LOW (ref 26.0–34.0)
MCHC: 29.7 g/dL — ABNORMAL LOW (ref 30.0–36.0)
MCV: 87.1 fL (ref 80.0–100.0)
Platelets: 384 10*3/uL (ref 150–400)
RBC: 3.02 MIL/uL — ABNORMAL LOW (ref 3.87–5.11)
RDW: 19.1 % — ABNORMAL HIGH (ref 11.5–15.5)
WBC: 6.7 10*3/uL (ref 4.0–10.5)
nRBC: 0 % (ref 0.0–0.2)

## 2020-07-13 LAB — SARS CORONAVIRUS 2 BY RT PCR (HOSPITAL ORDER, PERFORMED IN ~~LOC~~ HOSPITAL LAB): SARS Coronavirus 2: NEGATIVE

## 2020-07-13 MED ORDER — SENNA 8.6 MG PO TABS: 2.0000 | ORAL_TABLET | Freq: Every day | ORAL | 0 refills | Status: AC

## 2020-07-13 MED ORDER — ACETAMINOPHEN 500 MG PO TABS
1000.0000 mg | ORAL_TABLET | Freq: Three times a day (TID) | ORAL | 0 refills | Status: DC
Start: 2020-07-13 — End: 2020-07-30

## 2020-07-13 MED ORDER — ENOXAPARIN SODIUM 40 MG/0.4ML ~~LOC~~ SOLN: 40.0000 mg | SUBCUTANEOUS | Status: DC

## 2020-07-13 MED ORDER — BACLOFEN 10 MG PO TABS
10.0000 mg | ORAL_TABLET | Freq: Every day | ORAL | 0 refills | Status: DC
Start: 2020-07-13 — End: 2020-07-16

## 2020-07-13 MED ORDER — LORAZEPAM 0.5 MG PO TABS
0.5000 mg | ORAL_TABLET | Freq: Every day | ORAL | 0 refills | Status: DC | PRN
Start: 2020-07-13 — End: 2020-07-16

## 2020-07-13 MED ORDER — TRAMADOL HCL 50 MG PO TABS: 25.0000 mg | ORAL_TABLET | Freq: Three times a day (TID) | ORAL | 0 refills | Status: DC | PRN

## 2020-07-13 MED ORDER — COLLAGENASE 250 UNIT/GM EX OINT
TOPICAL_OINTMENT | Freq: Two times a day (BID) | CUTANEOUS | 0 refills | Status: DC
Start: 2020-07-13 — End: 2020-07-16

## 2020-07-13 MED ORDER — POLYETHYLENE GLYCOL 3350 17 G PO PACK: 17.0000 g | PACK | Freq: Every day | ORAL | 0 refills | Status: AC | PRN

## 2020-07-13 MED ORDER — FOLIC ACID 1 MG PO TABS: 1.0000 mg | ORAL_TABLET | Freq: Every day | ORAL | Status: AC

## 2020-07-13 MED ORDER — FERROUS SULFATE 325 (65 FE) MG PO TABS: 325.0000 mg | ORAL_TABLET | Freq: Every day | ORAL | 3 refills | Status: AC

## 2020-07-13 NOTE — Discharge Summary (Signed)
Physician Discharge Summary  Renee Huang [name]:[id number] DOB: 11-02-1955 DOA: 07/07/2020  PCP: [name], DO  Admit date: 07/07/2020 Discharge date: 07/13/2020  Time spent: 55 minutes  Recommendations for Outpatient Follow-up:  1. Follow-up with MD at skilled nursing facility.  Patient will need a basic metabolic profile as well as a magnesium level checked in 1 week to follow-up on electrolytes and renal function. 2. Follow-up with Dr. Baxter Flattery, ID on 07/30/2020 at 10 AM.   Discharge Diagnoses:  Principal Problem:   Acute encephalopathy Active Problems:   Hypertension   Multiple sclerosis, primary progressive (Renee Huang)   Recurrent colitis due to Clostridium difficile   Anemia   Acute lower UTI   Acute metabolic encephalopathy   Pressure injury of skin   Sacral decubitus ulcer, stage IV (HCC)   Goals of care, counseling/discussion   Pain associated with wound   Palliative care by specialist   Normocytic anemia   Acute osteomyelitis of coccyx (Renee Huang)   Constipation   Hypokalemia   Discharge Condition: Stable and improved  Diet recommendation: Regular  Filed Weights   07/07/20 0112  Weight: 61.2 kg    History of present illness:  HPI per Dr. [name] is a 64 y.o. female with history of primary progressive multiple sclerosis presently not on any disease modifying medication was found to be lethargic at the facility and was brought to the ER.  Patient had been recently treated for recurrent C. difficile colitis on vancomycin.  Patient had worsening back pain and as per the report was given some hydrocodone following which patient became more lethargic.  It was told that patient was hypotensive.  ED Course: In the ER patient is mildly lethargic but answering questions appropriately and he is giving good history.  UA is concerning for UTI.  Patient is maintaining his sats.  Patient states she took some hydrocodone after she had some worsening back pain  yesterday following which she became more confused.  Patient denies any headache nausea vomiting or abdominal pain.  In the ER patient had one episode of loose stools.  Labs showing worsening anemia usually hemoglobin is around 9 at baseline is around 7 at this time.  Stool did not look melanotic in the ER as per the ER physician.  Patient was started on ceftriaxone for UTI admitted for lethargy/acute encephalopathy could be related to pain medication although could be secondary to UTI.  Covid test was negative  Hospital Course:  1 acute metabolic encephalopathy Felt secondary to administration of hydrocodone which was given at the nursing facility secondary to back pain.  Patient noted to be more lethargic after receiving pain medication.  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  Ammonia level within normal limits.  Vitamin B12 was normal.  HIV nonreactive.  Patient's Norco and tizanidine were discontinued during the hospitalization.  Baclofen was continued however dose decreased due to concerns for lethargy.   Patient with clinical improvement.  Alert and oriented to self place and time.    Patient was seen by palliative care and patient's pain was controlled on the pain regimen of Ultram as needed as well as scheduled Tylenol per palliative care recommendations.    Patient improved clinically and was at baseline by day of discharge.   2.  Stage IV sacral decubitus ulcer with wound infection/osteomyelitis of the coccyx CT abdomen and pelvis done concerning for large sacral decubitus ulcer findings consistent  with osteomyelitis of the coccyx.  CT findings new compared to prior CT done in February.  Patient seen in consultation by general surgery no plans for surgical debridement of sacral decubitus at this time.  Patient seen by wound care nurse continue current wound care recommendations.  Patient noted to  have been on IV antibiotics in the outpatient setting prior to presentation to the ED.  Patient already with PICC line in place and Unasyn ordered x4 weeks per ID recommendations.  Procalcitonin of 0.27, CRP at 0.7.  WBC elevated at 18.1 on presentation but has normalized.    Patient maintained on IV Unasyn which patient will be discharged to skilled nursing facility on.  Outpatient follow-up with ID, Dr. Baxter Flattery 07/30/2020.    3.  Abnormal right common femoral vein Possible filling defect noted on CT.  Lower extremity Dopplers done negative for DVT.  No further work-up needed at this time.  Outpatient follow-up.  4.  Severe bilateral hydroureteronephrosis Felt secondary to chronic bladder outlet obstruction.  Noted on prior CT scans.  Patient with output during the hospitalization. Diffuse bladder wall thickening also noted concerning for cystitis.    Patient maintained on home regimen Flomax.  Patient also noted to be on IV antibiotics secondary to decubitus ulcer.  Outpatient follow-up.    5.  Abnormal urine culture with Pseudomonas thought to be colonization.   Urine cultures done positive for Pseudomonas.  Dr. Maryland Pink reviewed care everywhere and patient noted to have numerous positive urine cultures over the last year felt likely patient colonized.  Dr. Maryland Pink discussed with ID and felt no need to treat this specifically considering patient's history of recurrent C. difficile.   6.  Recurrent C. difficile colitis Continue enteric precautions.  Continue oral vancomycin.  On an extended course of oral vancomycin while on antibiotics.  Vancomycin dose was changed per ID to twice daily.  Outpatient follow-up with ID.  7.  Normocytic anemia/folate deficiency/iron deficiency Hemoglobin stable at 8.1.    8.  Hypokalemia Potassium at 3.8.  Magnesium at 2.1.    Repleted.  Outpatient follow-up.   9.  Hypoalbuminemia Secondary to poor nutrition.  Nutritional supplementation.  10.   Hyperlipidemia Patient maintained on statin.   11.  Primary progressive multiple sclerosis Outpatient follow-up.  12.  Constipation Large amount of stool noted throughout the colon on CT scan.  Patient started on a bowel regimen and was having multiple bowel movement and as such /MiraLAX changed to as needed.  Patient still with good bowel movements.    Outpatient follow-up.    13.  Trace bilateral pleural effusions Asymptomatic.  Follow.   14.  Pressure Injury 07/07/20 Sacrum Medial Stage 4 - Full thickness tissue loss with exposed bone, tendon or muscle. (Active)  07/07/20 0840  Location: Sacrum  Location Orientation: Medial  Staging: Stage 4 - Full thickness tissue loss with exposed bone, tendon or muscle.  Wound Description (Comments):   Present on Admission:      Pressure Injury 07/07/20 Back Left;Upper Stage 1 -  Intact skin with non-blanchable redness of a localized area usually over a bony prominence. (Active)  07/07/20 0840  Location: Back  Location Orientation: Left;Upper  Staging: Stage 1 -  Intact skin with non-blanchable redness of a localized area usually over a bony prominence.  Wound Description (Comments):   Present on Admission: Yes     Pressure Injury 07/07/20 Heel Right Stage 1 -  Intact skin with non-blanchable redness of a localized area usually over  a bony prominence. (Active)  07/07/20 0840  Location: Heel  Location Orientation: Right  Staging: Stage 1 -  Intact skin with non-blanchable redness of a localized area usually over a bony prominence.  Wound Description (Comments):   Present on Admission: Yes       Procedures:  CT abdomen and pelvis 07/08/2020  Chest x-ray 07/07/2020  Lower extremity Dopplers 07/09/2020  Consultations:  Palliative care: Dr. Rowe Pavy 07/10/2020  ID: Dr.Van Dam 07/09/2020  General surgery: Dr. Johney Maine 07/09/2020  Phineas Douglas, RN 07/07/2020  Discharge Exam: Vitals:   07/12/20 2138 07/13/20 0448  BP:  108/64 (!) 148/79  Pulse: 78 100  Resp: 16 16  Temp: 97.9 F (36.6 C) (!) 97.3 F (36.3 C)  SpO2: 98%     General: NAD Cardiovascular: RRR Respiratory: CTAB anterior lung fields.  No wheezes, no crackles, no rhonchi.  Discharge Instructions   Discharge Instructions    Diet general   Complete by: As directed    Discharge wound care:   Complete by: As directed    AS ABOVE   Home infusion instructions   Complete by: As directed    Instructions: Flushing of vascular access device: 0.9% NaCl pre/post medication administration and prn patency; Heparin 100 u/ml, 8m for implanted ports and Heparin 10u/ml, 562mfor all other central venous catheters.   Increase activity slowly   Complete by: As directed      Allergies as of 07/13/2020   No Known Allergies     Medication List    STOP taking these medications   HYDROcodone-acetaminophen 5-325 MG tablet Commonly known as: NORCO/VICODIN   tiZANidine 2 MG tablet Commonly known as: ZANAFLEX     TAKE these medications   acetaminophen 500 MG tablet Commonly known as: TYLENOL Take 2 tablets (1,000 mg total) by mouth 3 (three) times daily. What changed:   medication strength  how much to take  when to take this  reasons to take this   ampicillin-sulbactam  IVPB Commonly known as: UNASYN Inject 3 g into the vein every 6 (six) hours for 26 days. Indication:  Osteomyelitis of coccyx First Dose: Yes Last Day of Therapy:  08/05/2020 Labs - Once weekly:  CBC/D and BMP, Labs - Every other week:  ESR and CRP Method of administration: Mini-Bag Plus / Gravity Method of administration may be changed at the discretion of home infusion pharmacist based upon assessment of the patient and/or caregiver's ability to self-administer the medication ordered. What changed: additional instructions   baclofen 20 MG tablet Commonly known as: LIORESAL Take 20 mg by mouth 3 (three) times daily. What changed: Another medication with the same  name was changed. Make sure you understand how and when to take each.   baclofen 10 MG tablet Commonly known as: LIORESAL Take 1 tablet (10 mg total) by mouth at bedtime. Take 3 tablets to = 30 mg What changed: how much to take   BIOFREEZE EX Apply 5 % topically in the morning, at noon, in the evening, and at bedtime. Apply to Left Shoulder.   cetirizine 5 MG tablet Commonly known as: ZYRTEC Take 1 tablet (5 mg total) by mouth at bedtime.   collagenase ointment Commonly known as: SANTYL Apply topically 2 (two) times daily. Apply to sacral wound BID with dressing changes   Culturelle Caps Take by mouth. 10B Cell Capsule   dicyclomine 20 MG tablet Commonly known as: BENTYL Take 20 mg by mouth in the morning, at noon, in the evening, and at  bedtime.   enoxaparin 40 MG/0.4ML injection Commonly known as: LOVENOX Inject 0.4 mLs (40 mg total) into the skin daily. Start taking on: July 14, 2020   feeding supplement (PRO-STAT SUGAR FREE 64) Liqd Take 30 mLs by mouth in the morning and at bedtime.   ferrous sulfate 325 (65 FE) MG tablet Take 1 tablet (325 mg total) by mouth daily with breakfast. Start taking on: July 14, 2020   FLUoxetine 40 MG capsule Commonly known as: PROZAC Take 1 capsule (40 mg total) by mouth daily.   folic acid 1 MG tablet Commonly known as: FOLVITE Take 1 tablet (1 mg total) by mouth daily. Start taking on: July 14, 2020   hydrocortisone cream 1 % Apply 1 application topically every 12 (twelve) hours as needed for itching.   JUVEN PO Take 1 each by mouth in the morning and at bedtime.   ENSURE CLEAR PO Take by mouth in the morning and at bedtime. D/t weight loss and wound healing   LORazepam 0.5 MG tablet Commonly known as: ATIVAN Take 1 tablet (0.5 mg total) by mouth daily as needed (Panic attack).   mirtazapine 7.5 MG tablet Commonly known as: REMERON Take 7.5 mg by mouth at bedtime.   NON FORMULARY Magic Cup with L/D meals  d/t weight loss.   ondansetron 4 MG tablet Commonly known as: ZOFRAN Take 4 mg by mouth in the morning, at noon, and at bedtime. Nausea and Vomiting   polyethylene glycol 17 g packet Commonly known as: MIRALAX / GLYCOLAX Take 17 g by mouth daily as needed for mild constipation.   potassium chloride 10 MEQ tablet Commonly known as: KLOR-CON Take 10 mEq by mouth daily.   pravastatin 40 MG tablet Commonly known as: PRAVACHOL Take 40 mg by mouth daily.   senna 8.6 MG Tabs tablet Commonly known as: SENOKOT Take 2 tablets (17.2 mg total) by mouth at bedtime.   tamsulosin 0.4 MG Caps capsule Commonly known as: FLOMAX Take 0.4 mg by mouth daily.   traMADol 50 MG tablet Commonly known as: ULTRAM Take 0.5 tablets (25 mg total) by mouth every 8 (eight) hours as needed for severe pain.   vancomycin 50 mg/mL  oral solution Commonly known as: VANCOCIN Take 2.5 mLs (125 mg total) by mouth every 12 (twelve) hours for 26 days.   vitamin B-12 1000 MCG tablet Commonly known as: CYANOCOBALAMIN Take 1,000 mcg by mouth daily.   VITAMIN C PO Take 500 mg by mouth 2 (two) times daily.   Vitamin D (Cholecalciferol) 25 MCG (1000 UT) Tabs Take 5,000 Units by mouth daily. Along with a 1000 unit to = 6000 units What changed: Another medication with the same name was removed. Continue taking this medication, and follow the directions you see here.            Home Infusion Instuctions  (From admission, onward)         Start     Ordered   07/10/20 0000  Home infusion instructions       Question:  Instructions  Answer:  Flushing of vascular access device: 0.9% NaCl pre/post medication administration and prn patency; Heparin 100 u/ml, 1m for implanted ports and Heparin 10u/ml, 517mfor all other central venous catheters.   07/10/20 1020           Discharge Care Instructions  (From admission, onward)         Start     Ordered   07/13/20 0000  Discharge wound  care:       Comments:  AS ABOVE   07/13/20 1138         No Known Allergies  Follow-up Information    MD AT SNF Follow up.        Carlyle Basques, MD Follow up on 07/30/2020.   Specialty: Infectious Diseases Why: F/U AS SCHEDULED 10AM Contact information: [address] [place] [address] [phone]                The results of significant diagnostics from this hospitalization (including imaging, microbiology, ancillary and laboratory) are listed below for reference.    Significant Diagnostic Studies: CT ABDOMEN PELVIS W CONTRAST  Result Date: 07/08/2020 CLINICAL DATA:  Abdominal abscess/infection suspected. Sacral wound infection. Back pain. EXAM: CT ABDOMEN AND PELVIS WITH CONTRAST TECHNIQUE: Multidetector CT imaging of the abdomen and pelvis was performed using the standard protocol following bolus administration of intravenous contrast. CONTRAST:  19m OMNIPAQUE IOHEXOL 300 MG/ML  SOLN COMPARISON:  CT dated 10/03/2019 FINDINGS: Lower chest: There are trace bilateral pleural effusions, left greater than right. There is bibasilar atelectasis, left worse than right.The heart size is normal. Hepatobiliary: The liver is normal. Normal gallbladder.There is no biliary ductal dilation. Pancreas: Normal contours without ductal dilatation. No peripancreatic fluid collection. Spleen: Unremarkable. Adrenals/Urinary Tract: --Adrenal glands: Unremarkable. --Right kidney/ureter: There is severe right-sided hydroureteronephrosis to the level of the urinary bladder. --Left kidney/ureter: There is severe left-sided hydronephrosis to the level of the urinary bladder. There are areas of cortical thinning and scarring. The left-sided collecting system is partially duplicated. --Urinary bladder: The urinary bladder is trabeculated with multiple bladder diverticula. There is diffuse bladder wall thickening with adjacent fat stranding. Stomach/Bowel: --Stomach/Duodenum: No hiatal hernia or other gastric  abnormality. Normal duodenal course and caliber. --Small bowel: Unremarkable. --Colon: There is a large amount of stool throughout the colon. --Appendix: Normal. Vascular/Lymphatic: Atherosclerotic calcification is present within the non-aneurysmal abdominal aorta, without hemodynamically significant stenosis. There is a questionable filling defect within the right common femoral vein (axial series 2, image 75). --No retroperitoneal lymphadenopathy. --No mesenteric lymphadenopathy. --No pelvic or inguinal lymphadenopathy. Reproductive: Unremarkable Other: There is mild body wall edema. There are few pockets of subcutaneous gas on the patient's low anterior left abdomen favored to represent sequela of a prior subcutaneous injection. Musculoskeletal. 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 There is presacral edema which is likely reactive. These findings are new since the patient's CT dated 10/03/2019. IMPRESSION: 1. 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 These findings are new since the patient's CT dated 10/03/2019. 2. There is a questionable filling defect within the right common femoral vein, concerning for DVT. Recommend further evaluation with a dedicated right lower extremity venous Doppler ultrasound. 3. Severe bilateral hydroureteronephrosis to the level of the urinary bladder. This is likely secondary to chronic bladder outlet obstruction. 4. Diffuse bladder wall thickening with adjacent fat stranding is concerning for cystitis. Correlation with urinalysis is recommended. 5. Trace bilateral pleural effusions, left greater than right, with bibasilar atelectasis, left worse than right. 6. Large amount of stool throughout the colon. Aortic Atherosclerosis (ICD10-I70.0). Electronically Signed   By: CConstance HolsterM.D.   On: 07/08/2020 22:47   DG Chest  Port 1 View  Result Date: 07/07/2020 CLINICAL DATA:  Possible overdose. EXAM: PORTABLE CHEST 1 VIEW COMPARISON:  January 19, 2020 FINDINGS: A right-sided PICC line  is seen with its distal tip noted at the junction of the superior vena cava and right atrium. There is no evidence of acute infiltrate. A small left pleural effusion is seen. No pneumothorax is identified. The heart size and mediastinal contours are within normal limits. The visualized skeletal structures are unremarkable. IMPRESSION: 1. Right-sided PICC line positioning, as described above. 2. Small left pleural effusion. Electronically Signed   By: Virgina Norfolk M.D.   On: 07/07/2020 02:40   VAS [place] LOWER EXTREMITY VENOUS (DVT)  Result Date: 07/09/2020  Lower Venous DVT Study Indications: Swelling.  Risk Factors: Immobility. Limitations: Poor ultrasound/tissue interface and patient positioning, patient involuntary movement. Comparison Study: No prior studies. Performing Technologist: Oliver Hum RVT  Examination Guidelines: A complete evaluation includes B-mode imaging, spectral Doppler, color Doppler, and power Doppler as needed of all accessible portions of each vessel. Bilateral testing is considered an integral part of a complete examination. Limited examinations for reoccurring indications may be performed as noted. The reflux portion of the exam is performed with the patient in reverse Trendelenburg.  +---------+---------------+---------+-----------+----------+--------------+ RIGHT    CompressibilityPhasicitySpontaneityPropertiesThrombus Aging +---------+---------------+---------+-----------+----------+--------------+ CFV      Full           Yes      Yes                                 +---------+---------------+---------+-----------+----------+--------------+ SFJ      Full                                                        +---------+---------------+---------+-----------+----------+--------------+ FV Prox   Full                                                        +---------+---------------+---------+-----------+----------+--------------+ FV Mid   Full                                                        +---------+---------------+---------+-----------+----------+--------------+ FV DistalFull                                                        +---------+---------------+---------+-----------+----------+--------------+ PFV      Full                                                        +---------+---------------+---------+-----------+----------+--------------+ POP      Full           Yes      Yes                                 +---------+---------------+---------+-----------+----------+--------------+  PTV      Full                                                        +---------+---------------+---------+-----------+----------+--------------+ PERO     Full                                                        +---------+---------------+---------+-----------+----------+--------------+   +---------+---------------+---------+-----------+----------+--------------+ LEFT     CompressibilityPhasicitySpontaneityPropertiesThrombus Aging +---------+---------------+---------+-----------+----------+--------------+ CFV      Full           Yes      Yes                                 +---------+---------------+---------+-----------+----------+--------------+ SFJ      Full                                                        +---------+---------------+---------+-----------+----------+--------------+ FV Prox  Full                                                        +---------+---------------+---------+-----------+----------+--------------+ FV Mid   Full                                                        +---------+---------------+---------+-----------+----------+--------------+ FV DistalFull                                                         +---------+---------------+---------+-----------+----------+--------------+ PFV      Full                                                        +---------+---------------+---------+-----------+----------+--------------+ POP      Full           Yes      Yes                                 +---------+---------------+---------+-----------+----------+--------------+ PTV      Full                                                        +---------+---------------+---------+-----------+----------+--------------+  PERO     Full                                                        +---------+---------------+---------+-----------+----------+--------------+     Summary: RIGHT: - There is no evidence of deep vein thrombosis in the lower extremity. However, portions of this examination were limited- see technologist comments above.  - No cystic structure found in the popliteal fossa.  LEFT: - There is no evidence of deep vein thrombosis in the lower extremity. However, portions of this examination were limited- see technologist comments above.  - No cystic structure found in the popliteal fossa.  *See table(s) above for measurements and observations. Electronically signed by Monica Martinez MD on 07/09/2020 at 4:41:06 PM.    Final     Microbiology: Recent Results (from the past 240 hour(s))  Blood culture (routine single)     Status: None   Collection Time: 07/07/20  1:02 AM   Specimen: BLOOD  Result Value Ref Range Status   Specimen Description   Final    BLOOD RIGHT WRIST Performed at [facility] [address], [place], [place] [address]    Special Requests   Final    BOTTLES DRAWN AEROBIC AND ANAEROBIC Blood Culture adequate volume Performed at [facility] [address] [place], [place] [address]    Culture   Final    NO GROWTH 5 DAYS Performed at [facility], [place] [address], [place], [place] [address]     Report Status 07/12/2020 FINAL  Final  Urine culture     Status: Abnormal   Collection Time: 07/07/20  1:02 AM   Specimen: In/Out Cath Urine  Result Value Ref Range Status   Specimen Description   Final    IN/OUT CATH URINE Performed at [facility] [address] [place], [place] [address]    Special Requests   Final    NONE Performed at [facility], [place] [address], [place], [place] [address]    Culture >=100,000 COLONIES/mL PSEUDOMONAS AERUGINOSA (A)  Final   Report Status 07/10/2020 FINAL  Final   Organism ID, Bacteria PSEUDOMONAS AERUGINOSA (A)  Final      Susceptibility   Pseudomonas aeruginosa - MIC*    CEFTAZIDIME 16 INTERMEDIATE Intermediate     CIPROFLOXACIN 2 INTERMEDIATE Intermediate     GENTAMICIN 8 INTERMEDIATE Intermediate     IMIPENEM 2 SENSITIVE Sensitive     PIP/TAZO 16 SENSITIVE Sensitive     CEFEPIME 16 RESISTANT Resistant     * >=100,000 COLONIES/mL PSEUDOMONAS AERUGINOSA  Resp Panel by RT-PCR (Flu A&B, Covid) Nasopharyngeal Swab     Status: None   Collection Time: 07/07/20  5:17 AM   Specimen: Nasopharyngeal Swab; Nasopharyngeal(NP) swabs in vial transport medium  Result Value Ref Range Status   SARS Coronavirus 2 by RT PCR NEGATIVE NEGATIVE Final    Comment: (NOTE) SARS-CoV-2 target nucleic acids are NOT DETECTED.  The SARS-CoV-2 RNA is generally detectable in upper respiratory specimens during the acute phase of infection. The lowest concentration of SARS-CoV-2 viral copies this assay can detect is 138 copies/mL. A negative result does not preclude SARS-Cov-2 infection and should not be used as the sole basis for treatment or other patient management decisions. A negative result may occur with  improper specimen collection/handling, submission of specimen other than nasopharyngeal swab, presence of viral mutation(s) within the areas targeted by this assay, and inadequate number of viral copies(<138 copies/mL). A  negative result must be combined with clinical observations, patient history, and epidemiological information. The expected result is Negative.  Fact Sheet for Patients:  EntrepreneurPulse.com.au  Fact Sheet for Healthcare Providers:  IncredibleEmployment.be  This test is no t yet approved or cleared by the [place] FDA and  has been authorized for detection and/or diagnosis of SARS-CoV-2 by FDA under an Emergency Use Authorization (EUA). This EUA will remain  in effect (meaning this test can be used) for the duration of the COVID-19 declaration under Section 564(b)(1) of the Act, 21 U.S.C.section 360bbb-3(b)(1), unless the authorization is terminated  or revoked sooner.       Influenza A by PCR NEGATIVE NEGATIVE Final   Influenza B by PCR NEGATIVE NEGATIVE Final    Comment: (NOTE) The Xpert Xpress SARS-CoV-2/FLU/RSV plus assay is intended as an aid in the diagnosis of influenza from Nasopharyngeal swab specimens and should not be used as a sole basis for treatment. Nasal washings and aspirates are unacceptable for Xpert Xpress SARS-CoV-2/FLU/RSV testing.  Fact Sheet for Patients: EntrepreneurPulse.com.au  Fact Sheet for Healthcare Providers: IncredibleEmployment.be  This test is not yet approved or cleared by the [place] FDA and has been authorized for detection and/or diagnosis of SARS-CoV-2 by FDA under an Emergency Use Authorization (EUA). This EUA will remain in effect (meaning this test can be used) for the duration of the COVID-19 declaration under Section 564(b)(1) of the Act, 21 U.S.C. section 360bbb-3(b)(1), unless the authorization is terminated or revoked.  Performed at [facility], [place] [address], [place], [place] [address]   SARS Coronavirus 2 by RT PCR (hospital order, performed in [facility] hospital lab) Nasopharyngeal Nasopharyngeal Swab     Status:  None   Collection Time: 07/13/20 10:55 AM   Specimen: Nasopharyngeal Swab  Result Value Ref Range Status   SARS Coronavirus 2 NEGATIVE NEGATIVE Final    Comment: (NOTE) SARS-CoV-2 target nucleic acids are NOT DETECTED.  The SARS-CoV-2 RNA is generally detectable in upper and lower respiratory specimens during the acute phase of infection. The lowest concentration of SARS-CoV-2 viral copies this assay can detect is 250 copies / mL. A negative result does not preclude SARS-CoV-2 infection and should not be used as the sole basis for treatment or other patient management decisions.  A negative result may occur with improper specimen collection / handling, submission of specimen other than nasopharyngeal swab, presence of viral mutation(s) within the areas targeted by this assay, and inadequate number of viral copies (<250 copies / mL). A negative result must be combined with clinical observations, patient history, and epidemiological information.  Fact Sheet for Patients:   StrictlyIdeas.no  Fact Sheet for Healthcare Providers: BankingDealers.co.za  This test is not yet approved or  cleared by the [place] FDA and has been authorized for detection and/or diagnosis of SARS-CoV-2 by FDA under an Emergency Use Authorization (EUA).  This EUA will remain in effect (meaning this test can be used) for the duration of the COVID-19 declaration under Section 564(b)(1) of the Act, 21 U.S.C. section 360bbb-3(b)(1), unless the authorization is terminated or revoked sooner.  Performed at [facility], [place] [address], [place],  [address]      Labs: Basic Metabolic Panel: Recent Labs  Lab 07/08/20 0915 07/08/20 0915 07/09/20 0410 07/10/20 0350 07/11/20 0408 07/12/20  0430 07/13/20 0334  NA 140   < > 141 142 144 144 144  K 3.2*   < > 3.3* 3.2* 3.6 3.8 3.6  CL 109   < > 108 110 111 111 111  CO2 28   < > _0 GLUCOSE 80   < > 70 129* 79 84 82  BUN 10   < > 10 10 6* 6* 6*  CREATININE 0.50   < > 0.48 0.66 0.50 0.60 0.65  CALCIUM 7.9*   < > 7.9* 7.8* 8.1* 8.2* 8.1*  MG 1.7  --   --  2.0  --  2.1  --   PHOS 2.4*  --   --   --   --   --   --    < > = values in this interval not displayed.   Liver Function Tests: Recent Labs  Lab 07/09/20 0410 07/10/20 0350 07/11/20 0408 07/12/20 0430 07/13/20 0334  AST 11* 12* 10* 14* 16  ALT _1 ALKPHOS 68 65 56 64 63  BILITOT 0.4 0.4 0.5 0.4 0.5  PROT 4.6* 4.6* 4.1* 4.6* 4.3*  ALBUMIN 1.7* 1.7* 1.6* 1.7* 1.6*   No results for input(s): LIPASE, AMYLASE in the last 168 hours. Recent Labs  Lab 07/07/20 0605  AMMONIA 13   CBC: Recent Labs  Lab 07/07/20 0102 07/07/20 0102 07/07/20 0605 07/07/20 0605 07/08/20 0915 07/08/20 0915 07/09/20 0410 07/10/20 0350 07/11/20 0408 07/12/20 0430 07/13/20 0334  WBC 14.6*   < > 18.1*   < > 12.3*   < > 11.4* 9.6 7.2 8.3 6.7  NEUTROABS 11.6*  --  14.3*  --  9.8*  --   --   --   --   --   --   HGB 7.8*   < > 8.9*   < > 8.1*   < > 8.0* 7.9* 7.6* 8.1* 7.8*  HCT 26.4*   < > 29.5*   < > 27.5*   < > 26.3* 26.7* 25.9* 27.2* 26.3*  MCV 85.7   < > 85.3   < > 84.9   < > 85.4 85.3 86.9 85.5 87.1  PLT 465*   < > 630*   < > 517*   < > 483* 416* 402* 418* 384   < > = values in this interval not displayed.   Cardiac Enzymes: No results for input(s): CKTOTAL, CKMB, CKMBINDEX, TROPONINI in the last 168 hours. BNP: BNP (last 3 results) Recent Labs    10/01/19 2152  BNP 169.0*    ProBNP (last 3 results) No results for input(s): PROBNP in the last 8760 hours.  CBG: No results for input(s): GLUCAP in the last 168 hours.     Signed:  Irine Seal MD.  [facility] 07/13/2020, 11:52 AM

## 2020-07-13 NOTE — Progress Notes (Signed)
AVS given to patient and explained at the bedside. AVS also explained over the phone with the receiving nurse at [facility]. Medications and follow up appointments have been explained with pt and the pt's nurse verbalizing understanding.

## 2020-07-13 NOTE — TOC Transition Note (Signed)
Transition of Care [facility]) - CM/SW Discharge Note   Patient Details  Name: Markeia Harkless MRN: [id number] Date of Birth: 1956/02/01  Transition of Care [facility]) CM/SW Contact:  Lennart Pall, LCSW Phone Number: 07/13/2020, 12:45 PM   Clinical Narrative:    Pt medically cleared for dc today.  Insurance auth received following peer-to-peer with MD (ref # [id number]) and Rober Minion ready to accept pt back today.  Pt and niece aware and agreeable.  Covid test complete.  PTAR called.  RN to call report to [phone].   Final next level of care: Skilled Nursing Facility Barriers to Discharge: No Barriers Identified   Patient Goals and CMS Choice Patient states their goals for this hospitalization and ongoing recovery are:: PT is sleeping at present time, after receiving medications   Choice offered to / list presented to : Patient  Discharge Placement              Patient chooses bed at: [facility] Patient to be transferred to facility by: [facility] Name of family member notified: niece, [name] Patient and family notified of of transfer: 07/13/20  Discharge Plan and Services                DME Arranged: N/A DME Agency: NA       HH Arranged: NA HH Agency: NA        Social Determinants of Health ([place]) Interventions     Readmission Risk Interventions Readmission Risk Prevention Plan 07/13/2020  Transportation Screening Complete  PCP or Specialist Appt within 5-7 Days Complete  Home Care Screening Complete

## 2020-07-16 ENCOUNTER — Non-Acute Institutional Stay (SKILLED_NURSING_FACILITY): Payer: Medicare Other | Admitting: Orthopedic Surgery

## 2020-07-16 ENCOUNTER — Other Ambulatory Visit: Payer: Self-pay | Admitting: Orthopedic Surgery

## 2020-07-16 ENCOUNTER — Encounter: Payer: Self-pay | Admitting: Orthopedic Surgery

## 2020-07-16 DIAGNOSIS — G9340 Encephalopathy, unspecified: Secondary | ICD-10-CM

## 2020-07-16 DIAGNOSIS — G35 Multiple sclerosis: Secondary | ICD-10-CM | POA: Diagnosis not present

## 2020-07-16 DIAGNOSIS — A0471 Enterocolitis due to Clostridium difficile, recurrent: Secondary | ICD-10-CM

## 2020-07-16 DIAGNOSIS — F410 Panic disorder [episodic paroxysmal anxiety] without agoraphobia: Secondary | ICD-10-CM

## 2020-07-16 DIAGNOSIS — M8618 Other acute osteomyelitis, other site: Secondary | ICD-10-CM

## 2020-07-16 DIAGNOSIS — T148XXA Other injury of unspecified body region, initial encounter: Secondary | ICD-10-CM

## 2020-07-16 DIAGNOSIS — R8279 Other abnormal findings on microbiological examination of urine: Secondary | ICD-10-CM

## 2020-07-16 DIAGNOSIS — I879 Disorder of vein, unspecified: Secondary | ICD-10-CM

## 2020-07-16 DIAGNOSIS — M4628 Osteomyelitis of vertebra, sacral and sacrococcygeal region: Secondary | ICD-10-CM

## 2020-07-16 DIAGNOSIS — R52 Pain, unspecified: Secondary | ICD-10-CM

## 2020-07-16 LAB — CBC AND DIFFERENTIAL
HCT: 25 — AB (ref 36–46)
Hemoglobin: 8 — AB (ref 12.0–16.0)
Platelets: 355 (ref 150–399)
WBC: 7.9

## 2020-07-16 LAB — COMPREHENSIVE METABOLIC PANEL
Calcium: 8.1 — AB (ref 8.7–10.7)
GFR calc Af Amer: >90
GFR calc non Af Amer: >90

## 2020-07-16 LAB — CBC: RBC: 3.11 — AB (ref 3.87–5.11)

## 2020-07-16 LAB — BASIC METABOLIC PANEL
BUN: 22 — AB (ref 4–21)
CO2: 26 — AB (ref 13–22)
Chloride: 114 — AB (ref 99–108)
Creatinine: 0.5 (ref 0.5–1.1)
Glucose: 80
Potassium: 3.8 (ref 3.4–5.3)
Sodium: 147 (ref 137–147)

## 2020-07-16 LAB — POCT ERYTHROCYTE SEDIMENTATION RATE, NON-AUTOMATED: Sed Rate: 82

## 2020-07-16 MED ORDER — LORAZEPAM 0.5 MG PO TABS: 0.5000 mg | ORAL_TABLET | Freq: Every day | ORAL | 0 refills | Status: DC | PRN

## 2020-07-16 MED ORDER — TRAMADOL HCL 50 MG PO TABS: 25.0000 mg | ORAL_TABLET | Freq: Three times a day (TID) | ORAL | 0 refills | Status: DC | PRN

## 2020-07-16 NOTE — Progress Notes (Signed)
Location:    [place] Room Number: 210/W Place of Service:  SNF (31) Provider:  Icie Kuznicki AGNP-C  [name], DO  Patient Care Team: [name], DO as PCP - General (Geriatric Medicine) Re[facility], [place] [facility] [place], Nelda Bucks, NP as Nurse Practitioner (Family Medicine) Carlyle Basques, MD as Consulting Physician (Infectious Diseases) Ala Bent, MD as Referring Physician (Neurology)  Extended Emergency Contact Information Primary Emergency Contact: [name] Home Phone: [phone] Mobile Phone: [phone] Relation: Sister Secondary Emergency Contact: [name] Home Phone: [phone] Mobile Phone: [phone] Relation: Niece  Code Status: Full Code  Goals of care: Advanced Directive information Advanced Directives 07/16/2020  Does Patient Have a Medical Advance Directive? Yes  Type of Advance Directive Out of facility DNR (pink MOST or yellow form)  Does patient want to make changes to medical advance directive? No - Patient declined  Copy of [place] in Chart? -  Pre-existing out of facility DNR order (yellow form or pink MOST form) Pink MOST form placed in chart (order not valid for inpatient use)     Chief Complaint  Patient presents with  . Follow-up    Follow Up Hospital Admission    HPI:  Pt is a 64 y.o. female seen today for a hospital follow up for acute metabolic encephalopathy. She is a resident of [facility], seen today at bedside. PMH includes: hypertension, irritable bowel syndrome, clostridium difficile diarrhea with recurrent colitis, primary progressive multiple sclerosis, stage IV decubitus sacral ulcer, osteomyelitis of coccyx, hyperlipidemia, depression, and paroxysmal anxiety disorder.   She was admitted to [facility] on 07/07/2020 for increased lethargy. Her admitting diagnosis was acute metabolic  encephalopathy. It was believed her hydrocodone for back pain associated with her sacral ulcer was the main cause for increased lethargy. During her hospitalization, hydrocodone and tizanidine were stopped. She was seen by palliative care and baclofen was started at a reduced dose, as well as, ultram with tylenol PRN.   In addition, CT abdomen and pelvis was done concerning her large sacral  decubitus ulcer. Comparisons from a previous CT done in February this year confirmed osteomyelitis of the coccyx. General surgery was consulted and surgical debridement was not recommended at this time. It was recommended that she continue Unasyn x 4 weeks, and continue routine follow up with infectious disease.  CT of abdomen and pelvis noted a possible defect of her right common femoral vein. She was started on Lovenox 40 mg subcutaneous daily. Doppler of lower extremities was also performed with negative result.   Urine cultures were positive for pseudomonas. It was determined by infectious disease to not treat at this time due to patients long history of recurrent C. Difficile.   She was discharged 07/13/2020 to [facility]. It was recommended she follow up with facility provider within 1 week and recheck bmp and magnesium level in 1 week.   Today, she is very happy to have company. She can recall her stay at the hospital, including some of the doctors she saw and which unit of the hospital she was on. States she does not feel "fuzzy" in the head. Prior to encounter she was seen by the wound nurse and turned for her dressing change. States she felt some discomfort, but believes pain is controlled with ultram, tylenol and baclofen. Her diarrhea has subsided at this time with oral vancomycin. Still upset about [employer] [facility]. She states they did not deliver lunch or dinner yesterday. They are also not keeping her pitcher filled with fresh water often.   Past Medical History:    Diagnosis Date  . Actinic keratosis   . BCC (basal cell carcinoma of skin)   . Closed fracture of second metatarsal bone of right foot   . Closed fracture of third metatarsal bone of right foot with routine healing   . IBS (irritable bowel syndrome)   . Multiple sclerosis ([place])   . Neurogenic bladder   . Rectal polyp    Tubular adenoma  . Recurrent UTI   . SCC (squamous cell carcinoma)    Past Surgical History:  Procedure Laterality Date  . BOTOX INJECTION    . BREAST BIOPSY    . COLONOSCOPY    . SKIN CANCER EXCISION      No Known Allergies  Allergies as of 07/16/2020   No Known Allergies     Medication List       Accurate as of July 16, 2020 10:20 AM. If you have any questions, ask your nurse or doctor.        STOP taking these medications   collagenase ointment Commonly known as: SANTYL Stopped by: Yvonna Alanis, NP   vancomycin 50 mg/mL  oral solution Commonly known as: VANCOCIN Stopped by: Yvonna Alanis, NP     TAKE these medications   acetaminophen 500 MG tablet Commonly known as: TYLENOL Take 2 tablets (1,000 mg total) by mouth 3 (three) times daily.   ampicillin-sulbactam  IVPB Commonly known as: UNASYN Inject 3 g into the vein every 6 (six) hours for 26 days. Indication:  Osteomyelitis of coccyx First Dose: Yes Last Day of Therapy:  08/05/2020 Labs - Once weekly:  CBC/D and BMP, Labs - Every other week:  ESR and CRP Method of administration: Mini-Bag Plus / Gravity Method of administration may be changed at the discretion of home infusion pharmacist based upon assessment of the patient and/or caregiver's ability to self-administer the medication ordered.   baclofen 20 MG tablet Commonly known as: LIORESAL Take 20 mg by mouth 3 (three) times daily. What changed: Another medication with the same name was removed. Continue taking this medication, and follow the directions you see here. Changed by: Yvonna Alanis, NP   baclofen 10 MG  tablet Commonly known as: LIORESAL Take 30 mg by mouth at bedtime. What changed: Another medication with the same name was removed. Continue taking this medication, and follow the directions you see here. Changed by: Yvonna Alanis, NP   BIOFREEZE EX Apply 5 % topically in the morning, at noon, in the evening, and at bedtime. Apply to Left Shoulder.   cetirizine 5 MG tablet Commonly known as: ZYRTEC Take 1 tablet (5 mg total) by mouth at bedtime.   Culturelle Caps Take by mouth. 10B Cell Capsule   dicyclomine 20 MG tablet Commonly known as: BENTYL Take 20 mg by mouth in the morning, at noon, in the evening, and at bedtime.   enoxaparin 40 MG/0.4ML injection Commonly known as: LOVENOX Inject 0.4 mLs (40 mg total) into the skin daily.   feeding supplement (PRO-STAT SUGAR FREE 64) Liqd Take 30 mLs by mouth in the morning and at bedtime.   ferrous sulfate 325 (65 FE) MG tablet Take 1 tablet (325 mg total) by mouth daily with breakfast.   FLUoxetine 40 MG capsule Commonly known as: PROZAC Take 1 capsule (40 mg total) by  mouth daily.   folic acid 1 MG tablet Commonly known as: FOLVITE Take 1 tablet (1 mg total) by mouth daily.   hydrocortisone cream 1 % Apply 1 application topically every 12 (twelve) hours as needed for itching.   JUVEN PO Take 1 each by mouth in the morning and at bedtime.   ENSURE CLEAR PO Take by mouth in the morning and at bedtime. D/t weight loss and wound healing   LORazepam 0.5 MG tablet Commonly known as: ATIVAN Take 1 tablet (0.5 mg total) by mouth daily as needed (Panic attack).   mirtazapine 7.5 MG tablet Commonly known as: REMERON Take 7.5 mg by mouth at bedtime.   NON FORMULARY Magic Cup with L/D meals d/t weight loss.   ondansetron 4 MG tablet Commonly known as: ZOFRAN Take 4 mg by mouth in the morning, at noon, and at bedtime. Nausea and Vomiting   polyethylene glycol 17 g packet Commonly known as: MIRALAX / GLYCOLAX Take 17 g by  mouth daily as needed for mild constipation.   potassium chloride 10 MEQ tablet Commonly known as: KLOR-CON Take 10 mEq by mouth daily.   pravastatin 40 MG tablet Commonly known as: PRAVACHOL Take 40 mg by mouth daily.   senna 8.6 MG Tabs tablet Commonly known as: SENOKOT Take 2 tablets (17.2 mg total) by mouth at bedtime.   tamsulosin 0.4 MG Caps capsule Commonly known as: FLOMAX Take 0.4 mg by mouth daily.   traMADol 50 MG tablet Commonly known as: ULTRAM Take 0.5 tablets (25 mg total) by mouth every 8 (eight) hours as needed for severe pain.   Vancomycin HCl 50 MG/ML Solr Take 2.5 mLs by mouth every 12 (twelve) hours. For 26 days   vitamin B-12 1000 MCG tablet Commonly known as: CYANOCOBALAMIN Take 1,000 mcg by mouth daily.   VITAMIN C PO Take 500 mg by mouth 2 (two) times daily.   Vitamin D (Cholecalciferol) 25 MCG (1000 UT) Tabs Take 5,000 Units by mouth daily.       Review of Systems  Constitutional: Negative for activity change, appetite change and unexpected weight change.  HENT: Negative for congestion and trouble swallowing.   Respiratory: Negative for cough and shortness of breath.   Cardiovascular: Negative for chest pain and leg swelling.  Gastrointestinal: Positive for diarrhea. Negative for abdominal pain, constipation and nausea.  Genitourinary: Negative for dysuria, frequency and hematuria.  Musculoskeletal: Positive for arthralgias, back pain and myalgias.       Multiple sclerosis  Skin:       Dry skin on extremities  Neurological: Positive for weakness and numbness. Negative for dizziness and headaches.  Psychiatric/Behavioral: Positive for decreased concentration. Negative for sleep disturbance. The patient is nervous/anxious.     Immunization History  Administered Date(s) Administered  . Influenza, High Dose Seasonal PF 07/01/2018  . Influenza,inj,Quad PF,6+ Mos 05/20/2013, 05/26/2014, 06/18/2015, 06/18/2016, 06/29/2017  .  Influenza-Unspecified 06/20/2019, 06/05/2020  . Moderna SARS-COVID-2 Vaccination 08/19/2019, 10/08/2019  . PPD Test 08/23/2019  . Td 08/18/1998  . Tdap 03/11/2012   Pertinent  Health Maintenance Due  Topic Date Due  . PAP SMEAR-Modifier  Never done  . MAMMOGRAM  Never done  . COLONOSCOPY  Never done  . INFLUENZA VACCINE  Completed   Fall Risk  05/31/2020 04/19/2020  Falls in the past year? 0 0  Risk for fall due to : No Fall Risks No Fall Risks  Follow up Falls evaluation completed Falls evaluation completed   Functional Status Survey:  Vitals:   07/16/20 0953  BP: (!) 117/59  Pulse: (!) 59  Resp: 16  Temp: (!) 97.2 F (36.2 C)  SpO2: 98%  Weight: 134 lb 14.7 oz (61.2 kg)  Height: _0  (1.651 m)   Body mass index is 22.45 kg/m. Physical Exam Vitals reviewed.  Constitutional:      General: She is not in acute distress.    Appearance: Normal appearance.  HENT:     Head: Normocephalic.     Right Ear: There is no impacted cerumen.     Left Ear: There is no impacted cerumen.     Nose: Nose normal.     Mouth/Throat:     Mouth: Mucous membranes are moist.     Pharynx: No posterior oropharyngeal erythema.  Eyes:     Extraocular Movements: Extraocular movements intact.     Pupils: Pupils are equal, round, and reactive to light.  Cardiovascular:     Rate and Rhythm: Normal rate and regular rhythm.     Pulses: Normal pulses.     Heart sounds: Normal heart sounds. No murmur heard.   Pulmonary:     Effort: Pulmonary effort is normal. No respiratory distress.     Breath sounds: Normal breath sounds. No wheezing.  Abdominal:     General: Abdomen is flat. Bowel sounds are normal. There is no distension.     Palpations: Abdomen is soft.     Tenderness: There is no abdominal tenderness.  Musculoskeletal:     Right foot: Decreased range of motion. Normal capillary refill. No tenderness.     Left foot: Decreased range of motion. Normal capillary refill. No tenderness.      Comments: Bilateral upper extremities contracted at elbow with limited ROM. Bilateral wrist strength 3/5. Bilateral dorsal flexion 2/5.   Skin:    General: Skin is dry.     Capillary Refill: Capillary refill takes less than 2 seconds.     Findings: No rash.     Comments: unstageable pressure injury to sacrum 6.0 x 4.50 x 2.0, granulation tissue 25%, slough 65%. No odor present on exam. Dryness noted on all extremities.  Neurological:     Mental Status: She is alert and oriented to person, place, and time.     Motor: Weakness present.     Gait: Gait abnormal.  Psychiatric:        Mood and Affect: Mood normal.        Behavior: Behavior normal.        Thought Content: Thought content normal.        Judgment: Judgment normal.     Labs reviewed: Recent Labs    07/08/20 0915 07/09/20 0410 07/10/20 0350 07/10/20 0350 07/11/20 0408 07/12/20 0430 07/13/20 0334  NA 140   < > 142   < > 144 144 144  K 3.2*   < > 3.2*   < > 3.6 3.8 3.6  CL 109   < > 110   < > 111 111 111  CO2 28   < > 24   < > _1 GLUCOSE 80   < > 129*   < > 79 84 82  BUN 10   < > 10   < > 6* 6* 6*  CREATININE 0.50   < > 0.66   < > 0.50 0.60 0.65  CALCIUM 7.9*   < > 7.8*   < > 8.1* 8.2* 8.1*  MG 1.7  --  2.0  --   --  2.1  --   PHOS 2.4*  --   --   --   --   --   --    < > = values in this interval not displayed.   Recent Labs    07/11/20 0408 07/12/20 0430 07/13/20 0334  AST 10* 14* 16  ALT _0 ALKPHOS 56 64 63  BILITOT 0.5 0.4 0.5  PROT 4.1* 4.6* 4.3*  ALBUMIN 1.6* 1.7* 1.6*   Recent Labs    07/07/20 0102 07/07/20 0102 07/07/20 0605 07/07/20 0605 07/08/20 0915 07/09/20 0410 07/11/20 0408 07/12/20 0430 07/13/20 0334  WBC 14.6*   < > 18.1*   < > 12.3*   < > 7.2 8.3 6.7  NEUTROABS 11.6*  --  14.3*  --  9.8*  --   --   --   --   HGB 7.8*   < > 8.9*   < > 8.1*   < > 7.6* 8.1* 7.8*  HCT 26.4*   < > 29.5*   < > 27.5*   < > 25.9* 27.2* 26.3*  MCV 85.7   < > 85.3   < > 84.9   < > 86.9 85.5  87.1  PLT 465*   < > 630*   < > 517*   < > 402* 418* 384   < > = values in this interval not displayed.   No results found for: TSH No results found for: HGBA1C Lab Results  Component Value Date   CHOL 163 05/23/2020   HDL 51 05/23/2020   LDLCALC 50 05/23/2020   TRIG 309 (A) 05/23/2020    Significant Diagnostic Results in last 30 days:  CT ABDOMEN PELVIS W CONTRAST  Result Date: 07/08/2020 CLINICAL DATA:  Abdominal abscess/infection suspected. Sacral wound infection. Back pain. EXAM: CT ABDOMEN AND PELVIS WITH CONTRAST TECHNIQUE: Multidetector CT imaging of the abdomen and pelvis was performed using the standard protocol following bolus administration of intravenous contrast. CONTRAST:  160m OMNIPAQUE IOHEXOL 300 MG/ML  SOLN COMPARISON:  CT dated 10/03/2019 FINDINGS: Lower chest: There are trace bilateral pleural effusions, left greater than right. There is bibasilar atelectasis, left worse than right.The heart size is normal. Hepatobiliary: The liver is normal. Normal gallbladder.There is no biliary ductal dilation. Pancreas: Normal contours without ductal dilatation. No peripancreatic fluid collection. Spleen: Unremarkable. Adrenals/Urinary Tract: --Adrenal glands: Unremarkable. --Right kidney/ureter: There is severe right-sided hydroureteronephrosis to the level of the urinary bladder. --Left kidney/ureter: There is severe left-sided hydronephrosis to the level of the urinary bladder. There are areas of cortical thinning and scarring. The left-sided collecting system is partially duplicated. --Urinary bladder: The urinary bladder is trabeculated with multiple bladder diverticula. There is diffuse bladder wall thickening with adjacent fat stranding. Stomach/Bowel: --Stomach/Duodenum: No hiatal hernia or other gastric abnormality. Normal duodenal course and caliber. --Small bowel: Unremarkable. --Colon: There is a large amount of stool throughout the colon. --Appendix: Normal. Vascular/Lymphatic:  Atherosclerotic calcification is present within the non-aneurysmal abdominal aorta, without hemodynamically significant stenosis. There is a questionable filling defect within the right common femoral vein (axial series 2, image 75). --No retroperitoneal lymphadenopathy. --No mesenteric lymphadenopathy. --No pelvic or inguinal lymphadenopathy. Reproductive: Unremarkable Other: There is mild body wall edema. There are few pockets of subcutaneous gas on the patient's low anterior left abdomen favored to represent sequela of a prior subcutaneous injection. Musculoskeletal. 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 There is presacral edema which is likely reactive. These  findings are new since the patient's CT dated 10/03/2019. IMPRESSION: 1. 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 These findings are new since the patient's CT dated 10/03/2019. 2. There is a questionable filling defect within the right common femoral vein, concerning for DVT. Recommend further evaluation with a dedicated right lower extremity venous Doppler ultrasound. 3. Severe bilateral hydroureteronephrosis to the level of the urinary bladder. This is likely secondary to chronic bladder outlet obstruction. 4. Diffuse bladder wall thickening with adjacent fat stranding is concerning for cystitis. Correlation with urinalysis is recommended. 5. Trace bilateral pleural effusions, left greater than right, with bibasilar atelectasis, left worse than right. 6. Large amount of stool throughout the colon. Aortic Atherosclerosis (ICD10-I70.0). Electronically Signed   By: Constance Holster M.D.   On: 07/08/2020 22:47   DG Chest Port 1 View  Result Date: 07/07/2020 CLINICAL DATA:  Possible overdose. EXAM: PORTABLE CHEST 1 VIEW COMPARISON:  January 19, 2020 FINDINGS: A right-sided PICC line is seen with its  distal tip noted at the junction of the superior vena cava and right atrium. There is no evidence of acute infiltrate. A small left pleural effusion is seen. No pneumothorax is identified. The heart size and mediastinal contours are within normal limits. The visualized skeletal structures are unremarkable. IMPRESSION: 1. Right-sided PICC line positioning, as described above. 2. Small left pleural effusion. Electronically Signed   By: Virgina Norfolk M.D.   On: 07/07/2020 02:40    Assessment/Plan 1. Multiple sclerosis, primary progressive ([place]) - stable at this time, continue PT/OT - continue current baclofen regimen for muscle spasms  2. Recurrent colitis due to Clostridium difficile - stable with no reported diarrhea -continue oral vancomycin 50 mg/ml - 125 mg Q12 hrs for 26 days  3. Acute encephalopathy - resolved at this time, believed to be related to narcotics - continue current regimen of tylenol, ultram and baclofen for pain - bmp and magnesium level today  4. Acute osteomyelitis of coccyx ([place]) - ongoing, surgical debridement not recommended at this time, continue Unasyn 3 g IV x 26 days - continue wound nurse care - continue frequent turning - continue nutritional supplements like ensure and juven to promote wound healing - next follow up with infectious disease 07/27/2020  5. Pain associated with wound - stable, goal is to limit use of narcotics to prevent increased lethargy - continue tylenol, ultram and baclofen for pain  6. Acquired abnormality of right femoral vein - doppler negative for DVT, so signs of DVT/PE at this time - continue lovenox 40 mg daily  7. Urine culture positive - stable, history of pseudomonas in past - antibiotics not recommended at this time    Family/ staff Communication: Plan discussed with patient and facility nurse  Labs/tests ordered:

## 2020-07-17 ENCOUNTER — Encounter: Payer: Self-pay | Admitting: Internal Medicine

## 2020-07-17 ENCOUNTER — Non-Acute Institutional Stay (SKILLED_NURSING_FACILITY): Payer: Medicare Other | Admitting: Internal Medicine

## 2020-07-17 DIAGNOSIS — K5900 Constipation, unspecified: Secondary | ICD-10-CM

## 2020-07-17 DIAGNOSIS — N319 Neuromuscular dysfunction of bladder, unspecified: Secondary | ICD-10-CM

## 2020-07-17 DIAGNOSIS — G35 Multiple sclerosis: Secondary | ICD-10-CM

## 2020-07-17 DIAGNOSIS — T148XXA Other injury of unspecified body region, initial encounter: Secondary | ICD-10-CM

## 2020-07-17 DIAGNOSIS — R8279 Other abnormal findings on microbiological examination of urine: Secondary | ICD-10-CM

## 2020-07-17 DIAGNOSIS — M8618 Other acute osteomyelitis, other site: Secondary | ICD-10-CM

## 2020-07-17 DIAGNOSIS — L89154 Pressure ulcer of sacral region, stage 4: Secondary | ICD-10-CM | POA: Diagnosis not present

## 2020-07-17 DIAGNOSIS — F410 Panic disorder [episodic paroxysmal anxiety] without agoraphobia: Secondary | ICD-10-CM

## 2020-07-17 DIAGNOSIS — M4628 Osteomyelitis of vertebra, sacral and sacrococcygeal region: Secondary | ICD-10-CM

## 2020-07-17 DIAGNOSIS — A0471 Enterocolitis due to Clostridium difficile, recurrent: Secondary | ICD-10-CM

## 2020-07-17 DIAGNOSIS — G9340 Encephalopathy, unspecified: Secondary | ICD-10-CM | POA: Diagnosis not present

## 2020-07-17 DIAGNOSIS — R52 Pain, unspecified: Secondary | ICD-10-CM

## 2020-07-17 DIAGNOSIS — D508 Other iron deficiency anemias: Secondary | ICD-10-CM

## 2020-07-17 LAB — BASIC METABOLIC PANEL
BUN: 20 (ref 4–21)
CO2: 23 — AB (ref 13–22)
Chloride: 113 — AB (ref 99–108)
Creatinine: 0.6 (ref 0.5–1.1)
Glucose: 77
Potassium: 4.2 (ref 3.4–5.3)
Sodium: 145 (ref 137–147)

## 2020-07-17 LAB — CBC AND DIFFERENTIAL
HCT: 33 — AB (ref 36–46)
Hemoglobin: 10.3 — AB (ref 12.0–16.0)
Platelets: 379 (ref 150–399)
WBC: 10.6

## 2020-07-17 LAB — COMPREHENSIVE METABOLIC PANEL
Calcium: 8 — AB (ref 8.7–10.7)
GFR calc Af Amer: >90
GFR calc non Af Amer: >90

## 2020-07-17 LAB — CBC: RBC: 3.99 (ref 3.87–5.11)

## 2020-07-17 NOTE — Progress Notes (Signed)
Provider:  Rexene Edison. Mariea Clonts, D.O., C.M.D. Location:  [facility] Room Number: [place] of Service:    SNF  PCP: [name], DO Patient Care Team: [name], DO as PCP - General (Geriatric Medicine) Re[facility], [place] [facility] [place], Nelda Bucks, NP as Nurse Practitioner (Family Medicine) Carlyle Basques, MD as Consulting Physician (Infectious Diseases) Ala Bent, MD as Referring Physician (Neurology)  Extended Emergency Contact Information Primary Emergency Contact: [name] Home Phone: [phone] Mobile Phone: [phone] Relation: Sister Secondary Emergency Contact: [name] Home Phone: [phone] Mobile Phone: [phone] Relation: Niece  Code Status: FULL Goals of Care: Advanced Directive information Advanced Directives 07/18/2020  Does Patient Have a Medical Advance Directive? Yes  Type of Advance Directive -  Does patient want to make changes to medical advance directive? No - Patient declined  Copy of [place] in Chart? -  Pre-existing out of facility DNR order (yellow form or pink MOST form) -   Chief Complaint  Patient presents with  . Readmit To SNF    S/P lethargy, osteomyelitis at [facility]     HPI: Patient is a 64 y.o. female seen today for readmission to [facility] and Rehab status post hospitalization from [facility] for lethargy.  Kaithlyn has a past medical history significant for multiple sclerosis, refractory C. difficile colitis, neurogenic bladder with indwelling Foley catheter, irritable bowel syndrome, and recurrent urinary tract infections.  Here at SNF, she had developed a sacral pressure injury.  This was unstageable due to granulation tissue present.  Over the past couple of weeks the wound was noted to have an odor and some surrounding erythema plus she was having more pain in her lower back and buttocks area.  For that she had been started on  the hydrocodone in place of tramadol because of intense pain during her dressing changes.  Unfortunately, she did not tolerate hydrocodone well and it made her drowsy.  Staff wind up sending her to the hospital due to increased confusion and lethargy.  She had also just been started on antibiotics with Unasyn and oral vancomycin for suspected osteomyelitis because bone could be probed in her sacrum.  She had an elevated sed rate, CRP and leukocytosis.    In the emergency department she is mildly lethargic but able to answer questions appropriately and give a good history.  UA of course was concerning for urinary tract infection.  She did have one episode of loose stool in the emergency department.  This did show worsening anemia with a hemoglobin of 7 with a baseline of 9.  She was started on ceftriaxone for urinary tract infection and admitted for her acute encephalopathy.  Covid testing was negative.  I had reached out to the attending hospitalist and advised him of the ongoing therapy for her osteomyelitis as well as her refractory C. difficile colitis.  Dr. Baxter Flattery from infectious disease had already provided assistance with determining her antibiotic regimen.    During her delirium work-up, ammonia level, B12 level and HIV tests were normal.  Her Norco and tizanidine were discontinued during her hospitalization but baclofen continued.  Dose was reduced due to lethargy.  She did have clinical improvement off hydrocodone.  Patient was seen by palliative care and patient's pain was controlled with Ultram and as needed Tylenol.  CT of the abdomen and pelvis was consistent with osteomyelitis of the coccyx has suspected.  Facility.  General surgery did  not recommend any surgical debridement.  She will continue on the IV Unasyn with a PICC line in place for 4 weeks per the ID recommendations.  Her white blood cell count did normalize during hospitalization.  She is to follow-up with Dr. Baxter Flattery December 13.   She had an abnormal right common femoral vein with possible filling defect noted on the CT but lower extremity Dopplers were negative for DVT.  She was placed on Lovenox DVT prophylaxis during her stay and returns here to the facility with this.  I've reached out to the hospitalist to address the stop date.  She was also found to have severe bilateral hydroureteronephrosis felt to be due to chronic bladder outlet obstruction.  She was maintained on her home Flomax and is to follow-up with urology outpatient.  Her urine culture did grow out Pseudomonas this was felt to be due to colonization.  Also it was felt inappropriate to treat this considering her underlying C. Difficile.    Recurrent and seemingly refractory C. difficile colitis.  She was maintained on enteric precautions and continued on her oral vancomycin she is on extended dose and it was changed per ID to twice daily.  Her iron and folate deficiency normocytic anemia was stable at 8.1.  She had hypoalbuminemia from poor nutrition and was continued on supplementation.  She is maintained on her statin for hyperlipidemia  She is to follow-up with her outpatient neurologist for her primary progressive multiple sclerosis.  She is not on treatment because this actually brought on one of her C. difficile infections.  She was noted to have a large amount of stool on her CT scan and her MiraLAX was changed to as needed after she had multiple bowel movements.  She was noted to have trace bilateral pleural effusions on her imaging but remained asymptomatic.  When seen today, I visited her with the wound care nurse.  Dajha refused to accept her lovenox injection from her and said she "did not want to put anything into her body".  When we assessed her orientation, she told the nurse she was at the nurse's home and that the nurse had driven them there.  She just wanted to get better and go back home.  At that time, she wanted to rest and told us to  get out of her room.  She refused her examination.  Past Medical History:  Diagnosis Date  . Actinic keratosis   . BCC (basal cell carcinoma of skin)   . Closed fracture of second metatarsal bone of right foot   . Closed fracture of third metatarsal bone of right foot with routine healing   . IBS (irritable bowel syndrome)   . Multiple sclerosis ([place])   . Neurogenic bladder   . Rectal polyp    Tubular adenoma  . Recurrent UTI   . SCC (squamous cell carcinoma)    Past Surgical History:  Procedure Laterality Date  . BOTOX INJECTION    . BREAST BIOPSY    . COLONOSCOPY    . SKIN CANCER EXCISION      Social History   Socioeconomic History  . Marital status: Divorced    Spouse name: Not on file  . Number of children: Not on file  . Years of education: Not on file  . Highest education level: Not on file  Occupational History  . Not on file  Tobacco Use  . Smoking status: Never Smoker  . Smokeless tobacco: Never Used  Vaping Use  .  Vaping Use: Never used  Substance and Sexual Activity  . Alcohol use: Yes    Alcohol/week: 18.0 standard drinks    Types: 2 Glasses of wine, 16 Standard drinks or equivalent per week  . Drug use: Never  . Sexual activity: Not on file  Other Topics Concern  . Not on file  Social History Narrative   Divorced.  No children. Member of [employer] and has a good support system there. Never smoker. 16.7 standard drinks - two 5 oz glasses of wine per week.  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    Social Determinants of Health   Financial Resource Strain:   . Difficulty of Paying Living Expenses: Not on file  Food Insecurity:   . Worried About Charity fundraiser in the Last Year: Not on file  . Ran Out of Food in the Last Year: Not on file  Transportation Needs:   . Lack of Transportation (Medical): Not on file  . Lack of Transportation (Non-Medical): Not on  file  Physical Activity:   . Days of Exercise per Week: Not on file  . Minutes of Exercise per Session: Not on file  Stress:   . Feeling of Stress : Not on file  Social Connections:   . Frequency of Communication with Friends and Family: Not on file  . Frequency of Social Gatherings with Friends and Family: Not on file  . Attends Religious Services: Not on file  . Active Member of Clubs or Organizations: Not on file  . Attends Archivist Meetings: Not on file  . Marital Status: Not on file    reports that she has never smoked. She has never used smokeless tobacco. She reports current alcohol use of about 18.0 standard drinks of alcohol per week. She reports that she does not use drugs.  Functional Status Survey:    Family History  Problem Relation Age of Onset  . Hypertension Mother   . Hyperlipidemia Mother   . Breast cancer Mother   . Hypertension Father   . Heart attack Father   . Hypertension Brother   . Diabetes Brother   . Lung cancer Maternal Grandmother   . Heart attack Paternal Grandfather     Health Maintenance  Topic Date Due  . Hepatitis C Screening  Never done  . PAP SMEAR-Modifier  Never done  . MAMMOGRAM  Never done  . COLONOSCOPY  Never done  . TETANUS/TDAP  03/11/2022  . INFLUENZA VACCINE  Completed  . COVID-19 Vaccine  Completed  . HIV Screening  Completed    No Known Allergies  Outpatient Encounter Medications as of 07/17/2020  Medication Sig  . acetaminophen (TYLENOL) 500 MG tablet Take 2 tablets (1,000 mg total) by mouth 3 (three) times daily.  . Amino Acids-Protein Hydrolys (FEEDING SUPPLEMENT, PRO-STAT SUGAR FREE 64,) LIQD Take 30 mLs by mouth in the morning and at bedtime.  Marland Kitchen ampicillin-sulbactam (UNASYN) IVPB Inject 3 g into the vein every 6 (six) hours for 26 days. Indication:  Osteomyelitis of coccyx First Dose: Yes Last Day of Therapy:  08/05/2020 Labs - Once weekly:  CBC/D and BMP, Labs - Every other week:  ESR and CRP  Method of administration: Mini-Bag Plus / Gravity Method of administration may be changed at the discretion of home infusion pharmacist based upon assessment of the patient and/or caregiver's ability to self-administer the medication ordered.  . Ascorbic Acid (VITAMIN C  PO) Take 500 mg by mouth 2 (two) times daily.  . baclofen (LIORESAL) 10 MG tablet Take 30 mg by mouth at bedtime.  . baclofen (LIORESAL) 20 MG tablet Take 20 mg by mouth 3 (three) times daily.  . cetirizine (ZYRTEC) 5 MG tablet Take 1 tablet (5 mg total) by mouth at bedtime.  . dicyclomine (BENTYL) 20 MG tablet Take 20 mg by mouth in the morning, at noon, in the evening, and at bedtime.   . enoxaparin (LOVENOX) 40 MG/0.4ML injection Inject 0.4 mLs (40 mg total) into the skin daily.  . ferrous sulfate 325 (65 FE) MG tablet Take 1 tablet (325 mg total) by mouth daily with breakfast.  . FLUoxetine (PROZAC) 40 MG capsule Take 1 capsule (40 mg total) by mouth daily.  . folic acid (FOLVITE) 1 MG tablet Take 1 tablet (1 mg total) by mouth daily.  . hydrocortisone cream 1 % Apply 1 application topically every 12 (twelve) hours as needed for itching.   . Lactobacillus Rhamnosus, GG, (CULTURELLE) CAPS Take by mouth. 10B Cell Capsule  . LORazepam (ATIVAN) 0.5 MG tablet Take 1 tablet (0.5 mg total) by mouth daily as needed (Panic attack).  . Menthol, Topical Analgesic, (BIOFREEZE EX) Apply 5 % topically in the morning, at noon, in the evening, and at bedtime. Apply to Left Shoulder.  . mirtazapine (REMERON) 7.5 MG tablet Take 7.5 mg by mouth at bedtime.  . NON FORMULARY Magic Cup with L/D meals d/t weight loss.  . Nutritional Supplements (JUVEN PO) Take 1 each by mouth in the morning and at bedtime.  . ondansetron (ZOFRAN) 4 MG tablet Take 4 mg by mouth in the morning, at noon, and at bedtime. Nausea and Vomiting  . polyethylene glycol (MIRALAX / GLYCOLAX) 17 g packet Take 17 g by mouth daily as needed for mild constipation.  . potassium  chloride (KLOR-CON) 10 MEQ tablet Take 10 mEq by mouth daily.   . pravastatin (PRAVACHOL) 40 MG tablet Take 40 mg by mouth daily.  Marland Kitchen senna (SENOKOT) 8.6 MG TABS tablet Take 2 tablets (17.2 mg total) by mouth at bedtime.  . tamsulosin (FLOMAX) 0.4 MG CAPS capsule Take 0.4 mg by mouth daily.  . traMADol (ULTRAM) 50 MG tablet Take 0.5 tablets (25 mg total) by mouth every 8 (eight) hours as needed for severe pain.  . Vancomycin HCl 50 MG/ML SOLR Take 2.5 mLs by mouth every 12 (twelve) hours. For 26 days  . vitamin B-12 (CYANOCOBALAMIN) 1000 MCG tablet Take 1,000 mcg by mouth daily.  . Vitamin D, Cholecalciferol, 25 MCG (1000 UT) TABS Take 5,000 Units by mouth daily.   . [DISCONTINUED] Nutritional Supplements (ENSURE CLEAR PO) Take by mouth in the morning and at bedtime. D/t weight loss and wound healing   No facility-administered encounter medications on file as of 07/17/2020.    Review of Systems  Constitutional: Positive for malaise/fatigue. Negative for chills and fever.  HENT: Negative for congestion and sore throat.   Eyes: Negative for blurred vision.  Respiratory: Negative for cough and shortness of breath.   Cardiovascular: Negative for chest pain, palpitations and leg swelling.  Gastrointestinal: Negative for abdominal pain, constipation and diarrhea.  Genitourinary: Negative for dysuria.  Musculoskeletal: Negative for myalgias.       Not c/o buttock pain  Skin:       Stage 4 pressure injury buttock/sacrum  Neurological: Positive for weakness. Negative for dizziness and loss of consciousness.  Endo/Heme/Allergies: Does not bruise/bleed easily.  Psychiatric/Behavioral: Positive for  depression. The patient is nervous/anxious. The patient does not have insomnia.        Confusion    Vitals:   07/17/20 1448  BP: (!) 90/57  Pulse: 90  Temp: 97.7 F (36.5 C)  Weight: 134 lb 14.7 oz (61.2 kg)  Height: 5' 5"  (1.651 m)   Body mass index is 22.45 kg/m. Physical Exam Vitals  reviewed.  Constitutional:      General: She is not in acute distress.    Comments: Tired but able to converse (but reports wanting to rest and be left alone)  HENT:     Head: Normocephalic and atraumatic.  Eyes:     Conjunctiva/sclera: Conjunctivae normal.  Pulmonary:     Effort: Pulmonary effort is normal.  Skin:    Coloration: Skin is pale.     Comments: Has stage IV pressure injury to buttock/sacrum but she did not permit evaluation today and wanted wound care nurse and I to "get out and leave her alone so I can rest, get better and get home"  Neurological:     Motor: Weakness present.     Gait: Gait abnormal.     Comments: Disoriented to place and person, easily awakens and able to converse with [place]  Psychiatric:     Comments: Angry and upset today     Labs reviewed: Basic Metabolic Panel: Recent Labs    07/08/20 0915 07/09/20 0410 07/10/20 0350 07/10/20 0350 07/11/20 0408 07/11/20 0408 07/12/20 0430 07/12/20 0430 07/13/20 0334 07/16/20 0000 07/17/20 0000  NA 140   < > 142   < > 144   < > 144   < > 144 147 145  K 3.2*   < > 3.2*   < > 3.6   < > 3.8   < > 3.6 3.8 4.2  CL 109   < > 110   < > 111   < > 111   < > 111 114* 113*  CO2 28   < > 24   < > 27   < > 26   < > 26 26* 23*  GLUCOSE 80   < > 129*   < > 79  --  84  --  82  --   --   BUN 10   < > 10   < > 6*   < > 6*   < > 6* 22* 20  CREATININE 0.50   < > 0.66   < > 0.50   < > 0.60   < > 0.65 0.5 0.6  CALCIUM 7.9*   < > 7.8*   < > 8.1*   < > 8.2*   < > 8.1* 8.1* 8.0*  MG 1.7  --  2.0  --   --   --  2.1  --   --   --   --   PHOS 2.4*  --   --   --   --   --   --   --   --   --   --    < > = values in this interval not displayed.   Liver Function Tests: Recent Labs    07/11/20 0408 07/12/20 0430 07/13/20 0334  AST 10* 14* 16  ALT 7 9 9   ALKPHOS 56 64 63  BILITOT 0.5 0.4 0.5  PROT 4.1* 4.6* 4.3*  ALBUMIN 1.6* 1.7* 1.6*   Recent Labs    10/01/19 2151  LIPASE 38   Recent Labs  07/07/20 0605   AMMONIA 13   CBC: Recent Labs    07/07/20 0102 07/07/20 0102 07/07/20 6468 07/07/20 0605 07/08/20 0915 07/09/20 0410 07/11/20 0408 07/11/20 0408 07/12/20 0430 07/12/20 0430 07/13/20 0334 07/16/20 0000 07/17/20 0000  WBC 14.6*   < > 18.1*   < > 12.3*   < > 7.2   < > 8.3   < > 6.7 7.9 10.6  NEUTROABS 11.6*  --  14.3*  --  9.8*  --   --   --   --   --   --   --   --   HGB 7.8*   < > 8.9*   < > 8.1*   < > 7.6*   < > 8.1*   < > 7.8* 8.0* 10.3*  HCT 26.4*   < > 29.5*   < > 27.5*   < > 25.9*   < > 27.2*   < > 26.3* 25* 33*  MCV 85.7   < > 85.3   < > 84.9   < > 86.9  --  85.5  --  87.1  --   --   PLT 465*   < > 630*   < > 517*   < > 402*   < > 418*   < > 384 355 379   < > = values in this interval not displayed.   Cardiac Enzymes: No results for input(s): CKTOTAL, CKMB, CKMBINDEX, TROPONINI in the last 8760 hours. BNP: Invalid input(s): POCBNP No results found for: HGBA1C No results found for: TSH Lab Results  Component Value Date   VITAMINB12 2,488 (H) 07/07/2020   Lab Results  Component Value Date   FOLATE 4.0 (L) 07/07/2020   Lab Results  Component Value Date   IRON 19 (L) 07/07/2020   TIBC 120 (L) 07/07/2020   FERRITIN 346 (H) 07/07/2020    Imaging and Procedures obtained prior to SNF admission: CT ABDOMEN PELVIS W CONTRAST  Result Date: 07/08/2020 CLINICAL DATA:  Abdominal abscess/infection suspected. Sacral wound infection. Back pain. EXAM: CT ABDOMEN AND PELVIS WITH CONTRAST TECHNIQUE: Multidetector CT imaging of the abdomen and pelvis was performed using the standard protocol following bolus administration of intravenous contrast. CONTRAST:  159m OMNIPAQUE IOHEXOL 300 MG/ML  SOLN COMPARISON:  CT dated 10/03/2019 FINDINGS: Lower chest: There are trace bilateral pleural effusions, left greater than right. There is bibasilar atelectasis, left worse than right.The heart size is normal. Hepatobiliary: The liver is normal. Normal gallbladder.There is no biliary ductal  dilation. Pancreas: Normal contours without ductal dilatation. No peripancreatic fluid collection. Spleen: Unremarkable. Adrenals/Urinary Tract: --Adrenal glands: Unremarkable. --Right kidney/ureter: There is severe right-sided hydroureteronephrosis to the level of the urinary bladder. --Left kidney/ureter: There is severe left-sided hydronephrosis to the level of the urinary bladder. There are areas of cortical thinning and scarring. The left-sided collecting system is partially duplicated. --Urinary bladder: The urinary bladder is trabeculated with multiple bladder diverticula. There is diffuse bladder wall thickening with adjacent fat stranding. Stomach/Bowel: --Stomach/Duodenum: No hiatal hernia or other gastric abnormality. Normal duodenal course and caliber. --Small bowel: Unremarkable. --Colon: There is a large amount of stool throughout the colon. --Appendix: Normal. Vascular/Lymphatic: Atherosclerotic calcification is present within the non-aneurysmal abdominal aorta, without hemodynamically significant stenosis. There is a questionable filling defect within the right common femoral vein (axial series 2, image 75). --No retroperitoneal lymphadenopathy. --No mesenteric lymphadenopathy. --No pelvic or inguinal lymphadenopathy. Reproductive: Unremarkable Other: There is mild body wall edema. There are few pockets of subcutaneous  gas on the patient's low anterior left abdomen favored to represent sequela of a prior subcutaneous injection. Musculoskeletal. 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 There is presacral edema which is likely reactive. These findings are new since the patient's CT dated 10/03/2019. IMPRESSION: 1. 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 These findings are new since the patient's CT dated 10/03/2019. 2. There is a questionable filling  defect within the right common femoral vein, concerning for DVT. Recommend further evaluation with a dedicated right lower extremity venous Doppler ultrasound. 3. Severe bilateral hydroureteronephrosis to the level of the urinary bladder. This is likely secondary to chronic bladder outlet obstruction. 4. Diffuse bladder wall thickening with adjacent fat stranding is concerning for cystitis. Correlation with urinalysis is recommended. 5. Trace bilateral pleural effusions, left greater than right, with bibasilar atelectasis, left worse than right. 6. Large amount of stool throughout the colon. Aortic Atherosclerosis (ICD10-I70.0). Electronically Signed   By: Constance Holster M.D.   On: 07/08/2020 22:47   DG Chest Port 1 View  Result Date: 07/07/2020 CLINICAL DATA:  Possible overdose. EXAM: PORTABLE CHEST 1 VIEW COMPARISON:  January 19, 2020 FINDINGS: A right-sided PICC line is seen with its distal tip noted at the junction of the superior vena cava and right atrium. There is no evidence of acute infiltrate. A small left pleural effusion is seen. No pneumothorax is identified. The heart size and mediastinal contours are within normal limits. The visualized skeletal structures are unremarkable. IMPRESSION: 1. Right-sided PICC line positioning, as described above. 2. Small left pleural effusion. Electronically Signed   By: Virgina Norfolk M.D.   On: 07/07/2020 02:40    Assessment/Plan 1. Acute encephalopathy -said to have resolved with change from norco to tramadol for pain mgt and reduction of muscle relaxants; however, she is overtly delirious today, as well -she has been through several transitions here lately, intake remains poor and she has at least one severe infection with her osteomyelitis -had colonization vs infection of urine and also has had refractory c diff for which she never could get monoclonal ab infusion due to her insurance coverage gap not permitting patient assistance coverage (zinplava)   2. Acute osteomyelitis of coccyx ([place]) -continue course of antibiotics, will start IVFs if labs return and warrant this -cont regular checks of cbc, bmp, esr and crp per ID recs and f/u with Dr. Baxter Flattery as planned  3. Sacral decubitus ulcer, stage IV (HCC) -continue alternating pressure mattress, current wound care--wound has been looking really good per [facility] (pt did not permit exam today)  4. Pain associated with wound -seems this is doing fine and was prior to admission with the other regimen, also -not c/o pain during dressing changes now -continue revised pain regimen though delirium persists or has returned  5. Multiple sclerosis, primary progressive ([place]) -before her c diff recurred again, she had been making great strides with therapy, was able to be out of isolation and spirits were much better--seemed like the old Merelin was back (also with antidepressant increase); however, after that, she developed her wound and has been declining  6. Recurrent colitis due to Clostridium difficile -completing oral vanc course which was adjusted to bid at the hospital -has not been having diarrhea  7. Panic attack -cont lorazapem for these  8. Urine culture positive -was felt to be colonization with pseudomonas and tx not recommended at hospital in  view of no localizating symptoms (has catheter) and her c diff -encourage fluids orally and may need IV if not drinking enough fluids   9. Constipation, unspecified constipation type -actually was constipated and needed some miralax at the hospital, but no longer the case here, hold for any loose stools (was probably due to opioid therapy and poor intake)  10. Iron deficiency anemia secondary to inadequate dietary iron intake -cont b12, folate and dietary supplements  11. Neurogenic bladder -cont foley with regular changes per facility protocol  Family/ staff Communication: d/w snf nurse, DON, wound care nurse, CNAs   Labs/tests ordered:   Cbc, bmp   Chrstopher Malenfant L. Fantasy Donald, D.O. [facility] [address], [place] [address] Cell Phone (Mon-Fri 8am-5pm):  [phone] On Call:  [phone] & follow prompts after 5pm & weekends Office Phone:  [phone] Office Fax:  [phone]

## 2020-07-18 ENCOUNTER — Non-Acute Institutional Stay (SKILLED_NURSING_FACILITY): Payer: Medicare Other | Admitting: Orthopedic Surgery

## 2020-07-18 ENCOUNTER — Encounter: Payer: Self-pay | Admitting: Orthopedic Surgery

## 2020-07-18 DIAGNOSIS — R410 Disorientation, unspecified: Secondary | ICD-10-CM

## 2020-07-18 DIAGNOSIS — G9340 Encephalopathy, unspecified: Secondary | ICD-10-CM | POA: Diagnosis not present

## 2020-07-18 NOTE — Progress Notes (Signed)
Location:    [facility] Room Number: 210/W Place of Service:  SNF (31) Provider: Windell Moulding NP   [name], DO  Patient Care Team: [name], DO as PCP - General (Geriatric Medicine) Rehab, [facility] ([place]) Stallings, Nelda Bucks, NP as Nurse Practitioner (Family Medicine) Carlyle Basques, MD as Consulting Physician (Infectious Diseases) Ala Bent, MD as Referring Physician (Neurology)  Extended Emergency Contact Information Primary Emergency Contact: [name] Home Phone: [phone] Mobile Phone: [phone] Relation: Sister Secondary Emergency Contact: [name] Home Phone: [phone] Mobile Phone: [phone] Relation: Niece  Code Status: Full Code  Goals of care: Advanced Directive information Advanced Directives 07/18/2020  Does Patient Have a Medical Advance Directive? Yes  Type of Advance Directive -  Does patient want to make changes to medical advance directive? No - Patient declined  Copy of [place] in Chart? -  Pre-existing out of facility DNR order (yellow form or pink MOST form) -     Chief Complaint  Patient presents with  . Acute Visit    Lethargic    HPI:  Pt is a 64 y.o. female seen today for an acute visit for delirium.   She is a resident of [facility], seen at bedside today. PMH includes: multiple sclerosis, recurrent colitis, irritable bowel syndrome, osteomyelitis of coccyx, recurrent urinary tract infections, and neurogenic bladder with indwelling catheter. She was recently hospitalized for acute encephalopathy. It was suggested scheduled hydrocodone for pain was main cause.   Today facility nurse reports she is lethargic and confused. She did not eat breakfast this morning. Physical therapy reported the same concerns. She refused her physical therapy session this morning.   During our encounter, Leesa barely  opened her eyes. When her eyes were closed, she thought she was at [facility]. I asked her to open her eyes, and she knew she was in her room at [facility].She stated the year was 2021, knew Thanksgiving had just passed. While talking with her, she grew more agitated with each question I asked. She kept saying "don't you people talk with each other." In addition, she kept asking if she was receiving hydrocodone and was scared of overdosing again. When listening to her lung sounds she stated "stop talking to me like a baby."   Prior to visit she was talking with social work. Social worker reports family may transfer her to [facility]. When asked how she felt being closer to family she responded " I have never been there and do not know what it would be like."   Past Medical History:  Diagnosis Date  . Actinic keratosis   . BCC (basal cell carcinoma of skin)   . Closed fracture of second metatarsal bone of right foot   . Closed fracture of third metatarsal bone of right foot with routine healing   . IBS (irritable bowel syndrome)   . Multiple sclerosis ([place])   . Neurogenic bladder   . Rectal polyp    Tubular adenoma  . Recurrent UTI   . SCC (squamous cell carcinoma)    Past Surgical History:  Procedure Laterality Date  . BOTOX INJECTION    . BREAST BIOPSY    . COLONOSCOPY    . SKIN CANCER EXCISION      No Known Allergies  Allergies as of 07/18/2020   No Known Allergies     Medication List  Accurate as of July 18, 2020 11:15 AM. If you have any questions, ask your nurse or doctor.        acetaminophen 500 MG tablet Commonly known as: TYLENOL Take 2 tablets (1,000 mg total) by mouth 3 (three) times daily.   ampicillin-sulbactam  IVPB Commonly known as: UNASYN Inject 3 g into the vein every 6 (six) hours for 26 days. Indication:  Osteomyelitis of coccyx First Dose: Yes Last Day of Therapy:  08/05/2020 Labs - Once weekly:  CBC/D and BMP, Labs - Every other  week:  ESR and CRP Method of administration: Mini-Bag Plus / Gravity Method of administration may be changed at the discretion of home infusion pharmacist based upon assessment of the patient and/or caregiver's ability to self-administer the medication ordered.   baclofen 20 MG tablet Commonly known as: LIORESAL Take 20 mg by mouth 3 (three) times daily.   baclofen 10 MG tablet Commonly known as: LIORESAL Take 30 mg by mouth at bedtime.   BIOFREEZE EX Apply 5 % topically in the morning, at noon, in the evening, and at bedtime. Apply to Left Shoulder.   cetirizine 5 MG tablet Commonly known as: ZYRTEC Take 1 tablet (5 mg total) by mouth at bedtime.   Culturelle Caps Take by mouth. 10B Cell Capsule   dicyclomine 20 MG tablet Commonly known as: BENTYL Take 20 mg by mouth in the morning, at noon, in the evening, and at bedtime.   enoxaparin 40 MG/0.4ML injection Commonly known as: LOVENOX Inject 0.4 mLs (40 mg total) into the skin daily.   feeding supplement (PRO-STAT SUGAR FREE 64) Liqd Take 30 mLs by mouth in the morning and at bedtime.   ferrous sulfate 325 (65 FE) MG tablet Take 1 tablet (325 mg total) by mouth daily with breakfast.   FLUoxetine 40 MG capsule Commonly known as: PROZAC Take 1 capsule (40 mg total) by mouth daily.   folic acid 1 MG tablet Commonly known as: FOLVITE Take 1 tablet (1 mg total) by mouth daily.   hydrocortisone cream 1 % Apply 1 application topically every 12 (twelve) hours as needed for itching.   JUVEN PO Take 1 each by mouth in the morning and at bedtime. What changed: Another medication with the same name was removed. Continue taking this medication, and follow the directions you see here. Changed by: Yvonna Alanis, NP   LORazepam 0.5 MG tablet Commonly known as: ATIVAN Take 1 tablet (0.5 mg total) by mouth daily as needed (Panic attack).   mirtazapine 7.5 MG tablet Commonly known as: REMERON Take 7.5 mg by mouth at bedtime.     NON FORMULARY Magic Cup with L/D meals d/t weight loss.   ondansetron 4 MG tablet Commonly known as: ZOFRAN Take 4 mg by mouth in the morning, at noon, and at bedtime. Nausea and Vomiting   polyethylene glycol 17 g packet Commonly known as: MIRALAX / GLYCOLAX Take 17 g by mouth daily as needed for mild constipation.   potassium chloride 10 MEQ tablet Commonly known as: KLOR-CON Take 10 mEq by mouth daily.   pravastatin 40 MG tablet Commonly known as: PRAVACHOL Take 40 mg by mouth daily.   senna 8.6 MG Tabs tablet Commonly known as: SENOKOT Take 2 tablets (17.2 mg total) by mouth at bedtime.   tamsulosin 0.4 MG Caps capsule Commonly known as: FLOMAX Take 0.4 mg by mouth daily.   traMADol 50 MG tablet Commonly known as: ULTRAM Take 0.5 tablets (25 mg total) by mouth  every 8 (eight) hours as needed for severe pain.   Vancomycin HCl 50 MG/ML Solr Take 2.5 mLs by mouth every 12 (twelve) hours. For 26 days   vitamin B-12 1000 MCG tablet Commonly known as: CYANOCOBALAMIN Take 1,000 mcg by mouth daily.   VITAMIN C PO Take 500 mg by mouth 2 (two) times daily.   Vitamin D (Cholecalciferol) 25 MCG (1000 UT) Tabs Take 5,000 Units by mouth daily.       Review of Systems  Constitutional: Positive for fatigue. Negative for activity change, appetite change and unexpected weight change.  Respiratory: Negative for cough and shortness of breath.   Cardiovascular: Negative for chest pain and leg swelling.  Psychiatric/Behavioral: Positive for agitation, confusion and dysphoric mood. Negative for sleep disturbance. The patient is nervous/anxious.     Immunization History  Administered Date(s) Administered  . Influenza, High Dose Seasonal PF 07/01/2018  . Influenza,inj,Quad PF,6+ Mos 05/20/2013, 05/26/2014, 06/18/2015, 06/18/2016, 06/29/2017  . Influenza-Unspecified 06/20/2019, 06/05/2020, 06/05/2020  . Moderna SARS-COVID-2 Vaccination 08/19/2019, 10/08/2019  . PPD Test  08/23/2019  . Td 08/18/1998  . Tdap 03/11/2012   Pertinent  Health Maintenance Due  Topic Date Due  . PAP SMEAR-Modifier  Never done  . MAMMOGRAM  Never done  . COLONOSCOPY  Never done  . INFLUENZA VACCINE  Completed   Fall Risk  05/31/2020 04/19/2020  Falls in the past year? 0 0  Risk for fall due to : No Fall Risks No Fall Risks  Follow up Falls evaluation completed Falls evaluation completed   Functional Status Survey:    Vitals:   07/18/20 1102  BP: 126/71  Pulse: 81  Resp: 19  Temp: 97.7 F (36.5 C)  SpO2: 98%  Weight: 130 lb 6.4 oz (59.1 kg)  Height: _0  (1.651 m)   Body mass index is 21.7 kg/m. Physical Exam Vitals and nursing note reviewed.  Constitutional:      General: She is not in acute distress.    Appearance: She is normal weight. She is not ill-appearing.  Cardiovascular:     Rate and Rhythm: Normal rate and regular rhythm.     Pulses: Normal pulses.     Heart sounds: Normal heart sounds. No murmur heard.   Pulmonary:     Effort: Pulmonary effort is normal. No respiratory distress.     Breath sounds: Normal breath sounds. No wheezing.  Neurological:     Mental Status: She is alert.  Psychiatric:        Attention and Perception: Attention normal.        Mood and Affect: Affect is flat and angry.        Speech: Speech normal.        Behavior: Behavior is cooperative.        Thought Content: Thought content is delusional.        Cognition and Memory: Memory is impaired.     Labs reviewed: Recent Labs    07/08/20 0915 07/09/20 0410 07/10/20 0350 07/10/20 0350 07/11/20 0408 07/12/20 0430 07/13/20 0334  NA 140   < > 142   < > 144 144 144  K 3.2*   < > 3.2*   < > 3.6 3.8 3.6  CL 109   < > 110   < > 111 111 111  CO2 28   < > 24   < > _1 GLUCOSE 80   < > 129*   < > 79 84 82  BUN 10   < >  10   < > 6* 6* 6*  CREATININE 0.50   < > 0.66   < > 0.50 0.60 0.65  CALCIUM 7.9*   < > 7.8*   < > 8.1* 8.2* 8.1*  MG 1.7  --  2.0  --   --  2.1   --   PHOS 2.4*  --   --   --   --   --   --    < > = values in this interval not displayed.   Recent Labs    07/11/20 0408 07/12/20 0430 07/13/20 0334  AST 10* 14* 16  ALT _0 ALKPHOS 56 64 63  BILITOT 0.5 0.4 0.5  PROT 4.1* 4.6* 4.3*  ALBUMIN 1.6* 1.7* 1.6*   Recent Labs    07/07/20 0102 07/07/20 0102 07/07/20 0605 07/07/20 0605 07/08/20 0915 07/09/20 0410 07/11/20 0408 07/12/20 0430 07/13/20 0334  WBC 14.6*   < > 18.1*   < > 12.3*   < > 7.2 8.3 6.7  NEUTROABS 11.6*  --  14.3*  --  9.8*  --   --   --   --   HGB 7.8*   < > 8.9*   < > 8.1*   < > 7.6* 8.1* 7.8*  HCT 26.4*   < > 29.5*   < > 27.5*   < > 25.9* 27.2* 26.3*  MCV 85.7   < > 85.3   < > 84.9   < > 86.9 85.5 87.1  PLT 465*   < > 630*   < > 517*   < > 402* 418* 384   < > = values in this interval not displayed.   No results found for: TSH No results found for: HGBA1C Lab Results  Component Value Date   CHOL 163 05/23/2020   HDL 51 05/23/2020   LDLCALC 50 05/23/2020   TRIG 309 (A) 05/23/2020    Significant Diagnostic Results in last 30 days:  CT ABDOMEN PELVIS W CONTRAST  Result Date: 07/08/2020 CLINICAL DATA:  Abdominal abscess/infection suspected. Sacral wound infection. Back pain. EXAM: CT ABDOMEN AND PELVIS WITH CONTRAST TECHNIQUE: Multidetector CT imaging of the abdomen and pelvis was performed using the standard protocol following bolus administration of intravenous contrast. CONTRAST:  163m OMNIPAQUE IOHEXOL 300 MG/ML  SOLN COMPARISON:  CT dated 10/03/2019 FINDINGS: Lower chest: There are trace bilateral pleural effusions, left greater than right. There is bibasilar atelectasis, left worse than right.The heart size is normal. Hepatobiliary: The liver is normal. Normal gallbladder.There is no biliary ductal dilation. Pancreas: Normal contours without ductal dilatation. No peripancreatic fluid collection. Spleen: Unremarkable. Adrenals/Urinary Tract: --Adrenal glands: Unremarkable. --Right kidney/ureter:  There is severe right-sided hydroureteronephrosis to the level of the urinary bladder. --Left kidney/ureter: There is severe left-sided hydronephrosis to the level of the urinary bladder. There are areas of cortical thinning and scarring. The left-sided collecting system is partially duplicated. --Urinary bladder: The urinary bladder is trabeculated with multiple bladder diverticula. There is diffuse bladder wall thickening with adjacent fat stranding. Stomach/Bowel: --Stomach/Duodenum: No hiatal hernia or other gastric abnormality. Normal duodenal course and caliber. --Small bowel: Unremarkable. --Colon: There is a large amount of stool throughout the colon. --Appendix: Normal. Vascular/Lymphatic: Atherosclerotic calcification is present within the non-aneurysmal abdominal aorta, without hemodynamically significant stenosis. There is a questionable filling defect within the right common femoral vein (axial series 2, image 75). --No retroperitoneal lymphadenopathy. --No mesenteric lymphadenopathy. --No pelvic or inguinal lymphadenopathy. Reproductive: Unremarkable Other: There is mild body  wall edema. There are few pockets of subcutaneous gas on the patient's low anterior left abdomen favored to represent sequela of a prior subcutaneous injection. Musculoskeletal. 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 There is presacral edema which is likely reactive. These findings are new since the patient's CT dated 10/03/2019. IMPRESSION: 1. 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 These findings are new since the patient's CT dated 10/03/2019. 2. There is a questionable filling defect within the right common femoral vein, concerning for DVT. Recommend further evaluation with a dedicated right lower extremity venous Doppler ultrasound. 3. Severe bilateral hydroureteronephrosis  to the level of the urinary bladder. This is likely secondary to chronic bladder outlet obstruction. 4. Diffuse bladder wall thickening with adjacent fat stranding is concerning for cystitis. Correlation with urinalysis is recommended. 5. Trace bilateral pleural effusions, left greater than right, with bibasilar atelectasis, left worse than right. 6. Large amount of stool throughout the colon. Aortic Atherosclerosis (ICD10-I70.0). Electronically Signed   By: Constance Holster M.D.   On: 07/08/2020 22:47   DG Chest Port 1 View  Result Date: 07/07/2020 CLINICAL DATA:  Possible overdose. EXAM: PORTABLE CHEST 1 VIEW COMPARISON:  January 19, 2020 FINDINGS: A right-sided PICC line is seen with its distal tip noted at the junction of the superior vena cava and right atrium. There is no evidence of acute infiltrate. A small left pleural effusion is seen. No pneumothorax is identified. The heart size and mediastinal contours are within normal limits. The visualized skeletal structures are unremarkable. IMPRESSION: 1. Right-sided PICC line positioning, as described above. 2. Small left pleural effusion. Electronically Signed   By: Virgina Norfolk M.D.   On: 07/07/2020 02:40   VAS [place] LOWER EXTREMITY VENOUS (DVT)  Result Date: 07/09/2020  Lower Venous DVT Study Indications: Swelling.  Risk Factors: Immobility. Limitations: Poor ultrasound/tissue interface and patient positioning, patient involuntary movement. Comparison Study: No prior studies. Performing Technologist: Oliver Hum RVT  Examination Guidelines: A complete evaluation includes B-mode imaging, spectral Doppler, color Doppler, and power Doppler as needed of all accessible portions of each vessel. Bilateral testing is considered an integral part of a complete examination. Limited examinations for reoccurring indications may be performed as noted. The reflux portion of the exam is performed with the patient in reverse Trendelenburg.   +---------+---------------+---------+-----------+----------+--------------+ RIGHT    CompressibilityPhasicitySpontaneityPropertiesThrombus Aging +---------+---------------+---------+-----------+----------+--------------+ CFV      Full           Yes      Yes                                 +---------+---------------+---------+-----------+----------+--------------+ SFJ      Full                                                        +---------+---------------+---------+-----------+----------+--------------+ FV Prox  Full                                                        +---------+---------------+---------+-----------+----------+--------------+  FV Mid   Full                                                        +---------+---------------+---------+-----------+----------+--------------+ FV DistalFull                                                        +---------+---------------+---------+-----------+----------+--------------+ PFV      Full                                                        +---------+---------------+---------+-----------+----------+--------------+ POP      Full           Yes      Yes                                 +---------+---------------+---------+-----------+----------+--------------+ PTV      Full                                                        +---------+---------------+---------+-----------+----------+--------------+ PERO     Full                                                        +---------+---------------+---------+-----------+----------+--------------+   +---------+---------------+---------+-----------+----------+--------------+ LEFT     CompressibilityPhasicitySpontaneityPropertiesThrombus Aging +---------+---------------+---------+-----------+----------+--------------+ CFV      Full           Yes      Yes                                  +---------+---------------+---------+-----------+----------+--------------+ SFJ      Full                                                        +---------+---------------+---------+-----------+----------+--------------+ FV Prox  Full                                                        +---------+---------------+---------+-----------+----------+--------------+ FV Mid   Full                                                        +---------+---------------+---------+-----------+----------+--------------+  FV DistalFull                                                        +---------+---------------+---------+-----------+----------+--------------+ PFV      Full                                                        +---------+---------------+---------+-----------+----------+--------------+ POP      Full           Yes      Yes                                 +---------+---------------+---------+-----------+----------+--------------+ PTV      Full                                                        +---------+---------------+---------+-----------+----------+--------------+ PERO     Full                                                        +---------+---------------+---------+-----------+----------+--------------+     Summary: RIGHT: - There is no evidence of deep vein thrombosis in the lower extremity. However, portions of this examination were limited- see technologist comments above.  - No cystic structure found in the popliteal fossa.  LEFT: - There is no evidence of deep vein thrombosis in the lower extremity. However, portions of this examination were limited- see technologist comments above.  - No cystic structure found in the popliteal fossa.  *See table(s) above for measurements and observations. Electronically signed by Monica Martinez MD on 07/09/2020 at 4:41:06 PM.    Final     Assessment/Plan 1. Acute encephalopathy - ongoing, she appears  more agitated this morning and fixated on being overdosed with hydrocodone.  - ammonia, B12, and HIV tests normal - norco and tizanidine discontinued during hospitalization - she had not received tramadol since 07/14/20, has not received lorazepam since 07/10/20 - refused UA yesterday, will have nursing staff try again today - klebsiella found in urine culture, treatment not recommended due to C.Difficile and resistance  2. Delirium - same as above - recommend opening window blinds during the day and letting sunlight in room - I have advised staff to monitor hours of sleep each night for 3 days   Family/ staff Communication: Plan discussed with patient and facility nurse  Labs/tests ordered:  none

## 2020-07-21 LAB — BASIC METABOLIC PANEL
BUN: 9 (ref 4–21)
CO2: 25 — AB (ref 13–22)
Chloride: 114 — AB (ref 99–108)
Creatinine: 0.5 (ref 0.5–1.1)
Glucose: 71
Potassium: 3.3 — AB (ref 3.4–5.3)
Sodium: 148 — AB (ref 137–147)

## 2020-07-21 LAB — COMPREHENSIVE METABOLIC PANEL
Calcium: 7.7 — AB (ref 8.7–10.7)
GFR calc Af Amer: >90
GFR calc non Af Amer: >90

## 2020-07-23 LAB — BASIC METABOLIC PANEL
BUN: 9 (ref 4–21)
CO2: 22 (ref 13–22)
Chloride: 108 (ref 99–108)
Creatinine: 0.6 (ref 0.5–1.1)
Glucose: 123
Potassium: 3.4 (ref 3.4–5.3)
Sodium: 145 (ref 137–147)

## 2020-07-23 LAB — CBC AND DIFFERENTIAL
HCT: 34 — AB (ref 36–46)
Hemoglobin: 10.5 — AB (ref 12.0–16.0)
Platelets: 507 — AB (ref 150–399)
WBC: 9.5

## 2020-07-23 LAB — CBC: RBC: 4.04 (ref 3.87–5.11)

## 2020-07-23 LAB — COMPREHENSIVE METABOLIC PANEL
Calcium: 8.3 — AB (ref 8.7–10.7)
GFR calc Af Amer: >90
GFR calc non Af Amer: >90

## 2020-07-25 ENCOUNTER — Non-Acute Institutional Stay (SKILLED_NURSING_FACILITY): Payer: Medicare Other | Admitting: Orthopedic Surgery

## 2020-07-25 ENCOUNTER — Encounter: Payer: Self-pay | Admitting: Orthopedic Surgery

## 2020-07-25 DIAGNOSIS — R400 Somnolence: Secondary | ICD-10-CM

## 2020-07-25 LAB — MAGNESIUM: Magnesium: 1.9

## 2020-07-25 NOTE — Progress Notes (Signed)
Location:    [place] Room Number: 210/W Place of Service:  SNF (31) Provider:  Windell Moulding NP  [name], DO  Patient Care Team: [name], DO as PCP - General (Geriatric Medicine) [facility], [place] [facility]) [place], Nelda Bucks, NP as Nurse Practitioner (Family Medicine) Carlyle Basques, MD as Consulting Physician (Infectious Diseases) Ala Bent, MD as Referring Physician (Neurology)  Extended Emergency Contact Information Primary Emergency Contact: [name] Home Phone: [phone] Mobile Phone: [phone] Relation: Sister Secondary Emergency Contact: [name] Home Phone: [phone] Mobile Phone: [phone] Relation: Niece  Code Status: Full Code  Goals of care: Advanced Directive information Advanced Directives 07/25/2020  Does Patient Have a Medical Advance Directive? Yes  Type of Advance Directive Out of facility DNR (pink MOST or yellow form)  Does patient want to make changes to medical advance directive? No - Patient declined  Copy of [place] in Chart? -  Pre-existing out of facility DNR order (yellow form or pink MOST form) Yellow form placed in chart (order not valid for inpatient use)     Chief Complaint  Patient presents with  . Acute Visit    Lethargic    HPI:  Pt is a 64 y.o. female seen today for an acute visit for increased lethargy.   She is a resident of [facility], seen at bedside today. Past medical history includes: progressive multiple sclerosis, recurrent colitis, irritable bowel syndrome, osteomyelitis of coccyx, recurrent urinary tract infections, and neurogenic bladder with indwelling catheter.   She was recently hospitalized 11/20 for urinary tract infection, delirium and osteomyelitis of sacrum. Today, facility nurse states she is lethargic and difficult to awaken. Facility nurse (Olu) present during  encounter. She awakens to voice and pain stimuli. She can state her name and where she is. She can express her needs and follow commands. I asked her to open her eyes a few times and she complied. She also asked for water because her mouth was dry. Vital signs stable.    Past Medical History:  Diagnosis Date  . Actinic keratosis   . BCC (basal cell carcinoma of skin)   . Closed fracture of second metatarsal bone of right foot   . Closed fracture of third metatarsal bone of right foot with routine healing   . IBS (irritable bowel syndrome)   . Multiple sclerosis ([place])   . Neurogenic bladder   . Rectal polyp    Tubular adenoma  . Recurrent UTI   . SCC (squamous cell carcinoma)    Past Surgical History:  Procedure Laterality Date  . BOTOX INJECTION    . BREAST BIOPSY    . COLONOSCOPY    . SKIN CANCER EXCISION      No Known Allergies  Allergies as of 07/25/2020   No Known Allergies     Medication List       Accurate as of July 25, 2020  2:57 PM. If you have any questions, ask your nurse or doctor.        STOP taking these medications   enoxaparin 40 MG/0.4ML injection Commonly known as: LOVENOX Stopped by: Yvonna Alanis, NP     TAKE these medications   acetaminophen 500 MG tablet Commonly known as: TYLENOL Take 2 tablets (1,000 mg total) by mouth 3 (three) times daily.   ampicillin-sulbactam  IVPB Commonly known as: UNASYN Inject 3 g into the vein every 6 (six)  hours for 26 days. Indication:  Osteomyelitis of coccyx First Dose: Yes Last Day of Therapy:  08/05/2020 Labs - Once weekly:  CBC/D and BMP, Labs - Every other week:  ESR and CRP Method of administration: Mini-Bag Plus / Gravity Method of administration may be changed at the discretion of home infusion pharmacist based upon assessment of the patient and/or caregiver's ability to self-administer the medication ordered.   baclofen 20 MG tablet Commonly known as: LIORESAL Take 20 mg by mouth 3 (three)  times daily.   baclofen 10 MG tablet Commonly known as: LIORESAL Take 30 mg by mouth at bedtime.   BIOFREEZE EX Apply 5 % topically in the morning, at noon, in the evening, and at bedtime. Apply to Left Shoulder.   cetirizine 5 MG tablet Commonly known as: ZYRTEC Take 1 tablet (5 mg total) by mouth at bedtime.   Culturelle Caps Take by mouth. 10B Cell Capsule   dicyclomine 20 MG tablet Commonly known as: BENTYL Take 20 mg by mouth in the morning, at noon, in the evening, and at bedtime.   feeding supplement (PRO-STAT SUGAR FREE 64) Liqd Take 30 mLs by mouth in the morning and at bedtime.   ferrous sulfate 325 (65 FE) MG tablet Take 1 tablet (325 mg total) by mouth daily with breakfast.   FLUoxetine 40 MG capsule Commonly known as: PROZAC Take 1 capsule (40 mg total) by mouth daily.   folic acid 1 MG tablet Commonly known as: FOLVITE Take 1 tablet (1 mg total) by mouth daily.   hydrocortisone cream 1 % Apply 1 application topically every 12 (twelve) hours as needed for itching.   JUVEN PO Take 1 each by mouth in the morning and at bedtime.   LORazepam 0.5 MG tablet Commonly known as: ATIVAN Take 1 tablet (0.5 mg total) by mouth daily as needed (Panic attack).   mirtazapine 7.5 MG tablet Commonly known as: REMERON Take 7.5 mg by mouth at bedtime.   NON FORMULARY Magic Cup with L/D meals d/t weight loss.   ondansetron 4 MG tablet Commonly known as: ZOFRAN Take 4 mg by mouth in the morning, at noon, and at bedtime. Nausea and Vomiting   polyethylene glycol 17 g packet Commonly known as: MIRALAX / GLYCOLAX Take 17 g by mouth daily as needed for mild constipation.   potassium chloride 10 MEQ tablet Commonly known as: KLOR-CON Take 10 mEq by mouth daily.   pravastatin 40 MG tablet Commonly known as: PRAVACHOL Take 40 mg by mouth daily.   senna 8.6 MG Tabs tablet Commonly known as: SENOKOT Take 2 tablets (17.2 mg total) by mouth at bedtime.   tamsulosin  0.4 MG Caps capsule Commonly known as: FLOMAX Take 0.4 mg by mouth daily.   traMADol 50 MG tablet Commonly known as: ULTRAM Take 0.5 tablets (25 mg total) by mouth every 8 (eight) hours as needed for severe pain.   Vancomycin HCl 50 MG/ML Solr Take 2.5 mLs by mouth every 12 (twelve) hours. For 26 days   vitamin B-12 1000 MCG tablet Commonly known as: CYANOCOBALAMIN Take 1,000 mcg by mouth daily.   VITAMIN C PO Take 500 mg by mouth 2 (two) times daily.   Vitamin D (Cholecalciferol) 25 MCG (1000 UT) Tabs Take 5,000 Units by mouth daily.       Review of Systems  Constitutional: Positive for fatigue. Negative for activity change and appetite change.  Respiratory: Negative for cough and shortness of breath.   Cardiovascular: Negative for chest  pain and leg swelling.  Gastrointestinal: Negative for abdominal pain, blood in stool and constipation.  Genitourinary:       Indwelling catheter  Skin:       Osteomyelitis of sacrum  Psychiatric/Behavioral: Positive for dysphoric mood. Negative for sleep disturbance. The patient is not nervous/anxious.        History of delirium    Immunization History  Administered Date(s) Administered  . Influenza, High Dose Seasonal PF 07/01/2018  . Influenza,inj,Quad PF,6+ Mos 05/20/2013, 05/26/2014, 06/18/2015, 06/18/2016, 06/29/2017  . Influenza-Unspecified 06/20/2019, 06/05/2020, 06/05/2020  . Moderna SARS-COVID-2 Vaccination 08/19/2019, 10/08/2019  . PPD Test 08/23/2019  . Td 08/18/1998  . Tdap 03/11/2012   Pertinent  Health Maintenance Due  Topic Date Due  . PAP SMEAR-Modifier  Never done  . MAMMOGRAM  Never done  . COLONOSCOPY  Never done  . INFLUENZA VACCINE  Completed   Fall Risk  05/31/2020 04/19/2020  Falls in the past year? 0 0  Risk for fall due to : No Fall Risks No Fall Risks  Follow up Falls evaluation completed Falls evaluation completed   Functional Status Survey:    Vitals:   07/25/20 1438  BP: (!) 121/58  Pulse:  79  Resp: 15  Temp: 97.9 F (36.6 C)  SpO2: 98%  Weight: 129 lb 3.2 oz (58.6 kg)  Height: $Remove'5\' 5"'pMWqhxH$  (1.651 m)   Body mass index is 21.5 kg/m. Physical Exam Vitals reviewed.  Constitutional:      General: She is not in acute distress.    Appearance: Normal appearance. She is not ill-appearing.  Eyes:     Extraocular Movements: Extraocular movements intact.     Pupils: Pupils are equal, round, and reactive to light.     Comments: Pupils  55mm, reactive  Cardiovascular:     Rate and Rhythm: Normal rate and regular rhythm.     Pulses: Normal pulses.     Heart sounds: Normal heart sounds. No murmur heard.   Pulmonary:     Effort: Pulmonary effort is normal. No respiratory distress.     Breath sounds: Normal breath sounds. No wheezing.  Abdominal:     General: Abdomen is flat. Bowel sounds are normal.     Palpations: Abdomen is soft.  Musculoskeletal:     Right lower leg: No edema.     Left lower leg: No edema.  Skin:    Capillary Refill: Capillary refill takes less than 2 seconds.  Neurological:     General: No focal deficit present.     Mental Status: She is easily aroused.     Motor: Weakness present.     Gait: Gait abnormal.  Psychiatric:        Mood and Affect: Affect is flat.        Behavior: Behavior is cooperative.     Labs reviewed: Recent Labs    07/08/20 0915 07/09/20 0410 07/10/20 0350 07/10/20 0350 07/11/20 0408 07/11/20 0408 07/12/20 0430 07/12/20 0430 07/13/20 0334 07/16/20 0000 07/17/20 0000  NA 140   < > 142   < > 144   < > 144   < > 144 147 145  K 3.2*   < > 3.2*   < > 3.6   < > 3.8   < > 3.6 3.8 4.2  CL 109   < > 110   < > 111   < > 111   < > 111 114* 113*  CO2 28   < > 24   < > 27   < >  26   < > 26 26* 23*  GLUCOSE 80   < > 129*   < > 79  --  84  --  82  --   --   BUN 10   < > 10   < > 6*   < > 6*   < > 6* 22* 20  CREATININE 0.50   < > 0.66   < > 0.50   < > 0.60   < > 0.65 0.5 0.6  CALCIUM 7.9*   < > 7.8*   < > 8.1*   < > 8.2*   < > 8.1*  8.1* 8.0*  MG 1.7  --  2.0  --   --   --  2.1  --   --   --   --   PHOS 2.4*  --   --   --   --   --   --   --   --   --   --    < > = values in this interval not displayed.   Recent Labs    07/11/20 0408 07/12/20 0430 07/13/20 0334  AST 10* 14* 16  ALT $Re'7 9 9  'uFA$ ALKPHOS 56 64 63  BILITOT 0.5 0.4 0.5  PROT 4.1* 4.6* 4.3*  ALBUMIN 1.6* 1.7* 1.6*   Recent Labs    07/07/20 0102 07/07/20 0102 07/07/20 3435 07/07/20 0605 07/08/20 0915 07/09/20 0410 07/11/20 0408 07/11/20 0408 07/12/20 0430 07/12/20 0430 07/13/20 0334 07/16/20 0000 07/17/20 0000  WBC 14.6*   < > 18.1*   < > 12.3*   < > 7.2   < > 8.3   < > 6.7 7.9 10.6  NEUTROABS 11.6*  --  14.3*  --  9.8*  --   --   --   --   --   --   --   --   HGB 7.8*   < > 8.9*   < > 8.1*   < > 7.6*   < > 8.1*   < > 7.8* 8.0* 10.3*  HCT 26.4*   < > 29.5*   < > 27.5*   < > 25.9*   < > 27.2*   < > 26.3* 25* 33*  MCV 85.7   < > 85.3   < > 84.9   < > 86.9  --  85.5  --  87.1  --   --   PLT 465*   < > 630*   < > 517*   < > 402*   < > 418*   < > 384 355 379   < > = values in this interval not displayed.   No results found for: TSH No results found for: HGBA1C Lab Results  Component Value Date   CHOL 163 05/23/2020   HDL 51 05/23/2020   LDLCALC 50 05/23/2020   TRIG 309 (A) 05/23/2020    Significant Diagnostic Results in last 30 days:  CT ABDOMEN PELVIS W CONTRAST  Result Date: 07/08/2020 CLINICAL DATA:  Abdominal abscess/infection suspected. Sacral wound infection. Back pain. EXAM: CT ABDOMEN AND PELVIS WITH CONTRAST TECHNIQUE: Multidetector CT imaging of the abdomen and pelvis was performed using the standard protocol following bolus administration of intravenous contrast. CONTRAST:  154mL OMNIPAQUE IOHEXOL 300 MG/ML  SOLN COMPARISON:  CT dated 10/03/2019 FINDINGS: Lower chest: There are trace bilateral pleural effusions, left greater than right. There is bibasilar atelectasis, left worse than right.The heart size is normal. Hepatobiliary:  The liver is  normal. Normal gallbladder.There is no biliary ductal dilation. Pancreas: Normal contours without ductal dilatation. No peripancreatic fluid collection. Spleen: Unremarkable. Adrenals/Urinary Tract: --Adrenal glands: Unremarkable. --Right kidney/ureter: There is severe right-sided hydroureteronephrosis to the level of the urinary bladder. --Left kidney/ureter: There is severe left-sided hydronephrosis to the level of the urinary bladder. There are areas of cortical thinning and scarring. The left-sided collecting system is partially duplicated. --Urinary bladder: The urinary bladder is trabeculated with multiple bladder diverticula. There is diffuse bladder wall thickening with adjacent fat stranding. Stomach/Bowel: --Stomach/Duodenum: No hiatal hernia or other gastric abnormality. Normal duodenal course and caliber. --Small bowel: Unremarkable. --Colon: There is a large amount of stool throughout the colon. --Appendix: Normal. Vascular/Lymphatic: Atherosclerotic calcification is present within the non-aneurysmal abdominal aorta, without hemodynamically significant stenosis. There is a questionable filling defect within the right common femoral vein (axial series 2, image 75). --No retroperitoneal lymphadenopathy. --No mesenteric lymphadenopathy. --No pelvic or inguinal lymphadenopathy. Reproductive: Unremarkable Other: There is mild body wall edema. There are few pockets of subcutaneous gas on the patient's low anterior left abdomen favored to represent sequela of a prior subcutaneous injection. Musculoskeletal. 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 There is presacral edema which is likely reactive. These findings are new since the patient's CT dated 10/03/2019. IMPRESSION: 1. 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 These findings are new since the  patient's CT dated 10/03/2019. 2. There is a questionable filling defect within the right common femoral vein, concerning for DVT. Recommend further evaluation with a dedicated right lower extremity venous Doppler ultrasound. 3. Severe bilateral hydroureteronephrosis to the level of the urinary bladder. This is likely secondary to chronic bladder outlet obstruction. 4. Diffuse bladder wall thickening with adjacent fat stranding is concerning for cystitis. Correlation with urinalysis is recommended. 5. Trace bilateral pleural effusions, left greater than right, with bibasilar atelectasis, left worse than right. 6. Large amount of stool throughout the colon. Aortic Atherosclerosis (ICD10-I70.0). Electronically Signed   By: Constance Holster M.D.   On: 07/08/2020 22:47   DG Chest Port 1 View  Result Date: 07/07/2020 CLINICAL DATA:  Possible overdose. EXAM: PORTABLE CHEST 1 VIEW COMPARISON:  January 19, 2020 FINDINGS: A right-sided PICC line is seen with its distal tip noted at the junction of the superior vena cava and right atrium. There is no evidence of acute infiltrate. A small left pleural effusion is seen. No pneumothorax is identified. The heart size and mediastinal contours are within normal limits. The visualized skeletal structures are unremarkable. IMPRESSION: 1. Right-sided PICC line positioning, as described above. 2. Small left pleural effusion. Electronically Signed   By: Virgina Norfolk M.D.   On: 07/07/2020 02:40   VAS [place] LOWER EXTREMITY VENOUS (DVT)  Result Date: 07/09/2020  Lower Venous DVT Study Indications: Swelling.  Risk Factors: Immobility. Limitations: Poor ultrasound/tissue interface and patient positioning, patient involuntary movement. Comparison Study: No prior studies. Performing Technologist: Oliver Hum RVT  Examination Guidelines: A complete evaluation includes B-mode imaging, spectral Doppler, color Doppler, and power Doppler as needed of all accessible portions of each  vessel. Bilateral testing is considered an integral part of a complete examination. Limited examinations for reoccurring indications may be performed as noted. The reflux portion of the exam is performed with the patient in reverse Trendelenburg.  +---------+---------------+---------+-----------+----------+--------------+ RIGHT    CompressibilityPhasicitySpontaneityPropertiesThrombus Aging +---------+---------------+---------+-----------+----------+--------------+ CFV      Full  Yes      Yes                                 +---------+---------------+---------+-----------+----------+--------------+ SFJ      Full                                                        +---------+---------------+---------+-----------+----------+--------------+ FV Prox  Full                                                        +---------+---------------+---------+-----------+----------+--------------+ FV Mid   Full                                                        +---------+---------------+---------+-----------+----------+--------------+ FV DistalFull                                                        +---------+---------------+---------+-----------+----------+--------------+ PFV      Full                                                        +---------+---------------+---------+-----------+----------+--------------+ POP      Full           Yes      Yes                                 +---------+---------------+---------+-----------+----------+--------------+ PTV      Full                                                        +---------+---------------+---------+-----------+----------+--------------+ PERO     Full                                                        +---------+---------------+---------+-----------+----------+--------------+   +---------+---------------+---------+-----------+----------+--------------+ LEFT      CompressibilityPhasicitySpontaneityPropertiesThrombus Aging +---------+---------------+---------+-----------+----------+--------------+ CFV      Full           Yes      Yes                                 +---------+---------------+---------+-----------+----------+--------------+ SFJ  Full                                                        +---------+---------------+---------+-----------+----------+--------------+ FV Prox  Full                                                        +---------+---------------+---------+-----------+----------+--------------+ FV Mid   Full                                                        +---------+---------------+---------+-----------+----------+--------------+ FV DistalFull                                                        +---------+---------------+---------+-----------+----------+--------------+ PFV      Full                                                        +---------+---------------+---------+-----------+----------+--------------+ POP      Full           Yes      Yes                                 +---------+---------------+---------+-----------+----------+--------------+ PTV      Full                                                        +---------+---------------+---------+-----------+----------+--------------+ PERO     Full                                                        +---------+---------------+---------+-----------+----------+--------------+     Summary: RIGHT: - There is no evidence of deep vein thrombosis in the lower extremity. However, portions of this examination were limited- see technologist comments above.  - No cystic structure found in the popliteal fossa.  LEFT: - There is no evidence of deep vein thrombosis in the lower extremity. However, portions of this examination were limited- see technologist comments above.  - No cystic structure found in the popliteal  fossa.  *See table(s) above for measurements and observations. Electronically signed by Monica Martinez MD on 07/09/2020 at 4:41:06 PM.    Final     Assessment/Plan 1. Drowsiness - at this time she is aroused to voice and pain stimuli,  express needs and follow commands - pupils rective to light, 69mm, vitals stable - past hospitalization includes diagnosis of acute encephalopathy possibly due to narcotics - will discontinue tramadol - will discontinue tylenol 1000 mg TID - will discontinue lorazepam - Will decrease Baclofen to 15 mg QHS - Start tylenol 650 mg PO Q 6 hrs PRN for pain - continue weekly bmp draws     Family/ staff Communication: Plan discussed with patient and facility nurse  Labs/tests ordered:  none

## 2020-07-27 ENCOUNTER — Non-Acute Institutional Stay (SKILLED_NURSING_FACILITY): Payer: Medicare Other | Admitting: Internal Medicine

## 2020-07-27 ENCOUNTER — Encounter: Payer: Self-pay | Admitting: Internal Medicine

## 2020-07-27 DIAGNOSIS — Z7189 Other specified counseling: Secondary | ICD-10-CM | POA: Diagnosis not present

## 2020-07-27 DIAGNOSIS — A0471 Enterocolitis due to Clostridium difficile, recurrent: Secondary | ICD-10-CM

## 2020-07-27 DIAGNOSIS — M8618 Other acute osteomyelitis, other site: Secondary | ICD-10-CM | POA: Diagnosis not present

## 2020-07-27 DIAGNOSIS — M4628 Osteomyelitis of vertebra, sacral and sacrococcygeal region: Secondary | ICD-10-CM

## 2020-07-27 DIAGNOSIS — L89154 Pressure ulcer of sacral region, stage 4: Secondary | ICD-10-CM | POA: Diagnosis not present

## 2020-07-27 DIAGNOSIS — T148XXA Other injury of unspecified body region, initial encounter: Secondary | ICD-10-CM

## 2020-07-27 DIAGNOSIS — F410 Panic disorder [episodic paroxysmal anxiety] without agoraphobia: Secondary | ICD-10-CM

## 2020-07-27 DIAGNOSIS — R52 Pain, unspecified: Secondary | ICD-10-CM

## 2020-07-27 DIAGNOSIS — G35 Multiple sclerosis: Secondary | ICD-10-CM

## 2020-07-27 DIAGNOSIS — R410 Disorientation, unspecified: Secondary | ICD-10-CM

## 2020-07-27 NOTE — Progress Notes (Signed)
Location:  [facility] Room Number: 210 Place of Service:  SNF (31) Provider:  Diya Gervasi L. Mariea Clonts, D.O., C.M.D.  [name], DO  Patient Care Team: [name], DO as PCP - General (Geriatric Medicine) [facility] [place], Nelda Bucks, NP as Nurse Practitioner (Family Medicine) Carlyle Basques, MD as Consulting Physician (Infectious Diseases) Ala Bent, MD as Referring Physician (Neurology)  Extended Emergency Contact Information Primary Emergency Contact: [name] Home Phone: [phone] Mobile Phone: [phone] Relation: Sister Secondary Emergency Contact: [name] Home Phone: [phone] Mobile Phone: [phone] Relation: Niece  Code Status:  FULL CODE--reviewed today and wish remains to receive CPR if she has a cardiopulmonary arrest  Goals of care: Advanced Directive information Advanced Directives 08/01/2020  Does Patient Have a Medical Advance Directive? Yes  Type of Advance Directive Out of facility DNR (pink MOST or yellow form)  Does patient want to make changes to medical advance directive? No - Patient declined  Copy of [place] in Chart? -  Pre-existing out of facility DNR order (yellow form or pink MOST form) Pink MOST form placed in chart (order not valid for inpatient use)     Chief Complaint  Patient presents with  . Acute Visit    Care planning meeting with family     HPI:  Renee Huang is a 64 y.o. female seen today for an acute visit for care plan meeting with resident in room and her niece by phone as well as staff members of [employer]. See a/p below.   Past Medical History:  Diagnosis Date  . Actinic keratosis   . BCC (basal cell carcinoma of skin)   . Closed fracture of second metatarsal bone of right foot   . Closed fracture of third metatarsal bone of right foot with routine healing   . IBS (irritable bowel syndrome)   . Multiple  sclerosis ([place])   . Neurogenic bladder   . Rectal polyp    Tubular adenoma  . Recurrent UTI   . SCC (squamous cell carcinoma)    Past Surgical History:  Procedure Laterality Date  . BOTOX INJECTION    . BREAST BIOPSY    . COLONOSCOPY    . SKIN CANCER EXCISION      No Known Allergies  Outpatient Encounter Medications as of 07/27/2020  Medication Sig  . Amino Acids-Protein Hydrolys (FEEDING SUPPLEMENT, PRO-STAT SUGAR FREE 64,) LIQD Take 30 mLs by mouth in the morning and at bedtime.  . [EXPIRED] ampicillin-sulbactam (UNASYN) IVPB Inject 3 g into the vein every 6 (six) hours for 26 days. Indication:  Osteomyelitis of coccyx First Dose: Yes Last Day of Therapy:  08/05/2020 Labs - Once weekly:  CBC/D and BMP, Labs - Every other week:  ESR and CRP Method of administration: Mini-Bag Plus / Gravity Method of administration may be changed at the discretion of home infusion pharmacist based upon assessment of the patient and/or caregiver's ability to self-administer the medication ordered.  . Ascorbic Acid (VITAMIN C PO) Take 500 mg by mouth 2 (two) times daily.  . baclofen (LIORESAL) 10 MG tablet Take 15 mg by mouth at bedtime.  . baclofen (LIORESAL) 20 MG tablet Take 20 mg by mouth 3 (three) times daily.  . cetirizine (ZYRTEC) 5 MG tablet Take 1 tablet (5 mg total) by mouth at bedtime.  . dicyclomine (BENTYL) 20 MG tablet Take 20 mg by mouth in the morning, at noon, in the  evening, and at bedtime.   . ferrous sulfate 325 (65 FE) MG tablet Take 1 tablet (325 mg total) by mouth daily with breakfast.  . FLUoxetine (PROZAC) 40 MG capsule Take 1 capsule (40 mg total) by mouth daily.  . folic acid (FOLVITE) 1 MG tablet Take 1 tablet (1 mg total) by mouth daily.  . hydrocortisone cream 1 % Apply 1 application topically every 12 (twelve) hours as needed for itching.   . Lactobacillus Rhamnosus, GG, (CULTURELLE) CAPS Take by mouth. 10B Cell Capsule  . Menthol, Topical Analgesic, (BIOFREEZE EX)  Apply 5 % topically in the morning, at noon, in the evening, and at bedtime. Apply to Left Shoulder.  . mirtazapine (REMERON) 7.5 MG tablet Take 7.5 mg by mouth at bedtime.  . NON FORMULARY Magic Cup with L/D meals d/t weight loss.  . Nutritional Supplements (JUVEN PO) Take 1 each by mouth in the morning and at bedtime.  . ondansetron (ZOFRAN) 4 MG tablet Take 4 mg by mouth in the morning, at noon, and at bedtime. Nausea and Vomiting  . polyethylene glycol (MIRALAX / GLYCOLAX) 17 g packet Take 17 g by mouth daily as needed for mild constipation.  . potassium chloride (KLOR-CON) 10 MEQ tablet Take 20 mEq by mouth daily.  . pravastatin (PRAVACHOL) 40 MG tablet Take 40 mg by mouth daily.  Marland Kitchen senna (SENOKOT) 8.6 MG TABS tablet Take 2 tablets (17.2 mg total) by mouth at bedtime.  . tamsulosin (FLOMAX) 0.4 MG CAPS capsule Take 0.4 mg by mouth daily.  . Vancomycin HCl 50 MG/ML SOLR Take 2.5 mLs by mouth every 12 (twelve) hours. For 26 days  . vitamin B-12 (CYANOCOBALAMIN) 1000 MCG tablet Take 1,000 mcg by mouth daily.  . [DISCONTINUED] acetaminophen (TYLENOL) 500 MG tablet Take 2 tablets (1,000 mg total) by mouth 3 (three) times daily.  . [DISCONTINUED] LORazepam (ATIVAN) 0.5 MG tablet Take 1 tablet (0.5 mg total) by mouth daily as needed (Panic attack).  . [DISCONTINUED] traMADol (ULTRAM) 50 MG tablet Take 0.5 tablets (25 mg total) by mouth every 8 (eight) hours as needed for severe pain.  . [DISCONTINUED] Vitamin D, Cholecalciferol, 25 MCG (1000 UT) TABS Take 5,000 Units by mouth daily.    No facility-administered encounter medications on file as of 07/27/2020.    Review of Systems  Constitutional: Negative for chills and fever.  HENT: Negative for congestion and sore throat.   Eyes: Negative for blurred vision.  Respiratory: Negative for cough and shortness of breath.   Cardiovascular: Negative for chest pain, palpitations and leg swelling.  Gastrointestinal: Negative for abdominal pain, blood  in stool, constipation, diarrhea and melena.  Genitourinary: Negative for dysuria.       No urinary concerns at this time  Musculoskeletal: Negative for falls and joint pain.  Skin: Positive for itching.       Of left arm  Neurological: Positive for weakness. Negative for dizziness, loss of consciousness and headaches.  Psychiatric/Behavioral: Positive for depression. The patient is nervous/anxious. The patient does not have insomnia.        Confusion, delusional states    Immunization History  Administered Date(s) Administered  . Influenza, High Dose Seasonal PF 07/01/2018  . Influenza,inj,Quad PF,6+ Mos 05/20/2013, 05/26/2014, 06/18/2015, 06/18/2016, 06/29/2017  . Influenza-Unspecified 06/20/2019, 06/05/2020, 06/05/2020  . Moderna Sars-Covid-2 Vaccination 08/19/2019, 10/08/2019  . PPD Test 08/23/2019  . Td 08/18/1998  . Tdap 03/11/2012   Pertinent  Health Maintenance Due  Topic Date Due  . PAP SMEAR-Modifier  Never  done  . MAMMOGRAM  Never done  . COLONOSCOPY  Never done  . INFLUENZA VACCINE  Completed   Fall Risk  05/31/2020 04/19/2020  Falls in the past year? 0 0  Risk for fall due to : No Fall Risks No Fall Risks  Follow up Falls evaluation completed Falls evaluation completed   Functional Status Survey:    Vitals:   07/27/20 1553  BP: 136/79  Pulse: 89  Temp: 97.7 F (36.5 C)  Weight: 129 lb 3.2 oz (58.6 kg)  Height: $Remove'5\' 5"'IBxwIJn$  (1.651 m)   Body mass index is 21.5 kg/m. Physical Exam Vitals reviewed.  Constitutional:      General: She is not in acute distress.    Appearance: She is not toxic-appearing.  HENT:     Right Ear: External ear normal.     Left Ear: External ear normal.  Eyes:     Conjunctiva/sclera: Conjunctivae normal.     Pupils: Pupils are equal, round, and reactive to light.  Cardiovascular:     Rate and Rhythm: Normal rate and regular rhythm.  Pulmonary:     Effort: Pulmonary effort is normal.  Abdominal:     General: Bowel sounds are  normal.  Skin:    Comments: STAGE 4 PRESSURE INJURY TO SACRUM: measures 6.0 x 4.5 x 1.5cm, total undermining length: 7.0cm; total undermining width measures 7.5cm. Small amount of serous exudate note. No indications of infection noted, no erythema, warmth or induration. Periwound intact. No periwound maceration noted. Denies pain/discomfort.   Neurological:     Mental Status: She is alert.     Motor: Weakness present.     Comments: Weak, working on bed mobility and logrolling  Psychiatric:     Comments: Mood like it's been for some time now--tears up easily and gets anxious and upset, has to be calmed and redirected     Labs reviewed: Recent Labs    07/08/20 0915 07/09/20 0410 07/10/20 0350 07/11/20 0408 07/12/20 0430 07/13/20 0334 07/16/20 0000 07/17/20 0000 07/21/20 0000 07/23/20 0000  NA 140   < > 142 144 144 144   < > 145 148* 145  K 3.2*   < > 3.2* 3.6 3.8 3.6   < > 4.2 3.3* 3.4  CL 109   < > 110 111 111 111   < > 113* 114* 108  CO2 28   < > $R'24 27 26 26   'pp$ < > 23* 25* 22  GLUCOSE 80   < > 129* 79 84 82  --   --   --   --   BUN 10   < > 10 6* 6* 6*   < > $R'20 9 9  'mn$ CREATININE 0.50   < > 0.66 0.50 0.60 0.65   < > 0.6 0.5 0.6  CALCIUM 7.9*   < > 7.8* 8.1* 8.2* 8.1*   < > 8.0* 7.7* 8.3*  MG 1.7  --  2.0  --  2.1  --   --   --  1.9  --   PHOS 2.4*  --   --   --   --   --   --   --   --   --    < > = values in this interval not displayed.   Recent Labs    07/11/20 0408 07/12/20 0430 07/13/20 0334  AST 10* 14* 16  ALT $Re'7 9 9  'Byl$ ALKPHOS 56 64 63  BILITOT 0.5 0.4 0.5  PROT 4.1* 4.6*  4.3*  ALBUMIN 1.6* 1.7* 1.6*   Recent Labs    07/07/20 0102 07/07/20 0605 07/08/20 0915 07/09/20 0410 07/11/20 0408 07/12/20 0430 07/13/20 0334 07/16/20 0000 07/17/20 0000 07/23/20 0000  WBC 14.6* 18.1* 12.3*   < > 7.2 8.3 6.7 7.9 10.6 9.5  NEUTROABS 11.6* 14.3* 9.8*  --   --   --   --   --   --   --   HGB 7.8* 8.9* 8.1*   < > 7.6* 8.1* 7.8* 8.0* 10.3* 10.5*  HCT 26.4* 29.5* 27.5*   <  > 25.9* 27.2* 26.3* 25* 33* 34*  MCV 85.7 85.3 84.9   < > 86.9 85.5 87.1  --   --   --   PLT 465* 630* 517*   < > 402* 418* 384 355 379 507*   < > = values in this interval not displayed.   No results found for: TSH No results found for: HGBA1C Lab Results  Component Value Date   CHOL 163 05/23/2020   HDL 51 05/23/2020   LDLCALC 50 05/23/2020   TRIG 309 (A) 05/23/2020    Significant Diagnostic Results in last 30 days:  CT ABDOMEN PELVIS W CONTRAST  Result Date: 07/08/2020 CLINICAL DATA:  Abdominal abscess/infection suspected. Sacral wound infection. Back pain. EXAM: CT ABDOMEN AND PELVIS WITH CONTRAST TECHNIQUE: Multidetector CT imaging of the abdomen and pelvis was performed using the standard protocol following bolus administration of intravenous contrast. CONTRAST:  OMNIPAQUE IOHEXOL 300 MG/ML  SOLN COMPARISON:  CT dated 10/03/2019 FINDINGS: Lower chest: There are trace bilateral pleural effusions, left greater than right. There is bibasilar atelectasis, left worse than right.The heart size is normal. Hepatobiliary: The liver is normal. Normal gallbladder.There is no biliary ductal dilation. Pancreas: Normal contours without ductal dilatation. No peripancreatic fluid collection. Spleen: Unremarkable. Adrenals/Urinary Tract: --Adrenal glands: Unremarkable. --Right kidney/ureter: There is severe right-sided hydroureteronephrosis to the level of the urinary bladder. --Left kidney/ureter: There is severe left-sided hydronephrosis to the level of the urinary bladder. There are areas of cortical thinning and scarring. The left-sided collecting system is partially duplicated. --Urinary bladder: The urinary bladder is trabeculated with multiple bladder diverticula. There is diffuse bladder wall thickening with adjacent fat stranding. Stomach/Bowel: --Stomach/Duodenum: No hiatal hernia or other gastric abnormality. Normal duodenal course and caliber. --Small bowel: Unremarkable. --Colon: There is  a large amount of stool throughout the colon. --Appendix: Normal. Vascular/Lymphatic: Atherosclerotic calcification is present within the non-aneurysmal abdominal aorta, without hemodynamically significant stenosis. There is a questionable filling defect within the right common femoral vein (axial series 2, image 75). --No retroperitoneal lymphadenopathy. --No mesenteric lymphadenopathy. --No pelvic or inguinal lymphadenopathy. Reproductive: Unremarkable Other: There is mild body wall edema. There are few pockets of subcutaneous gas on the patient's low anterior left abdomen favored to represent sequela of a prior subcutaneous injection. Musculoskeletal. 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 There is presacral edema which is likely reactive. These findings are new since the patient's CT dated 10/03/2019. IMPRESSION: 1. 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 These findings are new since the patient's CT dated 10/03/2019. 2. There is a questionable filling defect within the right common femoral vein, concerning for DVT. Recommend further evaluation with a dedicated right lower extremity venous Doppler ultrasound. 3. Severe bilateral hydroureteronephrosis to the level of the urinary bladder. This is likely secondary to chronic bladder outlet obstruction. 4. Diffuse  bladder wall thickening with adjacent fat stranding is concerning for cystitis. Correlation with urinalysis is recommended. 5. Trace bilateral pleural effusions, left greater than right, with bibasilar atelectasis, left worse than right. 6. Large amount of stool throughout the colon. Aortic Atherosclerosis (ICD10-I70.0). Electronically Signed   By: Constance Holster M.D.   On: 07/08/2020 22:47   VAS [place] LOWER EXTREMITY VENOUS (DVT)  Result Date: 07/09/2020  Lower Venous DVT Study Indications:  Swelling.  Risk Factors: Immobility. Limitations: Poor ultrasound/tissue interface and patient positioning, patient involuntary movement. Comparison Study: No prior studies. Performing Technologist: Oliver Hum RVT  Examination Guidelines: A complete evaluation includes B-mode imaging, spectral Doppler, color Doppler, and power Doppler as needed of all accessible portions of each vessel. Bilateral testing is considered an integral part of a complete examination. Limited examinations for reoccurring indications may be performed as noted. The reflux portion of the exam is performed with the patient in reverse Trendelenburg.  +---------+---------------+---------+-----------+----------+--------------+ RIGHT    CompressibilityPhasicitySpontaneityPropertiesThrombus Aging +---------+---------------+---------+-----------+----------+--------------+ CFV      Full           Yes      Yes                                 +---------+---------------+---------+-----------+----------+--------------+ SFJ      Full                                                        +---------+---------------+---------+-----------+----------+--------------+ FV Prox  Full                                                        +---------+---------------+---------+-----------+----------+--------------+ FV Mid   Full                                                        +---------+---------------+---------+-----------+----------+--------------+ FV DistalFull                                                        +---------+---------------+---------+-----------+----------+--------------+ PFV      Full                                                        +---------+---------------+---------+-----------+----------+--------------+ POP      Full           Yes      Yes                                 +---------+---------------+---------+-----------+----------+--------------+ PTV      Full                                                         +---------+---------------+---------+-----------+----------+--------------+  PERO     Full                                                        +---------+---------------+---------+-----------+----------+--------------+   +---------+---------------+---------+-----------+----------+--------------+ LEFT     CompressibilityPhasicitySpontaneityPropertiesThrombus Aging +---------+---------------+---------+-----------+----------+--------------+ CFV      Full           Yes      Yes                                 +---------+---------------+---------+-----------+----------+--------------+ SFJ      Full                                                        +---------+---------------+---------+-----------+----------+--------------+ FV Prox  Full                                                        +---------+---------------+---------+-----------+----------+--------------+ FV Mid   Full                                                        +---------+---------------+---------+-----------+----------+--------------+ FV DistalFull                                                        +---------+---------------+---------+-----------+----------+--------------+ PFV      Full                                                        +---------+---------------+---------+-----------+----------+--------------+ POP      Full           Yes      Yes                                 +---------+---------------+---------+-----------+----------+--------------+ PTV      Full                                                        +---------+---------------+---------+-----------+----------+--------------+ PERO     Full                                                        +---------+---------------+---------+-----------+----------+--------------+  Summary: RIGHT: - There is no evidence of deep vein thrombosis in  the lower extremity. However, portions of this examination were limited- see technologist comments above.  - No cystic structure found in the popliteal fossa.  LEFT: - There is no evidence of deep vein thrombosis in the lower extremity. However, portions of this examination were limited- see technologist comments above.  - No cystic structure found in the popliteal fossa.  *See table(s) above for measurements and observations. Electronically signed by Monica Martinez MD on 07/09/2020 at 4:41:06 PM.    Final     Assessment/Plan 1. Care plan discussed with patient -regular rolling--nursing plan being implemented and notify niece if resident refusing to change positions to her side -complete abx for osteomyelitis -nutrition discussed adding the juven powder to a beverage of choice--loves dr. Malachi Bonds -ensure regular bathing routine -ensure tx of itching provided with cream -cont current therapy plans and eventual restorative therapy -cont abx with vanc for c diff--might try in new year zinplava if she continues to have problems with diarrhea since insurance and drug plan assistance could not cover at end of 2020 -avoid oversedation with pain medication--avoid hydrocodone that had a negative effect and use tramadol instead and reduced muscle relaxer doses   2. Acute osteomyelitis of coccyx ([facility]) -complete course of IV abx for 4 wks with unasyn, but stop date to be officially determined by ID -f/u with ID as planned 07/30/20 at 10am, Dr. Baxter Flattery  3. Sacral decubitus ulcer, stage IV ([place]) -cont air mattress, wound care per wound care nurse and team, improved appearance  -cont nutrition plan and ensure meals received timely==encouraged more protein -cont turning more consistently  4. Pain associated with wound -improved, cont tramadol, reduced baclofen  5. Recurrent colitis due to Clostridium difficile -cont oral vanc therapy as preventive--she had not been having loose stools when the  osteomyelitis was discovered   6. 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 Delirium -continues to have some of this and some of her confusion seems anxiety-related b/c she gets very upset and then loses her train of thought -talks about have fugue states where she is somewhere else and is now agreeable to psych referral here (beyond Education officer, museum talking with her) even though this might have a cost -Education officer, museum also providing medicaid information  Anxiety disorder:  Still has panic attacks easly and we've been managing this with lorazepam at those times, also on both remeron for appetite an her baseline prozac--monitor for serotonin syndrome s/s  Family/ staff Communication: d/w care team (below), resident and her niece, Ms. Wiliams  Labs/tests ordered:  No new added today  65 mins spent with patient during care plan meeting with staff, resident and niece (niece non-face-to-face--was on phone)--DON, RN, Renee Huang, OT, RD, SW, and MDS; 30 mins on care plan meeting portion and 35 mins on medical mgt  Haani Bakula L. Ashiyah Pavlak, D.O. [facility] [address], [place] [address] Cell Phone (Mon-Fri 8am-5pm):  [phone] On Call:  [phone] & follow prompts after 5pm & weekends Office Phone:  [phone] Office Fax:  [phone]

## 2020-07-30 ENCOUNTER — Ambulatory Visit: Payer: Medicare Other | Admitting: Internal Medicine

## 2020-07-30 ENCOUNTER — Encounter: Payer: Self-pay | Admitting: Orthopedic Surgery

## 2020-07-30 ENCOUNTER — Non-Acute Institutional Stay (SKILLED_NURSING_FACILITY): Payer: Medicare Other | Admitting: Orthopedic Surgery

## 2020-07-30 DIAGNOSIS — A0471 Enterocolitis due to Clostridium difficile, recurrent: Secondary | ICD-10-CM

## 2020-07-30 DIAGNOSIS — M8618 Other acute osteomyelitis, other site: Secondary | ICD-10-CM

## 2020-07-30 DIAGNOSIS — M4628 Osteomyelitis of vertebra, sacral and sacrococcygeal region: Secondary | ICD-10-CM

## 2020-07-30 DIAGNOSIS — I10 Essential (primary) hypertension: Secondary | ICD-10-CM

## 2020-07-30 DIAGNOSIS — R634 Abnormal weight loss: Secondary | ICD-10-CM

## 2020-07-30 DIAGNOSIS — G35B0 Primary progressive multiple sclerosis, unspecified: Secondary | ICD-10-CM

## 2020-07-30 DIAGNOSIS — G35 Multiple sclerosis: Secondary | ICD-10-CM

## 2020-07-30 DIAGNOSIS — F410 Panic disorder [episodic paroxysmal anxiety] without agoraphobia: Secondary | ICD-10-CM

## 2020-07-30 DIAGNOSIS — F320 Major depressive disorder, single episode, mild: Secondary | ICD-10-CM

## 2020-07-30 DIAGNOSIS — T148XXA Other injury of unspecified body region, initial encounter: Secondary | ICD-10-CM | POA: Diagnosis not present

## 2020-07-30 DIAGNOSIS — N319 Neuromuscular dysfunction of bladder, unspecified: Secondary | ICD-10-CM

## 2020-07-30 DIAGNOSIS — R52 Pain, unspecified: Secondary | ICD-10-CM

## 2020-07-30 DIAGNOSIS — E7800 Pure hypercholesterolemia, unspecified: Secondary | ICD-10-CM

## 2020-07-30 LAB — POCT ERYTHROCYTE SEDIMENTATION RATE, NON-AUTOMATED: Sed Rate: 52

## 2020-07-30 NOTE — Progress Notes (Signed)
Location:    [facility] Room Number: 210/W Place of Service:  SNF (31) Provider: Windell Moulding NP   [name], DO  Patient Care Team: [name], DO as PCP - General (Geriatric Medicine) Rehab, [place] ([facility]) [place], Nelda Bucks, NP as Nurse Practitioner (Family Medicine) Carlyle Basques, MD as Consulting Physician (Infectious Diseases) Ala Bent, MD as Referring Physician (Neurology)  Extended Emergency Contact Information Primary Emergency Contact: [name] Home Phone: [phone] Mobile Phone: [phone] Relation: Sister Secondary Emergency Contact: [name] Home Phone: [phone] Mobile Phone: [phone] Relation: Niece  Code Status:  Full Code Goals of care: Advanced Directive information Advanced Directives 07/30/2020  Does Patient Have a Medical Advance Directive? Yes  Type of Advance Directive Out of facility DNR (pink MOST or yellow form)  Does patient want to make changes to medical advance directive? No - Patient declined  Copy of [place] in Chart? -  Pre-existing out of facility DNR order (yellow form or pink MOST form) Pink MOST form placed in chart (order not valid for inpatient use)     Chief Complaint  Patient presents with  . Medical Management of Chronic Issues    Routine Visit of Medical Management   . Quality Metric Gaps    Colonoscopy, Hep-C Screening, Pap-Smear    HPI:  Pt is a 64 y.o. female seen today for medical management of chronic diseases.    She is a resident of [facility], seen at bedside today. PMH includes: hypertension, recurrent colitis, primary progressive multiple sclerosis, acute encephalopathy, osteomyelitis of coccyx, hyperlipidemia, neurogenic bladder, depression and anxiety.   Today she is alert, eating breakfast. She is orientated to self, person, place and situation. Cannot state day of  week, but knows year. She can express needs and follow commands.   She continues to be followed by facility wound nurse and infectious disease for sacral osteomyelitis. Remains on IV ampicillin until 12/22. Scheduled to follow up with infectious disease today. Facility wound nurse continues daily dressing changes with Dakins soaked gauze and covered with foam dressing. Wound measures 6.0 x 4.5 x 1.5 at this time. She denies sacral pain or pain during dressing changes.   On 12/9 wound nurse reported yeast developing near perianal area. Diflucan 150 mg, one time dose and nystatin cream was ordered. Today perianal folds do not appear excoriated, wound nurse reports improvement.   She continues to be on oral vancomycin for C.diff until 12/22. At this time she denies diarrhea or abdominal pain. Remains on enteric precautions.   Recorded blood pressures are as follows:   12/13- 135/79  12/12- 140/90  12/11- 128/62  Recorded weights are as follows:   12/10- 127.2 lbs  11/15- 135 lbs  10/15- 144.4 lbs Continues to lose weight within the past few months. She states her appetite is poor and only will have a few bites to eat every meal. Consuella Lose, prostat and magic cup for calories and protein. Facility nurse reports she has refused her meal within the past week, meal alternative was offered by DON.   On 12/10 she requested psyche services. She states she is depressed and would like to talk with someone. Social work made referral to [employer].   No falls reported. Remains bed bound and total assist with ADL's. She is able to feed herself.   Facility nurse does not report any other concerns, vitals stable.  Past Medical History:  Diagnosis Date  . Actinic keratosis   . BCC (basal cell carcinoma of skin)   . Closed fracture of second metatarsal bone of right foot   . Closed fracture of third metatarsal bone of right foot with routine healing   . IBS (irritable bowel syndrome)   .  Multiple sclerosis ([place])   . Neurogenic bladder   . Rectal polyp    Tubular adenoma  . Recurrent UTI   . SCC (squamous cell carcinoma)    Past Surgical History:  Procedure Laterality Date  . BOTOX INJECTION    . BREAST BIOPSY    . COLONOSCOPY    . SKIN CANCER EXCISION      No Known Allergies  Allergies as of 07/30/2020   No Known Allergies     Medication List       Accurate as of July 30, 2020  4:38 PM. If you have any questions, ask your nurse or doctor.        STOP taking these medications   LORazepam 0.5 MG tablet Commonly known as: ATIVAN Stopped by: Yvonna Alanis, NP   traMADol 50 MG tablet Commonly known as: ULTRAM Stopped by: Yvonna Alanis, NP     TAKE these medications   acetaminophen 325 MG tablet Commonly known as: TYLENOL Take 650 mg by mouth every 6 (six) hours as needed. What changed: Another medication with the same name was removed. Continue taking this medication, and follow the directions you see here. Changed by: Yvonna Alanis, NP   ampicillin-sulbactam  IVPB Commonly known as: UNASYN Inject 3 g into the vein every 6 (six) hours for 26 days. Indication:  Osteomyelitis of coccyx First Dose: Yes Last Day of Therapy:  08/05/2020 Labs - Once weekly:  CBC/D and BMP, Labs - Every other week:  ESR and CRP Method of administration: Mini-Bag Plus / Gravity Method of administration may be changed at the discretion of home infusion pharmacist based upon assessment of the patient and/or caregiver's ability to self-administer the medication ordered.   baclofen 20 MG tablet Commonly known as: LIORESAL Take 20 mg by mouth 3 (three) times daily.   baclofen 10 MG tablet Commonly known as: LIORESAL Take 15 mg by mouth at bedtime.   BIOFREEZE EX Apply 5 % topically in the morning, at noon, in the evening, and at bedtime. Apply to Left Shoulder.   cetirizine 5 MG tablet Commonly known as: ZYRTEC Take 1 tablet (5 mg total) by mouth at bedtime.    Culturelle Caps Take by mouth. 10B Cell Capsule   dicyclomine 20 MG tablet Commonly known as: BENTYL Take 20 mg by mouth in the morning, at noon, in the evening, and at bedtime.   feeding supplement (PRO-STAT SUGAR FREE 64) Liqd Take 30 mLs by mouth in the morning and at bedtime.   ferrous sulfate 325 (65 FE) MG tablet Take 1 tablet (325 mg total) by mouth daily with breakfast.   FLUoxetine 40 MG capsule Commonly known as: PROZAC Take 1 capsule (40 mg total) by mouth daily.   folic acid 1 MG tablet Commonly known as: FOLVITE Take 1 tablet (1 mg total) by mouth daily.   hydrocortisone cream 1 % Apply 1 application topically every 12 (twelve) hours as needed for itching.   JUVEN PO Take 1 each by mouth in the morning and at bedtime.   mirtazapine 7.5 MG tablet Commonly known as: REMERON Take 7.5 mg by mouth at bedtime.   NON  FORMULARY Magic Cup with L/D meals d/t weight loss.   nystatin ointment Commonly known as: MYCOSTATIN Apply 1 application topically 2 (two) times daily. apply to perineal area/buttocks daily-yeast   ondansetron 4 MG tablet Commonly known as: ZOFRAN Take 4 mg by mouth in the morning, at noon, and at bedtime. Nausea and Vomiting   polyethylene glycol 17 g packet Commonly known as: MIRALAX / GLYCOLAX Take 17 g by mouth daily as needed for mild constipation.   potassium chloride 10 MEQ tablet Commonly known as: KLOR-CON Take 20 mEq by mouth daily.   pravastatin 40 MG tablet Commonly known as: PRAVACHOL Take 40 mg by mouth daily.   senna 8.6 MG Tabs tablet Commonly known as: SENOKOT Take 2 tablets (17.2 mg total) by mouth at bedtime.   tamsulosin 0.4 MG Caps capsule Commonly known as: FLOMAX Take 0.4 mg by mouth daily.   Vancomycin HCl 50 MG/ML Solr Take 2.5 mLs by mouth every 12 (twelve) hours. For 26 days   vitamin B-12 1000 MCG tablet Commonly known as: CYANOCOBALAMIN Take 1,000 mcg by mouth daily.   VITAMIN C PO Take 500 mg by  mouth 2 (two) times daily.   Vitamin D (Cholecalciferol) 25 MCG (1000 UT) Tabs Take 5,000 Units by mouth daily.       Review of Systems  Constitutional: Positive for unexpected weight change. Negative for activity change, appetite change and fever.       Malaise/fatigue  HENT: Negative for dental problem, sore throat and trouble swallowing.   Eyes: Negative for visual disturbance.  Respiratory: Negative for cough, shortness of breath and wheezing.   Cardiovascular: Negative for chest pain and leg swelling.  Gastrointestinal: Positive for diarrhea. Negative for abdominal pain, constipation and nausea.  Genitourinary: Negative for hematuria.       Indwelling catheter  Musculoskeletal: Negative for myalgias.  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 Negative for dizziness and headaches.  Psychiatric/Behavioral: Positive for confusion and dysphoric mood. Negative for sleep disturbance and suicidal ideas. The patient is not nervous/anxious.     Immunization History  Administered Date(s) Administered  . Influenza, High Dose Seasonal PF 07/01/2018  . Influenza,inj,Quad PF,6+ Mos 05/20/2013, 05/26/2014, 06/18/2015, 06/18/2016, 06/29/2017  . Influenza-Unspecified 06/20/2019, 06/05/2020, 06/05/2020  . Moderna Sars-Covid-2 Vaccination 08/19/2019, 10/08/2019  . PPD Test 08/23/2019  . Td 08/18/1998  . Tdap 03/11/2012   Pertinent  Health Maintenance Due  Topic Date Due  . PAP SMEAR-Modifier  Never done  . MAMMOGRAM  Never done  . COLONOSCOPY  Never done  . INFLUENZA VACCINE  Completed   Fall Risk  05/31/2020 04/19/2020  Falls in the past year? 0 0  Risk for fall due to : No Fall Risks No Fall Risks  Follow up Falls evaluation completed Falls evaluation completed   Functional Status Survey:    Vitals:   07/30/20 1623  BP: 135/79  Pulse: 70  Resp: 18  Temp: (!) 97.4 F (36.3 C)  SpO2: 98%  Weight: 127 lb 3.2 oz  (57.7 kg)  Height: $Remove'5\' 5"'KYbjqnH$  (1.651 m)   Body mass index is 21.17 kg/m. Physical Exam Vitals reviewed.  Constitutional:      General: She is not in acute distress.    Comments: Appears tired  HENT:     Head: Normocephalic.     Right Ear: There is no impacted cerumen.     Left Ear: There is no impacted cerumen.  Nose: Nose normal.     Mouth/Throat:     Mouth: Mucous membranes are moist.     Pharynx: No posterior oropharyngeal erythema.  Eyes:     Extraocular Movements: Extraocular movements intact.     Pupils: Pupils are equal, round, and reactive to light.  Cardiovascular:     Rate and Rhythm: Normal rate and regular rhythm.     Pulses: Normal pulses.     Heart sounds: Normal heart sounds. No murmur heard.   Pulmonary:     Effort: Pulmonary effort is normal. No respiratory distress.     Breath sounds: Normal breath sounds. No wheezing.  Abdominal:     General: Abdomen is flat. Bowel sounds are normal. There is no distension.     Palpations: Abdomen is soft.     Tenderness: There is no abdominal tenderness.  Musculoskeletal:     Right lower leg: No edema.     Left lower leg: No edema.     Comments: Limited ROM of extremities due to MS.   Lymphadenopathy:     Cervical: No cervical adenopathy.  Skin:    General: Skin is warm and dry.     Capillary Refill: Capillary refill takes less than 2 seconds.     Comments: Patient refused examination of sacral pressure wound. Stated" I was just seen by the wound nurse and do not want to turn again."Face appears pale.   Neurological:     General: No focal deficit present.     Mental Status: She is alert. Mental status is at baseline.     Motor: Weakness present.     Gait: Gait abnormal.  Psychiatric:        Attention and Perception: Attention normal.        Mood and Affect: Mood is depressed. Affect is flat.        Speech: Speech normal.        Behavior: Behavior normal.        Thought Content: Thought content normal.         Cognition and Memory: Memory is impaired.     Labs reviewed: Recent Labs    07/08/20 0915 07/09/20 0410 07/10/20 0350 07/11/20 0408 07/12/20 0430 07/13/20 0334 07/16/20 0000 07/17/20 0000 07/21/20 0000 07/23/20 0000  NA 140   < > 142 144 144 144   < > 145 148* 145  K 3.2*   < > 3.2* 3.6 3.8 3.6   < > 4.2 3.3* 3.4  CL 109   < > 110 111 111 111   < > 113* 114* 108  CO2 28   < > _0 < > 23* 25* 22  GLUCOSE 80   < > 129* 79 84 82  --   --   --   --   BUN 10   < > 10 6* 6* 6*   < > _1 CREATININE 0.50   < > 0.66 0.50 0.60 0.65   < > 0.6 0.5 0.6  CALCIUM 7.9*   < > 7.8* 8.1* 8.2* 8.1*   < > 8.0* 7.7* 8.3*  MG 1.7  --  2.0  --  2.1  --   --   --  1.9  --   PHOS 2.4*  --   --   --   --   --   --   --   --   --    < > = values in this  interval not displayed.   Recent Labs    07/11/20 0408 07/12/20 0430 07/13/20 0334  AST 10* 14* 16  ALT _0 ALKPHOS 56 64 63  BILITOT 0.5 0.4 0.5  PROT 4.1* 4.6* 4.3*  ALBUMIN 1.6* 1.7* 1.6*   Recent Labs    07/07/20 0102 07/07/20 0605 07/08/20 0915 07/09/20 0410 07/11/20 0408 07/12/20 0430 07/13/20 0334 07/16/20 0000 07/17/20 0000 07/23/20 0000  WBC 14.6* 18.1* 12.3*   < > 7.2 8.3 6.7 7.9 10.6 9.5  NEUTROABS 11.6* 14.3* 9.8*  --   --   --   --   --   --   --   HGB 7.8* 8.9* 8.1*   < > 7.6* 8.1* 7.8* 8.0* 10.3* 10.5*  HCT 26.4* 29.5* 27.5*   < > 25.9* 27.2* 26.3* 25* 33* 34*  MCV 85.7 85.3 84.9   < > 86.9 85.5 87.1  --   --   --   PLT 465* 630* 517*   < > 402* 418* 384 355 379 507*   < > = values in this interval not displayed.   No results found for: TSH No results found for: HGBA1C Lab Results  Component Value Date   CHOL 163 05/23/2020   HDL 51 05/23/2020   LDLCALC 50 05/23/2020   TRIG 309 (A) 05/23/2020    Significant Diagnostic Results in last 30 days:  CT ABDOMEN PELVIS W CONTRAST  Result Date: 07/08/2020 CLINICAL DATA:  Abdominal abscess/infection suspected. Sacral wound infection. Back pain.  EXAM: CT ABDOMEN AND PELVIS WITH CONTRAST TECHNIQUE: Multidetector CT imaging of the abdomen and pelvis was performed using the standard protocol following bolus administration of intravenous contrast. CONTRAST:  170m OMNIPAQUE IOHEXOL 300 MG/ML  SOLN COMPARISON:  CT dated 10/03/2019 FINDINGS: Lower chest: There are trace bilateral pleural effusions, left greater than right. There is bibasilar atelectasis, left worse than right.The heart size is normal. Hepatobiliary: The liver is normal. Normal gallbladder.There is no biliary ductal dilation. Pancreas: Normal contours without ductal dilatation. No peripancreatic fluid collection. Spleen: Unremarkable. Adrenals/Urinary Tract: --Adrenal glands: Unremarkable. --Right kidney/ureter: There is severe right-sided hydroureteronephrosis to the level of the urinary bladder. --Left kidney/ureter: There is severe left-sided hydronephrosis to the level of the urinary bladder. There are areas of cortical thinning and scarring. The left-sided collecting system is partially duplicated. --Urinary bladder: The urinary bladder is trabeculated with multiple bladder diverticula. There is diffuse bladder wall thickening with adjacent fat stranding. Stomach/Bowel: --Stomach/Duodenum: No hiatal hernia or other gastric abnormality. Normal duodenal course and caliber. --Small bowel: Unremarkable. --Colon: There is a large amount of stool throughout the colon. --Appendix: Normal. Vascular/Lymphatic: Atherosclerotic calcification is present within the non-aneurysmal abdominal aorta, without hemodynamically significant stenosis. There is a questionable filling defect within the right common femoral vein (axial series 2, image 75). --No retroperitoneal lymphadenopathy. --No mesenteric lymphadenopathy. --No pelvic or inguinal lymphadenopathy. Reproductive: Unremarkable Other: There is mild body wall edema. There are few pockets of subcutaneous gas on the patient's low anterior left abdomen  favored to represent sequela of a prior subcutaneous injection. Musculoskeletal. 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 There is presacral edema which is likely reactive. These findings are new since the patient's CT dated 10/03/2019. IMPRESSION: 1. 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 These findings are new since the patient's CT dated 10/03/2019. 2. There is a questionable filling defect within the right  common femoral vein, concerning for DVT. Recommend further evaluation with a dedicated right lower extremity venous Doppler ultrasound. 3. Severe bilateral hydroureteronephrosis to the level of the urinary bladder. This is likely secondary to chronic bladder outlet obstruction. 4. Diffuse bladder wall thickening with adjacent fat stranding is concerning for cystitis. Correlation with urinalysis is recommended. 5. Trace bilateral pleural effusions, left greater than right, with bibasilar atelectasis, left worse than right. 6. Large amount of stool throughout the colon. Aortic Atherosclerosis (ICD10-I70.0). Electronically Signed   By: Constance Holster M.D.   On: 07/08/2020 22:47   DG Chest Port 1 View  Result Date: 07/07/2020 CLINICAL DATA:  Possible overdose. EXAM: PORTABLE CHEST 1 VIEW COMPARISON:  January 19, 2020 FINDINGS: A right-sided PICC line is seen with its distal tip noted at the junction of the superior vena cava and right atrium. There is no evidence of acute infiltrate. A small left pleural effusion is seen. No pneumothorax is identified. The heart size and mediastinal contours are within normal limits. The visualized skeletal structures are unremarkable. IMPRESSION: 1. Right-sided PICC line positioning, as described above. 2. Small left pleural effusion. Electronically Signed   By: Virgina Norfolk M.D.   On: 07/07/2020 02:40   VAS [place] LOWER  EXTREMITY VENOUS (DVT)  Result Date: 07/09/2020  Lower Venous DVT Study Indications: Swelling.  Risk Factors: Immobility. Limitations: Poor ultrasound/tissue interface and patient positioning, patient involuntary movement. Comparison Study: No prior studies. Performing Technologist: Oliver Hum RVT  Examination Guidelines: A complete evaluation includes B-mode imaging, spectral Doppler, color Doppler, and power Doppler as needed of all accessible portions of each vessel. Bilateral testing is considered an integral part of a complete examination. Limited examinations for reoccurring indications may be performed as noted. The reflux portion of the exam is performed with the patient in reverse Trendelenburg.  +---------+---------------+---------+-----------+----------+--------------+ RIGHT    CompressibilityPhasicitySpontaneityPropertiesThrombus Aging +---------+---------------+---------+-----------+----------+--------------+ CFV      Full           Yes      Yes                                 +---------+---------------+---------+-----------+----------+--------------+ SFJ      Full                                                        +---------+---------------+---------+-----------+----------+--------------+ FV Prox  Full                                                        +---------+---------------+---------+-----------+----------+--------------+ FV Mid   Full                                                        +---------+---------------+---------+-----------+----------+--------------+ FV DistalFull                                                        +---------+---------------+---------+-----------+----------+--------------+  PFV      Full                                                        +---------+---------------+---------+-----------+----------+--------------+ POP      Full           Yes      Yes                                  +---------+---------------+---------+-----------+----------+--------------+ PTV      Full                                                        +---------+---------------+---------+-----------+----------+--------------+ PERO     Full                                                        +---------+---------------+---------+-----------+----------+--------------+   +---------+---------------+---------+-----------+----------+--------------+ LEFT     CompressibilityPhasicitySpontaneityPropertiesThrombus Aging +---------+---------------+---------+-----------+----------+--------------+ CFV      Full           Yes      Yes                                 +---------+---------------+---------+-----------+----------+--------------+ SFJ      Full                                                        +---------+---------------+---------+-----------+----------+--------------+ FV Prox  Full                                                        +---------+---------------+---------+-----------+----------+--------------+ FV Mid   Full                                                        +---------+---------------+---------+-----------+----------+--------------+ FV DistalFull                                                        +---------+---------------+---------+-----------+----------+--------------+ PFV      Full                                                        +---------+---------------+---------+-----------+----------+--------------+  POP      Full           Yes      Yes                                 +---------+---------------+---------+-----------+----------+--------------+ PTV      Full                                                        +---------+---------------+---------+-----------+----------+--------------+ PERO     Full                                                         +---------+---------------+---------+-----------+----------+--------------+     Summary: RIGHT: - There is no evidence of deep vein thrombosis in the lower extremity. However, portions of this examination were limited- see technologist comments above.  - No cystic structure found in the popliteal fossa.  LEFT: - There is no evidence of deep vein thrombosis in the lower extremity. However, portions of this examination were limited- see technologist comments above.  - No cystic structure found in the popliteal fossa.  *See table(s) above for measurements and observations. Electronically signed by Monica Martinez MD on 07/09/2020 at 4:41:06 PM.    Final     Assessment/Plan 1. Acute osteomyelitis of coccyx ([place]) - ongoing, followed by facility wound nurse and infectious disease - was not able to visualize wound today, will plan with facility wound nurse a time during future dressing change - continue IV ampicillin - continue applying Dakins gauze and covering with foam daily - continue juven, prostat,  air overlay mattress, and multivitamin to promote wound healing - continue weekly cbc/diff and bmp  2. Pain associated with wound - stable, denies pain at this time - continue facility tylenol protocol   3. Recurrent colitis due to Clostridium difficile - stable with oral vanc  4. Multiple sclerosis, primary progressive ([place]) - ongoing, recurrent C.diff and sacral wound have been a set back in therapy  5. Episodic paroxysmal anxiety disorder - no reported panic attacks or increased anxiety - xanax and lorazepam discontinued about 10 days ago  6. Current mild episode of major depressive disorder, unspecified whether recurrent ([place]) - ongoing, she requested psyche referral for depression - she denies having a plan to harm herself - suspect current health conditions and isolation have increased depression - will await recommendations from psyche referral - continue Prozac 40 mg PO daily  7.  Primary hypertension - stable without medication  8. Pure hypercholesterolemia - stable with pravastatin - may consider decreasing dose due to weight loss - lipid panel- future  9. Weight loss - suspect recent episode of depression has decreased her appetite - continue to weight patient weekly - continue magic cups with meals  10. Neurogenic bladder - continue foley with facility protocols for changes     Family/ staff Communication: Plan discussed with patient and facility nurse.   Labs/tests ordered:  none

## 2020-08-01 ENCOUNTER — Non-Acute Institutional Stay (SKILLED_NURSING_FACILITY): Payer: Medicare Other | Admitting: Orthopedic Surgery

## 2020-08-01 ENCOUNTER — Encounter: Payer: Self-pay | Admitting: Orthopedic Surgery

## 2020-08-01 DIAGNOSIS — F320 Major depressive disorder, single episode, mild: Secondary | ICD-10-CM

## 2020-08-01 DIAGNOSIS — L299 Pruritus, unspecified: Secondary | ICD-10-CM | POA: Diagnosis not present

## 2020-08-01 DIAGNOSIS — F410 Panic disorder [episodic paroxysmal anxiety] without agoraphobia: Secondary | ICD-10-CM | POA: Diagnosis not present

## 2020-08-01 DIAGNOSIS — R410 Disorientation, unspecified: Secondary | ICD-10-CM

## 2020-08-01 LAB — CBC AND DIFFERENTIAL
HCT: 30 — AB (ref 36–46)
Hemoglobin: 9.5 — AB (ref 12.0–16.0)
Platelets: 307 (ref 150–399)
WBC: 8.1

## 2020-08-01 LAB — BASIC METABOLIC PANEL
BUN: 7 (ref 4–21)
CO2: 28 — AB (ref 13–22)
Chloride: 113 — AB (ref 99–108)
Creatinine: 0.5 (ref <=1.1)
Glucose: 65
Potassium: 3.1 — AB (ref 3.4–5.3)
Sodium: 149 — AB (ref 137–147)

## 2020-08-01 LAB — COMPREHENSIVE METABOLIC PANEL
Calcium: 8.5 — AB (ref 8.7–10.7)
GFR calc Af Amer: >90
GFR calc non Af Amer: >90

## 2020-08-01 LAB — CBC: RBC: 3.48 — AB (ref 3.87–5.11)

## 2020-08-01 NOTE — Progress Notes (Signed)
Location:    [facility] Room Number: 210/W Place of Service:  SNF (31) Provider: Windell Moulding NP   [name], DO  Patient Care Team: [name], DO as PCP - General (Geriatric Medicine) [facility], [facility] ([place]) Edmond, Nelda Bucks, NP as Nurse Practitioner (Family Medicine) Carlyle Basques, MD as Consulting Physician (Infectious Diseases) Ala Bent, MD as Referring Physician (Neurology)  Extended Emergency Contact Information Primary Emergency Contact: [name] Home Phone: [phone] Mobile Phone: [phone] Relation: Sister Secondary Emergency Contact: [name] Home Phone: [phone] Mobile Phone: [phone] Relation: Niece  Code Status: Full Code  Goals of care: Advanced Directive information Advanced Directives 08/01/2020  Does Patient Have a Medical Advance Directive? Yes  Type of Advance Directive Out of facility DNR (pink MOST or yellow form)  Does patient want to make changes to medical advance directive? No - Patient declined  Copy of [place] in Chart? -  Pre-existing out of facility DNR order (yellow form or pink MOST form) Pink MOST form placed in chart (order not valid for inpatient use)     Chief Complaint  Patient presents with  . Acute Visit    Confusion, Refusing Medication    HPI:  Pt is a 64 y.o. female seen today for an acute visit for confusion.   She is a resident of [facility], seen at bedside today. 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   Facility nurse reports she has increased confusion and refusing to take her baclofen, dicyclomine and Zofran.    Wound nurse present for encounter.   Today she is having her sacral dressing changed by wound care  nurse. She is alert to self, person and place. Disorientated to time. Follows commands and expresses needs.   She wanted me to hold her hand during dressing change. States " my left shoulder itches, do not let go of my hand or I will itch more." Wound nurse applied some cortisone cream to her left arm prior to leaving room. Dannya did not think cream helped and became more anxious during encounter. PRN anxiety medication discontinued for increased lethargy last week.    In addition, I had to ask her to open her eyes during our conversation a few times. I asked her if she was still feeling depressed, she responded" wouldn't you in my state of health?"  Denies if she has a plan to hurt herself. Her tone of voice became frustrated when I asked this question. States " I am sick and tired of people asking me the same questions, do you not talk to eachother?" She remembers asking staff for a psyche consult last week. Did not cry during visit, but has many times in past. Receives prozac 40 mg daily.   Facility nurse does not report any other concerns, vitals stable. Referral for psyche consult made last week.    Past Medical History:  Diagnosis Date  . Actinic keratosis   . BCC (basal cell carcinoma of skin)   . Closed fracture of second metatarsal bone of right foot   . Closed fracture of third metatarsal bone of right foot with routine healing   . IBS (irritable bowel syndrome)   . Multiple sclerosis ([place])   . Neurogenic bladder   . Rectal polyp    Tubular adenoma  . Recurrent UTI   .  SCC (squamous cell carcinoma)    Past Surgical History:  Procedure Laterality Date  . BOTOX INJECTION    . BREAST BIOPSY    . COLONOSCOPY    . SKIN CANCER EXCISION      No Known Allergies  Allergies as of 08/01/2020   No Known Allergies     Medication List       Accurate as of August 01, 2020 10:42 AM. If you have any questions, ask your nurse or doctor.        acetaminophen 325 MG  tablet Commonly known as: TYLENOL Take 650 mg by mouth every 6 (six) hours as needed.   ampicillin-sulbactam  IVPB Commonly known as: UNASYN Inject 3 g into the vein every 6 (six) hours for 26 days. Indication:  Osteomyelitis of coccyx First Dose: Yes Last Day of Therapy:  08/05/2020 Labs - Once weekly:  CBC/D and BMP, Labs - Every other week:  ESR and CRP Method of administration: Mini-Bag Plus / Gravity Method of administration may be changed at the discretion of home infusion pharmacist based upon assessment of the patient and/or caregiver's ability to self-administer the medication ordered.   baclofen 20 MG tablet Commonly known as: LIORESAL Take 20 mg by mouth 3 (three) times daily.   baclofen 10 MG tablet Commonly known as: LIORESAL Take 15 mg by mouth at bedtime.   BIOFREEZE EX Apply 5 % topically in the morning, at noon, in the evening, and at bedtime. Apply to Left Shoulder.   cetirizine 5 MG tablet Commonly known as: ZYRTEC Take 1 tablet (5 mg total) by mouth at bedtime.   Culturelle Caps Take by mouth. 10B Cell Capsule   dicyclomine 20 MG tablet Commonly known as: BENTYL Take 20 mg by mouth in the morning, at noon, in the evening, and at bedtime.   feeding supplement (PRO-STAT SUGAR FREE 64) Liqd Take 30 mLs by mouth in the morning and at bedtime.   ferrous sulfate 325 (65 FE) MG tablet Take 1 tablet (325 mg total) by mouth daily with breakfast.   FLUoxetine 40 MG capsule Commonly known as: PROZAC Take 1 capsule (40 mg total) by mouth daily.   folic acid 1 MG tablet Commonly known as: FOLVITE Take 1 tablet (1 mg total) by mouth daily.   hydrocortisone cream 1 % Apply 1 application topically every 12 (twelve) hours as needed for itching.   JUVEN PO Take 1 each by mouth in the morning and at bedtime.   mirtazapine 7.5 MG tablet Commonly known as: REMERON Take 7.5 mg by mouth at bedtime.   NON FORMULARY Magic Cup with L/D meals d/t weight loss.    nystatin ointment Commonly known as: MYCOSTATIN Apply 1 application topically daily in the afternoon. apply to perineal area/buttocks daily-yeast   ondansetron 4 MG tablet Commonly known as: ZOFRAN Take 4 mg by mouth in the morning, at noon, and at bedtime. Nausea and Vomiting   polyethylene glycol 17 g packet Commonly known as: MIRALAX / GLYCOLAX Take 17 g by mouth daily as needed for mild constipation.   potassium chloride 10 MEQ tablet Commonly known as: KLOR-CON Take 20 mEq by mouth daily.   pravastatin 40 MG tablet Commonly known as: PRAVACHOL Take 40 mg by mouth daily.   senna 8.6 MG Tabs tablet Commonly known as: SENOKOT Take 2 tablets (17.2 mg total) by mouth at bedtime.   tamsulosin 0.4 MG Caps capsule Commonly known as: FLOMAX Take 0.4 mg by mouth daily.   Vancomycin  HCl 50 MG/ML Solr Take 2.5 mLs by mouth every 12 (twelve) hours. For 26 days   vitamin B-12 1000 MCG tablet Commonly known as: CYANOCOBALAMIN Take 1,000 mcg by mouth daily.   VITAMIN C PO Take 500 mg by mouth 2 (two) times daily.   Vitamin D-3 125 MCG (5000 UT) Tabs Take 1 tablet by mouth daily in the afternoon. What changed: Another medication with the same name was removed. Continue taking this medication, and follow the directions you see here. Changed by: Yvonna Alanis, NP       Review of Systems  Constitutional: Positive for appetite change and fatigue. Negative for activity change and unexpected weight change.  Respiratory: Negative for cough, shortness of breath and wheezing.   Cardiovascular: Negative for chest pain and leg swelling.  Skin:       Itching, sacral pressure wound  Psychiatric/Behavioral: Positive for confusion and dysphoric mood. Negative for sleep disturbance and suicidal ideas. The patient is nervous/anxious.     Immunization History  Administered Date(s) Administered  . Influenza, High Dose Seasonal PF 07/01/2018  . Influenza,inj,Quad PF,6+ Mos 05/20/2013,  05/26/2014, 06/18/2015, 06/18/2016, 06/29/2017  . Influenza-Unspecified 06/20/2019, 06/05/2020, 06/05/2020  . Moderna Sars-Covid-2 Vaccination 08/19/2019, 10/08/2019  . PPD Test 08/23/2019  . Td 08/18/1998  . Tdap 03/11/2012   Pertinent  Health Maintenance Due  Topic Date Due  . PAP SMEAR-Modifier  Never done  . MAMMOGRAM  Never done  . COLONOSCOPY  Never done  . INFLUENZA VACCINE  Completed   Fall Risk  05/31/2020 04/19/2020  Falls in the past year? 0 0  Risk for fall due to : No Fall Risks No Fall Risks  Follow up Falls evaluation completed Falls evaluation completed   Functional Status Survey:    Vitals:   08/01/20 1039  BP: 124/75  Pulse: 91  Resp: 19  Temp: (!) 97.5 F (36.4 C)  Weight: 127 lb 3.2 oz (57.7 kg)  Height: 5' 5" (1.651 m)  HC: 98" (248.9 cm)   Body mass index is 21.17 kg/m. Physical Exam Constitutional:      General: She is not in acute distress. Cardiovascular:     Rate and Rhythm: Normal rate and regular rhythm.     Pulses: Normal pulses.     Heart sounds: No murmur heard.   Pulmonary:     Effort: Pulmonary effort is normal. No respiratory distress.     Breath sounds: Normal breath sounds. No wheezing.  Skin:    General: Skin is warm and dry.     Capillary Refill: Capillary refill takes less than 2 seconds.     Comments: Dry skin throughout body. Skin surrounding sacral pressure wound excoriated. Sacral wound + granulation tissue in wound bed, no necrotic tissue. No odor. Patient tolerated dressing change well.   Neurological:     General: No focal deficit present.     Mental Status: She is alert. Mental status is at baseline.     Motor: Weakness present.     Gait: Gait abnormal.  Psychiatric:        Attention and Perception: Attention normal.        Mood and Affect: Mood is anxious. Affect is flat.        Cognition and Memory: Memory is impaired.     Labs reviewed: Recent Labs    07/08/20 0915 07/09/20 0410 07/10/20 0350  07/11/20 0408 07/12/20 0430 07/13/20 0334 07/16/20 0000 07/17/20 0000 07/21/20 0000 07/23/20 0000  NA 140   < >  142 144 144 144   < > 145 148* 145  K 3.2*   < > 3.2* 3.6 3.8 3.6   < > 4.2 3.3* 3.4  CL 109   < > 110 111 111 111   < > 113* 114* 108  CO2 28   < > _0 < > 23* 25* 22  GLUCOSE 80   < > 129* 79 84 82  --   --   --   --   BUN 10   < > 10 6* 6* 6*   < > _1 CREATININE 0.50   < > 0.66 0.50 0.60 0.65   < > 0.6 0.5 0.6  CALCIUM 7.9*   < > 7.8* 8.1* 8.2* 8.1*   < > 8.0* 7.7* 8.3*  MG 1.7  --  2.0  --  2.1  --   --   --  1.9  --   PHOS 2.4*  --   --   --   --   --   --   --   --   --    < > = values in this interval not displayed.   Recent Labs    07/11/20 0408 07/12/20 0430 07/13/20 0334  AST 10* 14* 16  ALT _2 ALKPHOS 56 64 63  BILITOT 0.5 0.4 0.5  PROT 4.1* 4.6* 4.3*  ALBUMIN 1.6* 1.7* 1.6*   Recent Labs    07/07/20 0102 07/07/20 0605 07/08/20 0915 07/09/20 0410 07/11/20 0408 07/12/20 0430 07/13/20 0334 07/16/20 0000 07/17/20 0000 07/23/20 0000  WBC 14.6* 18.1* 12.3*   < > 7.2 8.3 6.7 7.9 10.6 9.5  NEUTROABS 11.6* 14.3* 9.8*  --   --   --   --   --   --   --   HGB 7.8* 8.9* 8.1*   < > 7.6* 8.1* 7.8* 8.0* 10.3* 10.5*  HCT 26.4* 29.5* 27.5*   < > 25.9* 27.2* 26.3* 25* 33* 34*  MCV 85.7 85.3 84.9   < > 86.9 85.5 87.1  --   --   --   PLT 465* 630* 517*   < > 402* 418* 384 355 379 507*   < > = values in this interval not displayed.   No results found for: TSH No results found for: HGBA1C Lab Results  Component Value Date   CHOL 163 05/23/2020   HDL 51 05/23/2020   LDLCALC 50 05/23/2020   TRIG 309 (A) 05/23/2020    Significant Diagnostic Results in last 30 days:  CT ABDOMEN PELVIS W CONTRAST  Result Date: 07/08/2020 CLINICAL DATA:  Abdominal abscess/infection suspected. Sacral wound infection. Back pain. EXAM: CT ABDOMEN AND PELVIS WITH CONTRAST TECHNIQUE: Multidetector CT imaging of the abdomen and pelvis was performed using the  standard protocol following bolus administration of intravenous contrast. CONTRAST:  169m OMNIPAQUE IOHEXOL 300 MG/ML  SOLN COMPARISON:  CT dated 10/03/2019 FINDINGS: Lower chest: There are trace bilateral pleural effusions, left greater than right. There is bibasilar atelectasis, left worse than right.The heart size is normal. Hepatobiliary: The liver is normal. Normal gallbladder.There is no biliary ductal dilation. Pancreas: Normal contours without ductal dilatation. No peripancreatic fluid collection. Spleen: Unremarkable. Adrenals/Urinary Tract: --Adrenal glands: Unremarkable. --Right kidney/ureter: There is severe right-sided hydroureteronephrosis to the level of the urinary bladder. --Left kidney/ureter: There is severe left-sided hydronephrosis to the level of the urinary bladder. There are areas of cortical thinning and scarring. The left-sided  collecting system is partially duplicated. --Urinary bladder: The urinary bladder is trabeculated with multiple bladder diverticula. There is diffuse bladder wall thickening with adjacent fat stranding. Stomach/Bowel: --Stomach/Duodenum: No hiatal hernia or other gastric abnormality. Normal duodenal course and caliber. --Small bowel: Unremarkable. --Colon: There is a large amount of stool throughout the colon. --Appendix: Normal. Vascular/Lymphatic: Atherosclerotic calcification is present within the non-aneurysmal abdominal aorta, without hemodynamically significant stenosis. There is a questionable filling defect within the right common femoral vein (axial series 2, image 75). --No retroperitoneal lymphadenopathy. --No mesenteric lymphadenopathy. --No pelvic or inguinal lymphadenopathy. Reproductive: Unremarkable Other: There is mild body wall edema. There are few pockets of subcutaneous gas on the patient's low anterior left abdomen favored to represent sequela of a prior subcutaneous injection. Musculoskeletal. There is a large sacral decubitus ulcer with  findings consistent with osteomyelitis involving the underlying coccyx. There is presacral edema which is likely reactive. These findings are new since the patient's CT dated 10/03/2019. IMPRESSION: 1. 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 These findings are new since the patient's CT dated 10/03/2019. 2. There is a questionable filling defect within the right common femoral vein, concerning for DVT. Recommend further evaluation with a dedicated right lower extremity venous Doppler ultrasound. 3. Severe bilateral hydroureteronephrosis to the level of the urinary bladder. This is likely secondary to chronic bladder outlet obstruction. 4. Diffuse bladder wall thickening with adjacent fat stranding is concerning for cystitis. Correlation with urinalysis is recommended. 5. Trace bilateral pleural effusions, left greater than right, with bibasilar atelectasis, left worse than right. 6. Large amount of stool throughout the colon. Aortic Atherosclerosis (ICD10-I70.0). Electronically Signed   By: Constance Holster M.D.   On: 07/08/2020 22:47   DG Chest Port 1 View  Result Date: 07/07/2020 CLINICAL DATA:  Possible overdose. EXAM: PORTABLE CHEST 1 VIEW COMPARISON:  January 19, 2020 FINDINGS: A right-sided PICC line is seen with its distal tip noted at the junction of the superior vena cava and right atrium. There is no evidence of acute infiltrate. A small left pleural effusion is seen. No pneumothorax is identified. The heart size and mediastinal contours are within normal limits. The visualized skeletal structures are unremarkable. IMPRESSION: 1. Right-sided PICC line positioning, as described above. 2. Small left pleural effusion. Electronically Signed   By: Virgina Norfolk M.D.   On: 07/07/2020 02:40   VAS [place] LOWER EXTREMITY VENOUS (DVT)  Result Date: 07/09/2020  Lower Venous DVT Study Indications: Swelling.  Risk Factors: Immobility.  Limitations: Poor ultrasound/tissue interface and patient positioning, patient involuntary movement. Comparison Study: No prior studies. Performing Technologist: Oliver Hum RVT  Examination Guidelines: A complete evaluation includes B-mode imaging, spectral Doppler, color Doppler, and power Doppler as needed of all accessible portions of each vessel. Bilateral testing is considered an integral part of a complete examination. Limited examinations for reoccurring indications may be performed as noted. The reflux portion of the exam is performed with the patient in reverse Trendelenburg.  +---------+---------------+---------+-----------+----------+--------------+ RIGHT    CompressibilityPhasicitySpontaneityPropertiesThrombus Aging +---------+---------------+---------+-----------+----------+--------------+ CFV      Full           Yes      Yes                                 +---------+---------------+---------+-----------+----------+--------------+ SFJ      Full                                                        +---------+---------------+---------+-----------+----------+--------------+  FV Prox  Full                                                        +---------+---------------+---------+-----------+----------+--------------+ FV Mid   Full                                                        +---------+---------------+---------+-----------+----------+--------------+ FV DistalFull                                                        +---------+---------------+---------+-----------+----------+--------------+ PFV      Full                                                        +---------+---------------+---------+-----------+----------+--------------+ POP      Full           Yes      Yes                                 +---------+---------------+---------+-----------+----------+--------------+ PTV      Full                                                         +---------+---------------+---------+-----------+----------+--------------+ PERO     Full                                                        +---------+---------------+---------+-----------+----------+--------------+   +---------+---------------+---------+-----------+----------+--------------+ LEFT     CompressibilityPhasicitySpontaneityPropertiesThrombus Aging +---------+---------------+---------+-----------+----------+--------------+ CFV      Full           Yes      Yes                                 +---------+---------------+---------+-----------+----------+--------------+ SFJ      Full                                                        +---------+---------------+---------+-----------+----------+--------------+ FV Prox  Full                                                        +---------+---------------+---------+-----------+----------+--------------+  FV Mid   Full                                                        +---------+---------------+---------+-----------+----------+--------------+ FV DistalFull                                                        +---------+---------------+---------+-----------+----------+--------------+ PFV      Full                                                        +---------+---------------+---------+-----------+----------+--------------+ POP      Full           Yes      Yes                                 +---------+---------------+---------+-----------+----------+--------------+ PTV      Full                                                        +---------+---------------+---------+-----------+----------+--------------+ PERO     Full                                                        +---------+---------------+---------+-----------+----------+--------------+     Summary: RIGHT: - There is no evidence of deep vein thrombosis in the lower extremity. However,  portions of this examination were limited- see technologist comments above.  - No cystic structure found in the popliteal fossa.  LEFT: - There is no evidence of deep vein thrombosis in the lower extremity. However, portions of this examination were limited- see technologist comments above.  - No cystic structure found in the popliteal fossa.  *See table(s) above for measurements and observations. Electronically signed by Monica Martinez MD on 07/09/2020 at 4:41:06 PM.    Final     Assessment/Plan 1. Episodic paroxysmal anxiety disorder - appears anxious during encounter, upset if I let her hand go due to itching - will restart PRN anxiety medication - ativan 0.5 mg PO PRN daily for anxiety  2. Current mild episode of major depressive disorder, unspecified whether recurrent ([place]) - ongoing, she denies plans to hurt self - suspect current condition of health has made her depression reoccur - psyche consult made, awaiting to schedule patient - will increase prozac to help with depression - Prozac 60 mg PO daily  3. Pruritus - suspect itching is associated with anxiety since she is very anxious during encounter - benadryl 25 mg PO BID for itching - continue to apply lotion to skin after bathing  4. Confusion - ongoing, she has been  worked up for delirium within last month - she is alert and responsive today   Pharmacist, hospital Communication: Plan discussed with patient and facility nurse  Labs/tests ordered:  none

## 2020-08-06 LAB — CBC AND DIFFERENTIAL
HCT: 33 — AB (ref 36–46)
Hemoglobin: 11 — AB (ref 12.0–16.0)
Platelets: 319 (ref 150–399)
WBC: 10.6

## 2020-08-06 LAB — BASIC METABOLIC PANEL
BUN: 4 (ref 4–21)
CO2: 25 — AB (ref 13–22)
Chloride: 110 — AB (ref 99–108)
Creatinine: 0.6 (ref 0.5–1.1)
Glucose: 112
Potassium: 4.1 (ref 3.4–5.3)
Sodium: 144 (ref 137–147)

## 2020-08-06 LAB — COMPREHENSIVE METABOLIC PANEL
Calcium: 8.8 (ref 8.7–10.7)
GFR calc Af Amer: >90
GFR calc non Af Amer: >90

## 2020-08-06 LAB — CBC: RBC: 3.9 (ref 3.87–5.11)

## 2020-08-08 ENCOUNTER — Other Ambulatory Visit: Payer: Self-pay

## 2020-08-08 ENCOUNTER — Telehealth (INDEPENDENT_AMBULATORY_CARE_PROVIDER_SITE_OTHER): Payer: Medicare Other | Admitting: Internal Medicine

## 2020-08-08 DIAGNOSIS — R197 Diarrhea, unspecified: Secondary | ICD-10-CM

## 2020-08-09 ENCOUNTER — Non-Acute Institutional Stay (SKILLED_NURSING_FACILITY): Payer: Medicare Other | Admitting: Orthopedic Surgery

## 2020-08-09 ENCOUNTER — Encounter: Payer: Self-pay | Admitting: Orthopedic Surgery

## 2020-08-09 DIAGNOSIS — I10 Essential (primary) hypertension: Secondary | ICD-10-CM

## 2020-08-09 DIAGNOSIS — N319 Neuromuscular dysfunction of bladder, unspecified: Secondary | ICD-10-CM

## 2020-08-09 DIAGNOSIS — F411 Generalized anxiety disorder: Secondary | ICD-10-CM

## 2020-08-09 DIAGNOSIS — M4628 Osteomyelitis of vertebra, sacral and sacrococcygeal region: Secondary | ICD-10-CM

## 2020-08-09 DIAGNOSIS — A0471 Enterocolitis due to Clostridium difficile, recurrent: Secondary | ICD-10-CM

## 2020-08-09 DIAGNOSIS — R634 Abnormal weight loss: Secondary | ICD-10-CM

## 2020-08-09 DIAGNOSIS — F320 Major depressive disorder, single episode, mild: Secondary | ICD-10-CM

## 2020-08-09 DIAGNOSIS — G35 Multiple sclerosis: Secondary | ICD-10-CM

## 2020-08-09 DIAGNOSIS — M8618 Other acute osteomyelitis, other site: Secondary | ICD-10-CM

## 2020-08-09 DIAGNOSIS — E7800 Pure hypercholesterolemia, unspecified: Secondary | ICD-10-CM

## 2020-08-09 DIAGNOSIS — L89154 Pressure ulcer of sacral region, stage 4: Secondary | ICD-10-CM
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
## 2020-08-16 ENCOUNTER — Ambulatory Visit: Payer: Medicare Other

## 2020-10-08 ENCOUNTER — Encounter: Payer: Self-pay | Admitting: Internal Medicine

## 2020-11-16 DEATH — deceased

## 2021-01-03 IMAGING — CT CT ABD-PELV W/ CM
2 of 5 series · 15 of 46 positions shown, 17 images · IV contrast (OMNIPAQUE 300)
Comparison: CT dated 10/03/2019

CLINICAL DATA: Abdominal abscess/infection suspected. Sacral wound
infection. Back pain.

EXAM:
CT ABDOMEN AND PELVIS WITH CONTRAST
TECHNIQUE: Multidetector CT imaging of the abdomen and pelvis was performed
using the standard protocol following bolus administration of
intravenous contrast.
CONTRAST:  100mL OMNIPAQUE IOHEXOL 300 MG/ML  SOLN

[Series 2: axial st · axial · 0.79mm/px · z∈[-478,-78]mm · 12 of 94 slices shown, 14 images]
[im 7/94  soft-tissue]
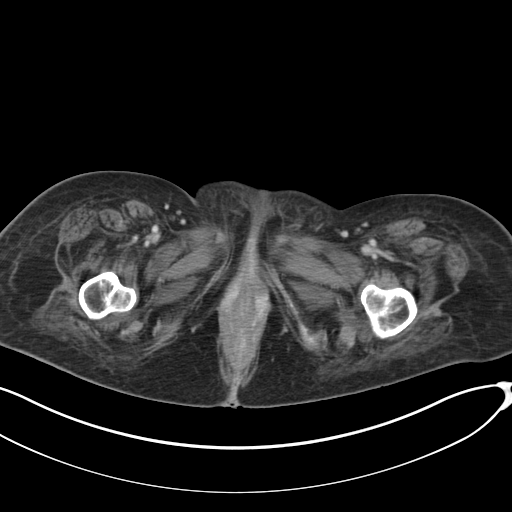
[im 7/94  bone]
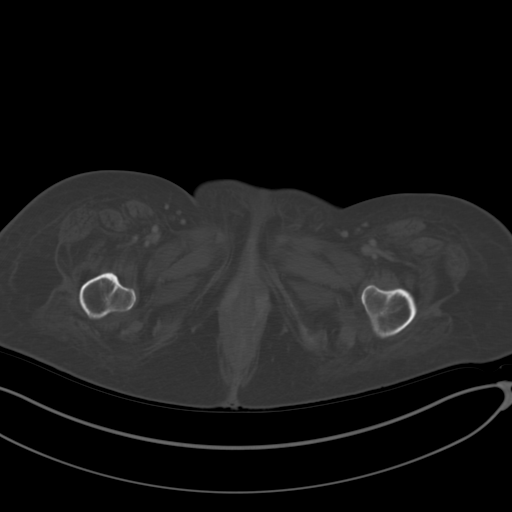
[im 13/94  soft-tissue]
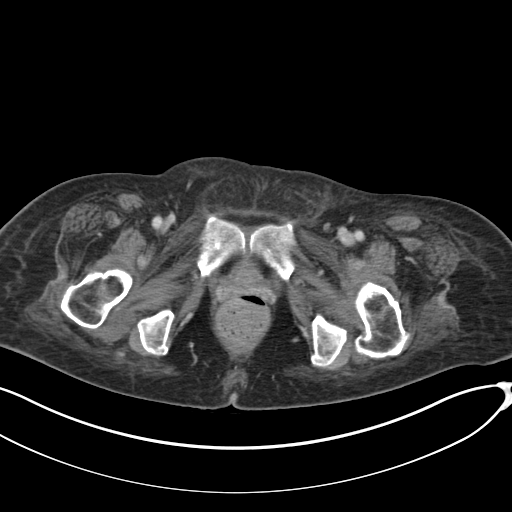
[im 19/94  soft-tissue]
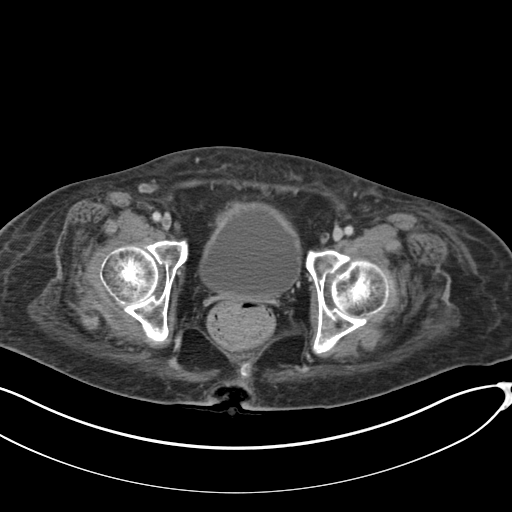
[im 32/94  soft-tissue]
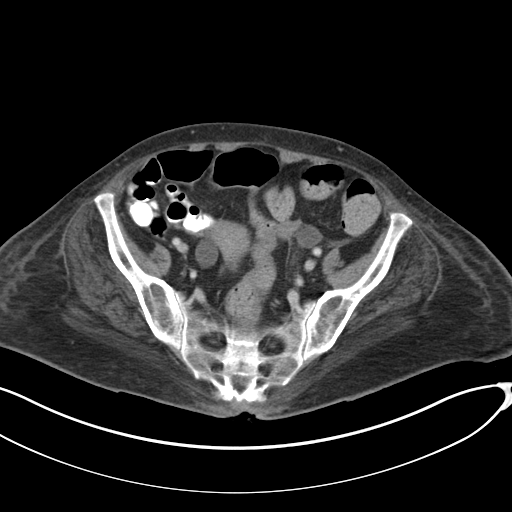
[im 38/94  soft-tissue]
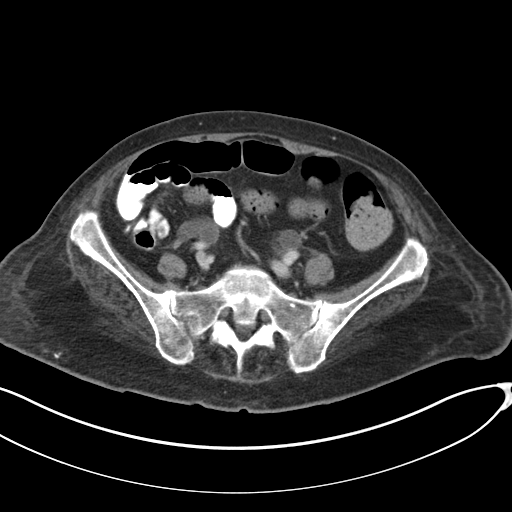
[im 44/94  soft-tissue]
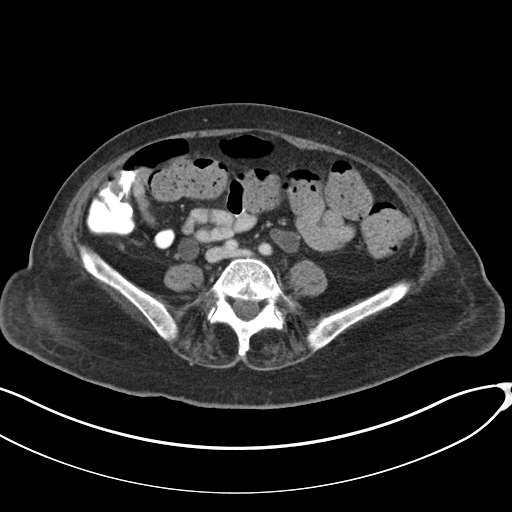
[im 50/94  soft-tissue]
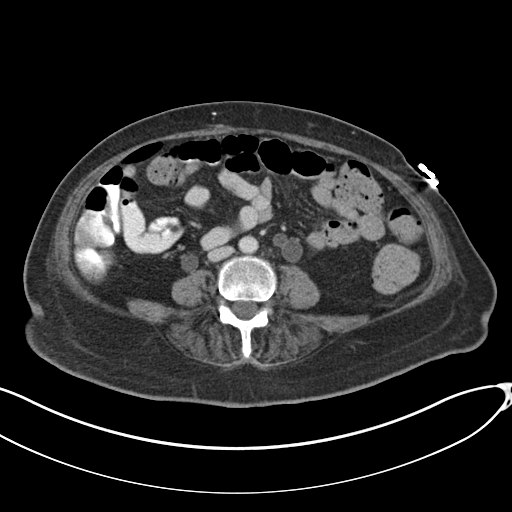
[im 56/94  soft-tissue]
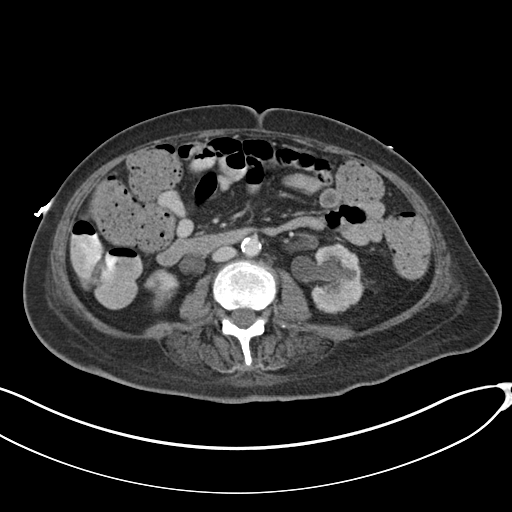
[im 63/94  soft-tissue]
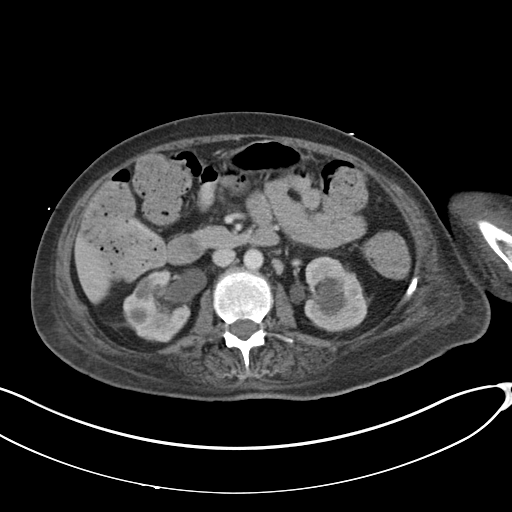
[im 63/94  bone]
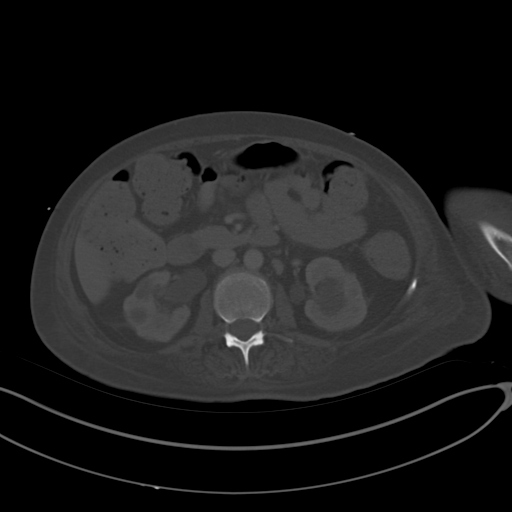
[im 75/94  soft-tissue]
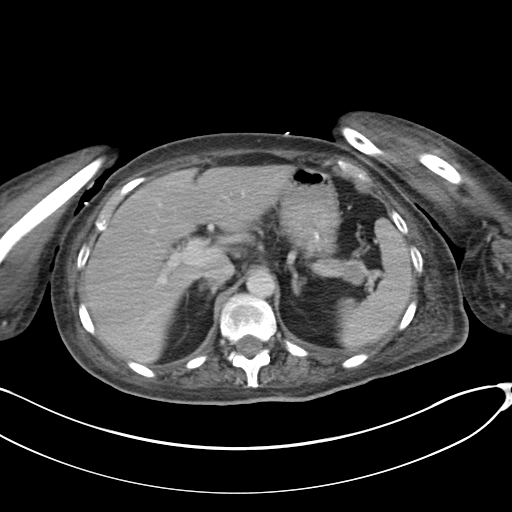
[im 81/94  soft-tissue]
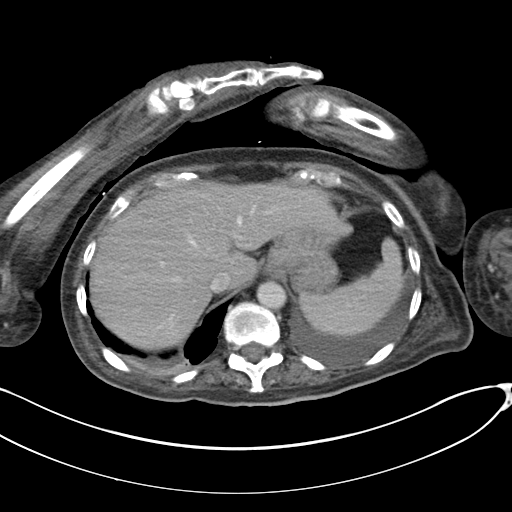
[im 87/94  soft-tissue]
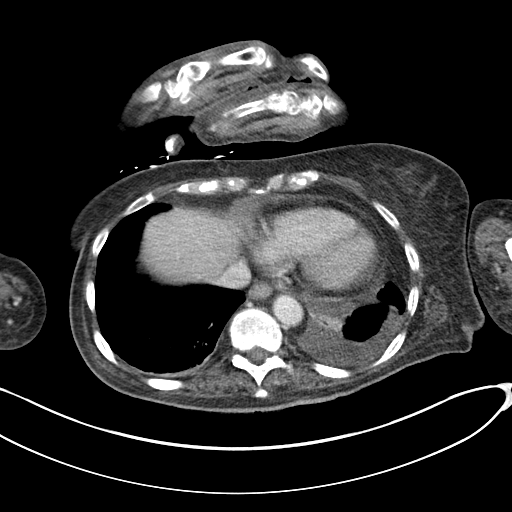

[Series 5: coronal st · coronal · 0.76mm/px · 3 of 151 slices shown]
[im 51/151  soft-tissue]
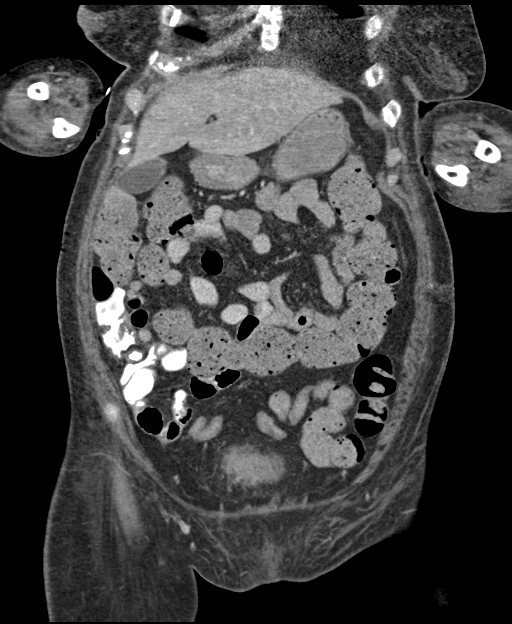
[im 67/151  soft-tissue]
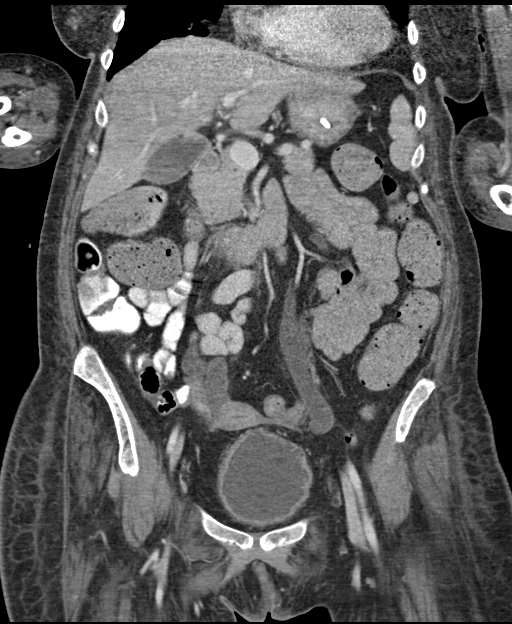
[im 84/151  soft-tissue]
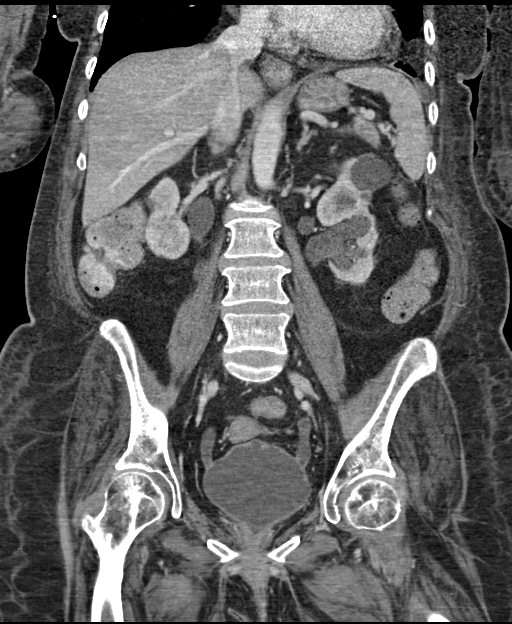

[15 of 46 positions shown; findings below may reference images not displayed]

FINDINGS: Lower chest: There are trace bilateral pleural effusions, left
greater than right. There is bibasilar atelectasis, left worse than
right.The heart size is normal.

Hepatobiliary: The liver is normal. Normal gallbladder.There is no
biliary ductal dilation.

Pancreas: Normal contours without ductal dilatation. No
peripancreatic fluid collection.

Spleen: Unremarkable.

Adrenals/Urinary Tract:

--Adrenal glands: Unremarkable.

--Right kidney/ureter: There is severe right-sided
hydroureteronephrosis to the level of the urinary bladder.

--Left kidney/ureter: There is severe left-sided hydronephrosis to
the level of the urinary bladder. There are areas of cortical
thinning and scarring. The left-sided collecting system is partially
duplicated.

--Urinary bladder: The urinary bladder is trabeculated with multiple
bladder diverticula. There is diffuse bladder wall thickening with
adjacent fat stranding.

Stomach/Bowel:

--Stomach/Duodenum: No hiatal hernia or other gastric abnormality.
Normal duodenal course and caliber.

--Small bowel: Unremarkable.

--Colon: There is a large amount of stool throughout the colon.

--Appendix: Normal.

Vascular/Lymphatic: Atherosclerotic calcification is present within
the non-aneurysmal abdominal aorta, without hemodynamically
significant stenosis. There is a questionable filling defect within
the right common femoral vein (axial series 2, image 75).

--No retroperitoneal lymphadenopathy.

--No mesenteric lymphadenopathy.

--No pelvic or inguinal lymphadenopathy.

Reproductive: Unremarkable

Other: There is mild body wall edema. There are few pockets of
subcutaneous gas on the patient's low anterior left abdomen favored
to represent sequela of a prior subcutaneous injection.

Musculoskeletal. There is a large sacral decubitus ulcer with
findings consistent with osteomyelitis involving the underlying
coccyx. There is presacral edema which is likely reactive. These
findings are new since the patient's CT dated 10/03/2019.
IMPRESSION: 1. There is a large sacral decubitus ulcer with findings consistent
with osteomyelitis involving the underlying coccyx. These findings
are new since the patient's CT dated 10/03/2019.
2. There is a questionable filling defect within the right common
femoral vein, concerning for DVT. Recommend further evaluation with
a dedicated right lower extremity venous Doppler ultrasound.
3. Severe bilateral hydroureteronephrosis to the level of the
urinary bladder. This is likely secondary to chronic bladder outlet
obstruction.
4. Diffuse bladder wall thickening with adjacent fat stranding is
concerning for cystitis. Correlation with urinalysis is recommended.
5. Trace bilateral pleural effusions, left greater than right, with
bibasilar atelectasis, left worse than right.
6. Large amount of stool throughout the colon.

Aortic Atherosclerosis ([id number]).

## 2023-08-18 NOTE — Progress Notes (Signed)
Did not show up for visit/cancelled
